# Patient Record
Sex: Female | Born: 1970
Health system: Southern US, Community
[De-identification: ages and names within clinical notes are randomized; demographics above are authoritative.]

## PROBLEM LIST (undated history)

## (undated) DIAGNOSIS — C801 Malignant (primary) neoplasm, unspecified: Secondary | ICD-10-CM

## (undated) HISTORY — PX: ABDOMINAL HYSTERECTOMY: SHX81

## (undated) HISTORY — PX: TUBAL LIGATION: SHX77

## (undated) HISTORY — PX: CHOLECYSTECTOMY: SHX55

## (undated) NOTE — *Deleted (*Deleted)
Patient Care Team: Leone Payor, MD as PCP - General (Internal Medicine) Pershing Proud, RN as Oncology Nurse Navigator Donnelly Angelica, RN as Oncology Nurse Navigator Emelia Loron, MD as Consulting Physician (General Surgery) Serena Croissant, MD as Consulting Physician (Hematology and Oncology)  DIAGNOSIS: No diagnosis found.  SUMMARY OF ONCOLOGIC HISTORY: Oncology History  Malignant neoplasm of upper-outer quadrant of right breast in female, estrogen receptor positive (HCC)  06/26/2018 Initial Diagnosis   [place]: Screening mammogram detected focal asymmetry in the right breast, ultrasound revealed 2 cm oval mass: Biopsy revealed grade 1-2 invasive ductal carcinoma, ER 3+, PR 3+, HER-2 3+ positive T1c M0 stage Ia clinical stage   07/23/2018 Cancer Staging   Staging form: Breast, AJCC 8th Edition - Clinical stage from 07/23/2018: Stage IA (cT1c, cN0, cM0, G2, ER+, PR+, HER2+)   07/2018 Genetic Testing   Genetic testing in [place], [place], negative   08/20/2018 Surgery   Right lumpectomy Dwain Sarna) [id number]): Grade 2 IDC with DCIS, 2.2 cm, margins negative, 1/1 lymph node positive with focal extranodal extension, ER 50%, PR 50%, HER-2 positive, 3+, Ki-67 10 to 15%, T2N1   09/03/2018 Cancer Staging   Staging form: Breast, AJCC 8th Edition - Pathologic: Stage IB (pT2, pN1a, cM0, G2, ER+, PR+, HER2+) - Signed by Loa Socks, NP on 09/03/2018   10/04/2018 - 05/22/2019 Chemotherapy   palonosetron (ALOXI) injection 0.25 mg, 0.25 mg, Intravenous,  Once, 6 of 6 cycles Administration: 0.25 mg (10/15/2018), 0.25 mg (11/05/2018), 0.25 mg (12/17/2018), 0.25 mg (01/07/2019), 0.25 mg (11/26/2018), 0.25 mg (09/24/2018)  pegfilgrastim-cbqv (UDENYCA) injection 6 mg, 6 mg, Subcutaneous, Once, 6 of 6 cycles. Administration: 6 mg (09/25/2018), 6 mg (10/17/2018), 6 mg (11/06/2018), 6 mg (12/19/2018), 6 mg (01/08/2019), 6 mg (11/28/2018)  trastuzumab (HERCEPTIN) 504 mg in sodium chloride  0.9 % 250 mL chemo infusion, 8 mg/kg = 504 mg, Intravenous,  Once, 9 of 14 cycles. Dose modification: 6 mg/kg (original dose 6 mg/kg, Cycle 7, Reason: Other (see comments), Comment: Insurance will only approve Herceptin at this time.), 6 mg/kg (original dose 6 mg/kg, Cycle 12, Reason: Other (see comments), Comment: pt did not tolerate Kanjinti (itching)). Administration: 378 mg (10/15/2018), 378 mg (11/05/2018), 504 mg (09/24/2018), 378 mg (01/28/2019), 378 mg (02/18/2019), 378 mg (03/11/2019), 378 mg (04/01/2019), 378 mg (05/22/2019)  CARBOplatin (PARAPLATIN) 700 mg in sodium chloride 0.9 % 250 mL chemo infusion, 700 mg (100 % of original dose 700 mg), Intravenous,  Once, 6 of 6 cycles. Dose modification: 700 mg (original dose 700 mg, Cycle 1). Administration: 700 mg (10/15/2018), 700 mg (11/05/2018), 700 mg (12/17/2018), 700 mg (01/07/2019), 700 mg (11/26/2018), 700 mg (09/24/2018)  DOCEtaxel (TAXOTERE) 130 mg in sodium chloride 0.9 % 250 mL chemo infusion, 75 mg/m2 = 130 mg, Intravenous,  Once, 6 of 6 cycles. Administration: 130 mg (10/15/2018), 130 mg (11/05/2018), 130 mg (12/17/2018), 130 mg (01/07/2019), 130 mg (11/26/2018), 130 mg (09/24/2018)  pertuzumab (PERJETA) 420 mg in sodium chloride 0.9 % 250 mL chemo infusion, 420 mg (50 % of original dose 840 mg), Intravenous, Once, 12 of 17 cycles. Dose modification: 420 mg (original dose 840 mg, Cycle 1, Reason: Provider Judgment). Administration: 420 mg (10/15/2018), 420 mg (11/05/2018), 420 mg (12/17/2018), 420 mg (01/07/2019), 420 mg (01/28/2019), 420 mg (03/11/2019), 420 mg (04/01/2019), 420 mg (11/26/2018), 420 mg (02/18/2019), 420 mg (04/24/2019), 420 mg (05/22/2019), 420 mg (09/24/2018)  fosaprepitant (EMEND) 150 mg   dexamethasone (DECADRON) 12 mg in sodium chloride 0.9 % 145 mL IVPB, ,  Intravenous,  Once, 6 of 6 cycles. Administration:  (10/15/2018),  (11/05/2018),  (12/17/2018),  (01/07/2019),  (11/26/2018),  (09/24/2018)  trastuzumab-dkst (OGIVRI) 378 mg in sodium chloride 0.9 %  250 mL chemo infusion, 6 mg/kg = 378 mg (100 % of original dose 6 mg/kg), Intravenous,  Once, 3 of 3 cycles. Dose modification: 6 mg/kg (original dose 6 mg/kg, Cycle 4, Reason: Other (see comments), Comment: Biosimilar Conversion). Administration: 378 mg (11/26/2018), 378 mg (12/17/2018), 378 mg (01/07/2019)     - 03/23/2019 Radiation Therapy   Completed at [facility]   03/2019 - 03/2024 Anti-estrogen oral therapy   Tamoxifen     CHIEF COMPLIANT: Follow-up of right breast canceron tamoxifen, neratinib  INTERVAL HISTORY: Megan Acevedo is a 72 y.o. with above-mentioned history of HER-2 positive right breast cancer who underwent alumpectomy,adjuvant chemotherapy,radiation,Herceptin Perjeta maintenance,andis currently onantiestrogen therapy with tamoxifenand neratinib.She presents to the clinic todayfora toxicity check.     ALLERGIES:  has No Known Allergies.  MEDICATIONS:  Current Outpatient Medications  Medication Sig Dispense Refill  . acetaminophen (TYLENOL) 325 MG tablet Take 650 mg by mouth every 6 (six) hours as needed.    . benzoyl peroxide-erythromycin (BENZAMYCIN) gel Apply topically 2 (two) times daily. 23.3 g 0  . chlorpheniramine (CHLOR-TRIMETON) 4 MG tablet Take 4 mg by mouth 2 (two) times daily as needed for allergies.    . diphenoxylate-atropine (LOMOTIL) 2.5-0.025 MG tablet Take 1 tablet by mouth 4 (four) times daily as needed for diarrhea or loose stools. 30 tablet 2  . LATISSE 0.03 % ophthalmic solution Place 1 application into both eyes at bedtime. Place one drop on applicator and apply evenly along the skin of the upper eyelid at base of eyelashes once daily at bedtime; repeat procedure for second eye (use a clean applicator). 3 mL 6  . loperamide (IMODIUM) 2 MG capsule Take 2 capsules (4 mg) by mouth three times daily on days 1-14, then 2 caps (4 mg) two times daily on days 15-56, then take as needed days 57-365 for diarrhea or loose stools 150 capsule 3  .  Multiple Vitamins-Minerals (CENTRUM SILVER 50+WOMEN) TABS Take 1 each by mouth every morning.    . Neratinib Maleate (NERLYNX) 40 MG tablet Take 2 tabs daily. Take with food. 180 tablet 1  . oxybutynin (DITROPAN) 5 MG tablet Take 5 mg by mouth 3 (three) times daily.    . potassium chloride SA (KLOR-CON) 20 MEQ tablet Take 1 tablet (20 mEq total) by mouth daily for 10 days. 10 tablet 0  . tamoxifen (NOLVADEX) 10 MG tablet Take 1 tablet (10 mg total) by mouth 2 (two) times daily. 60 tablet 3   No current facility-administered medications for this visit.    PHYSICAL EXAMINATION: ECOG PERFORMANCE STATUS: {CHL ONC ECOG PS:[phone]}  There were no vitals filed for this visit. There were no vitals filed for this visit.  LABORATORY DATA:  I have reviewed the data as listed CMP Latest Ref Rng & Units 12/23/2019 12/16/2019 12/02/2019  Glucose 70 - 99 mg/dL 77 89 76  BUN 6 - 20 mg/dL 13 12 13   Creatinine 0.44 - 1.00 mg/dL 4.09 8.11 9.14  Sodium 135 - 145 mmol/L 140 139 143  Potassium 3.5 - 5.1 mmol/L 4.1 3.3(L) 4.4  Chloride 98 - 111 mmol/L 107 103 109  CO2 22 - 32 mmol/L 26 29 26   Calcium 8.9 - 10.3 mg/dL 9.4 9.2 9.7  Total Protein 6.5 - 8.1 g/dL 7.1 7.1 7.3  Total Bilirubin 0.3 -  1.2 mg/dL 0.4 0.7 0.6  Alkaline Phos 38 - 126 U/L 73 73 73  AST 15 - 41 U/L 13(L) 21 15  ALT 0 - 44 U/L 22 43 16    Lab Results  Component Value Date   WBC 5.6 12/23/2019   HGB 13.1 12/23/2019   HCT 39.8 12/23/2019   MCV 95.7 12/23/2019   PLT 274 12/23/2019   NEUTROABS 3.2 12/23/2019    ASSESSMENT & PLAN:  No problem-specific Assessment & Plan notes found for this encounter.    No orders of the defined types were placed in this encounter.  The patient has a good understanding of the overall plan. she agrees with it. she will call with any problems that may develop before the next visit here.  Total time spent: *** mins including face to face time and time spent for planning, charting and coordination  of care  Serena Croissant, MD 01/31/2020  I, Kirt Boys Dorshimer, am acting as scribe for Dr. Serena Croissant.  {insert scribe attestation}

---

## 2018-04-09 DIAGNOSIS — C50919 Malignant neoplasm of unspecified site of unspecified female breast: Secondary | ICD-10-CM

## 2018-04-09 DIAGNOSIS — Z923 Personal history of irradiation: Secondary | ICD-10-CM

## 2018-04-09 DIAGNOSIS — Z9221 Personal history of antineoplastic chemotherapy: Secondary | ICD-10-CM

## 2018-04-09 HISTORY — DX: Personal history of antineoplastic chemotherapy: Z92.21

## 2018-04-09 HISTORY — DX: Malignant neoplasm of unspecified site of unspecified female breast: C50.919

## 2018-04-09 HISTORY — DX: Personal history of irradiation: Z92.3

## 2018-06-26 HISTORY — PX: BREAST BIOPSY: SHX20

## 2018-07-22 ENCOUNTER — Telehealth: Payer: Self-pay | Admitting: Hematology and Oncology

## 2018-07-22 NOTE — Telephone Encounter (Signed)
A new patient appt has been scheduled for Megan Acevedo to see Dr. Lindi Adie on 4/15 at 1:15pm. She's been made aware to arrive 30 minutes early.

## 2018-07-22 NOTE — Progress Notes (Signed)
[facility] CONSULT NOTE  No care team member to display  CHIEF COMPLAINTS/PURPOSE OF CONSULTATION: Newly diagnosed breast cancer  HISTORY OF PRESENTING ILLNESS:  Megan Acevedo 48 y.o. female is here because of recent diagnosis of invasive ductal carcinoma of the right breast. The cancer was detected on a screening mammogram on 06/09/18. A diagnostic mammogram and [place] on 06/18/18 showed a 2cm mass in the upper right breast at the 9 o'clock position. A biopsy on 06/26/18 showed the cancer to be grade 1-2 IDC, ER/PR positive, HER2 positive.   She presents to the clinic today to discuss treatment options.   I reviewed her records extensively and collaborated the history with the patient.  SUMMARY OF ONCOLOGIC HISTORY:   Malignant neoplasm of upper-outer quadrant of right breast in female, estrogen receptor positive ([place])   06/26/2018 Initial Diagnosis    [place]: Screening mammogram detected focal asymmetry in the right breast, ultrasound revealed 2 cm oval mass: Biopsy revealed grade 1-2 invasive ductal carcinoma, ER 3+, PR 3+, HER-2 3+ positive T1c M0 stage Ia clinical stage    07/23/2018 Cancer Staging    Staging form: Breast, AJCC 8th Edition - Clinical stage from 07/23/2018: Stage IA (cT1c, cN0, cM0, G2, ER+, PR+, HER2+) - Signed by Nicholas Lose, MD on 07/23/2018     MEDICAL HISTORY:  No chronic medical conditions SURGICAL HISTORY: Hysterectomy, cholecystectomy, tubal ligation SOCIAL HISTORY: 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  Her husband is a respiratory therapist at [employer].  She denies any tobacco alcohol or recreational drug use. FAMILY HISTORY: Mother had breast cancer in her 68s. Both father and mother are diabetic and hypertensive ALLERGIES:  has no allergies on file.  MEDICATIONS:  REVIEW OF SYSTEMS:   Constitutional: Denies fevers, chills or abnormal night sweats Eyes: Denies blurriness of vision, double vision or  watery eyes Ears, nose, mouth, throat, and face: Denies mucositis or sore throat Respiratory: Denies cough, dyspnea or wheezes Cardiovascular: Denies palpitation, chest discomfort or lower extremity swelling Gastrointestinal:  Denies nausea, heartburn or change in bowel habits Skin: Denies abnormal skin rashes Lymphatics: Denies new lymphadenopathy or easy bruising Neurological:Denies numbness, tingling or new weaknesses Behavioral/Psych: Mood is stable, no new changes  Breast: Denies any palpable lumps or discharge All other systems were reviewed with the patient and are negative.  PHYSICAL EXAMINATION: ECOG PERFORMANCE STATUS: 1 - Symptomatic but completely ambulatory  Vitals:   07/23/18 1326  BP: (!) 148/82  Pulse: 84  Resp: 18  Temp: 97.8 F (36.6 C)  SpO2: 100%   Filed Weights   07/23/18 1326  Weight: 138 lb (62.6 kg)    GENERAL:alert, no distress and comfortable SKIN: skin color, texture, turgor are normal, no rashes or significant lesions EYES: normal, conjunctiva are pink and non-injected, sclera clear OROPHARYNX:no exudate, no erythema and lips, buccal mucosa, and tongue normal  NECK: supple, thyroid normal size, non-tender, without nodularity LYMPH:  no palpable lymphadenopathy in the cervical, axillary or inguinal LUNGS: clear to auscultation and percussion with normal breathing effort HEART: regular rate & rhythm and no murmurs and no lower extremity edema ABDOMEN:abdomen soft, non-tender and normal bowel sounds Musculoskeletal:no cyanosis of digits and no clubbing  PSYCH: alert & oriented x 3 with fluent speech NEURO: no focal motor/sensory deficits  RADIOGRAPHIC STUDIES: I have personally reviewed the radiological reports and agreed with the findings in the report.  ASSESSMENT AND PLAN:  Malignant neoplasm of upper-outer quadrant of right breast in  female, estrogen receptor positive ([place]) 06/26/2018 [place]: Screening mammogram detected focal  asymmetry in the right breast, ultrasound revealed 2 cm oval mass: Biopsy revealed grade 1-2 invasive ductal carcinoma, ER 3+, PR 3+, HER-2 3+ positive T1c M0 stage Ia clinical stage  Pathology and radiology counseling: Discussed with the patient, the details of pathology including the type of breast cancer,the clinical staging, the significance of ER, PR and HER-2/neu receptors and the implications for treatment. After reviewing the pathology in detail, we proceeded to discuss the different treatment options between surgery, radiation, chemotherapy, antiestrogen therapies.  Recommendation: 1.  Genetic counseling and testing: Done at [facility] were apparently negative 2.  Breast conserving surgery with sentinel lymph node biopsy 3.  Adjuvant chemotherapy decision based upon tumor size and nodal characteristics 4.  Adjuvant radiation therapy 5.  Follow-up adjuvant antiestrogen therapy with tamoxifen x10 years  I briefly discussed different chemotherapy regimens depending on the tumor size and lymph node involvement.  We will finalize a treatment after reviewing the final pathology report. Patient requested a referral to see one of our surgeons.  I sent a message to Dr. Donne Hazel for consultation. I discussed with the patient the surgery was to be delayed then we can start her on tamoxifen.  All questions were answered. The patient knows to call the clinic with any problems, questions or concerns.   Rulon Eisenmenger, MD 07/23/2018   I, Molly Dorshimer, am acting as scribe for Nicholas Lose, MD.  I have reviewed the above documentation for accuracy and completeness, and I agree with the above.

## 2018-07-23 ENCOUNTER — Inpatient Hospital Stay: Payer: BLUE CROSS/BLUE SHIELD | Attending: Hematology and Oncology | Admitting: Hematology and Oncology

## 2018-07-23 ENCOUNTER — Other Ambulatory Visit: Payer: Self-pay

## 2018-07-23 DIAGNOSIS — Z803 Family history of malignant neoplasm of breast: Secondary | ICD-10-CM | POA: Diagnosis not present

## 2018-07-23 DIAGNOSIS — Z79899 Other long term (current) drug therapy: Secondary | ICD-10-CM | POA: Diagnosis not present

## 2018-07-23 DIAGNOSIS — Z170 Estrogen receptor positive status [ER+]: Secondary | ICD-10-CM | POA: Diagnosis not present

## 2018-07-23 DIAGNOSIS — C50411 Malignant neoplasm of upper-outer quadrant of right female breast: Secondary | ICD-10-CM | POA: Diagnosis not present

## 2018-07-23 NOTE — Assessment & Plan Note (Signed)
06/26/2018 [place]: Screening mammogram detected focal asymmetry in the right breast, ultrasound revealed 2 cm oval mass: Biopsy revealed grade 1-2 invasive ductal carcinoma, ER 3+, PR 3+, HER-2 3+ positive T1c M0 stage Ia clinical stage  Pathology and radiology counseling: Discussed with the patient, the details of pathology including the type of breast cancer,the clinical staging, the significance of ER, PR and HER-2/neu receptors and the implications for treatment. After reviewing the pathology in detail, we proceeded to discuss the different treatment options between surgery, radiation, chemotherapy, antiestrogen therapies.  Recommendation: 1.  Genetic counseling and testing 2.  Breast conserving surgery with sentinel lymph node biopsy 3.  Adjuvant chemotherapy decision based upon tumor size and nodal characteristics 4.  Adjuvant radiation therapy 5.  Follow-up adjuvant antiestrogen therapy  I briefly discussed different chemotherapy regimens depending on the tumor size and lymph node involvement.  We will finalize a treatment after reviewing the final pathology report.

## 2018-07-24 ENCOUNTER — Encounter: Payer: Self-pay | Admitting: *Deleted

## 2018-07-25 ENCOUNTER — Telehealth: Payer: Self-pay | Admitting: *Deleted

## 2018-07-25 NOTE — Telephone Encounter (Signed)
Left vm with contact information for questions or needs regarding tx care plan.

## 2018-07-28 ENCOUNTER — Inpatient Hospital Stay: Payer: BLUE CROSS/BLUE SHIELD

## 2018-07-28 ENCOUNTER — Other Ambulatory Visit (HOSPITAL_COMMUNITY): Payer: BLUE CROSS/BLUE SHIELD

## 2018-07-29 ENCOUNTER — Other Ambulatory Visit: Payer: Self-pay | Admitting: Hematology and Oncology

## 2018-07-30 ENCOUNTER — Telehealth: Payer: Self-pay | Admitting: *Deleted

## 2018-07-30 NOTE — Telephone Encounter (Signed)
Pt called regarding echo. Pt to have echo in [place]. Informed pt to have office fax report to our office. Received verbal understanding.

## 2018-08-04 ENCOUNTER — Encounter: Payer: Self-pay | Admitting: Hematology and Oncology

## 2018-08-04 ENCOUNTER — Encounter: Payer: Self-pay | Admitting: *Deleted

## 2018-08-04 NOTE — Progress Notes (Signed)
On 4/16 slides were requested from [facility], to be sent to Dr. Lyndon Code @ [facility],  Faxed request to [phone], confirmation received,

## 2018-08-05 ENCOUNTER — Other Ambulatory Visit: Payer: Self-pay | Admitting: General Surgery

## 2018-08-05 DIAGNOSIS — Z170 Estrogen receptor positive status [ER+]: Principal | ICD-10-CM

## 2018-08-05 DIAGNOSIS — C50411 Malignant neoplasm of upper-outer quadrant of right female breast: Secondary | ICD-10-CM

## 2018-08-06 ENCOUNTER — Other Ambulatory Visit: Payer: Self-pay | Admitting: General Surgery

## 2018-08-06 DIAGNOSIS — C50411 Malignant neoplasm of upper-outer quadrant of right female breast: Secondary | ICD-10-CM

## 2018-08-06 DIAGNOSIS — Z170 Estrogen receptor positive status [ER+]: Principal | ICD-10-CM

## 2018-08-07 ENCOUNTER — Encounter: Payer: Self-pay | Admitting: *Deleted

## 2018-08-14 ENCOUNTER — Other Ambulatory Visit: Payer: Self-pay | Admitting: General Surgery

## 2018-08-14 ENCOUNTER — Other Ambulatory Visit: Payer: Self-pay

## 2018-08-14 ENCOUNTER — Encounter (HOSPITAL_BASED_OUTPATIENT_CLINIC_OR_DEPARTMENT_OTHER): Payer: Self-pay | Admitting: *Deleted

## 2018-08-18 ENCOUNTER — Other Ambulatory Visit: Payer: Self-pay

## 2018-08-18 ENCOUNTER — Other Ambulatory Visit (HOSPITAL_COMMUNITY)
Admission: RE | Admit: 2018-08-18 | Discharge: 2018-08-18 | Disposition: A | Discharge status: Home / Self Care | Payer: BLUE CROSS/BLUE SHIELD | Source: Ambulatory Visit | Attending: General Surgery | Admitting: General Surgery

## 2018-08-18 ENCOUNTER — Ambulatory Visit
Admission: RE | Admit: 2018-08-18 | Discharge: 2018-08-18 | Disposition: A | Discharge status: Home / Self Care | Payer: BLUE CROSS/BLUE SHIELD | Source: Ambulatory Visit | Attending: General Surgery | Admitting: General Surgery

## 2018-08-18 ENCOUNTER — Other Ambulatory Visit: Payer: Self-pay | Admitting: General Surgery

## 2018-08-18 DIAGNOSIS — Z1159 Encounter for screening for other viral diseases: Secondary | ICD-10-CM | POA: Insufficient documentation

## 2018-08-18 DIAGNOSIS — Z170 Estrogen receptor positive status [ER+]: Secondary | ICD-10-CM

## 2018-08-18 DIAGNOSIS — C50411 Malignant neoplasm of upper-outer quadrant of right female breast: Secondary | ICD-10-CM

## 2018-08-18 NOTE — Progress Notes (Signed)
Ensure pre surgery drink given with instructions to complete by 0900 dos, surgical soap given with instructions, pt verbalized understanding.

## 2018-08-19 LAB — NOVEL CORONAVIRUS, NAA (HOSP ORDER, SEND-OUT TO REF LAB; TAT 18-24 HRS): SARS-CoV-2, NAA: NOT DETECTED

## 2018-08-20 ENCOUNTER — Encounter (HOSPITAL_BASED_OUTPATIENT_CLINIC_OR_DEPARTMENT_OTHER)
Admission: RE | Disposition: A | Discharge status: Home / Self Care | Payer: Self-pay | Source: Home / Self Care | Attending: General Surgery

## 2018-08-20 ENCOUNTER — Ambulatory Visit (HOSPITAL_COMMUNITY)
Admission: RE | Admit: 2018-08-20 | Discharge: 2018-08-20 | Disposition: A | Discharge status: Home / Self Care | Payer: BLUE CROSS/BLUE SHIELD | Source: Ambulatory Visit | Attending: General Surgery | Admitting: General Surgery

## 2018-08-20 ENCOUNTER — Ambulatory Visit
Admission: RE | Admit: 2018-08-20 | Discharge: 2018-08-20 | Disposition: A | Discharge status: Home / Self Care | Payer: BLUE CROSS/BLUE SHIELD | Source: Ambulatory Visit | Attending: General Surgery | Admitting: General Surgery

## 2018-08-20 ENCOUNTER — Encounter (HOSPITAL_BASED_OUTPATIENT_CLINIC_OR_DEPARTMENT_OTHER): Payer: Self-pay | Admitting: *Deleted

## 2018-08-20 ENCOUNTER — Encounter (HOSPITAL_COMMUNITY): Payer: BLUE CROSS/BLUE SHIELD

## 2018-08-20 ENCOUNTER — Ambulatory Visit (HOSPITAL_BASED_OUTPATIENT_CLINIC_OR_DEPARTMENT_OTHER): Payer: BLUE CROSS/BLUE SHIELD | Admitting: Anesthesiology

## 2018-08-20 ENCOUNTER — Ambulatory Visit (HOSPITAL_COMMUNITY): Payer: BLUE CROSS/BLUE SHIELD

## 2018-08-20 ENCOUNTER — Other Ambulatory Visit: Payer: Self-pay

## 2018-08-20 ENCOUNTER — Ambulatory Visit (HOSPITAL_BASED_OUTPATIENT_CLINIC_OR_DEPARTMENT_OTHER)
Admission: RE | Admit: 2018-08-20 | Discharge: 2018-08-20 | Disposition: A | Discharge status: Home / Self Care | Payer: BLUE CROSS/BLUE SHIELD | Attending: General Surgery | Admitting: General Surgery

## 2018-08-20 DIAGNOSIS — Z170 Estrogen receptor positive status [ER+]: Secondary | ICD-10-CM | POA: Diagnosis not present

## 2018-08-20 DIAGNOSIS — C50911 Malignant neoplasm of unspecified site of right female breast: Secondary | ICD-10-CM | POA: Insufficient documentation

## 2018-08-20 DIAGNOSIS — C773 Secondary and unspecified malignant neoplasm of axilla and upper limb lymph nodes: Secondary | ICD-10-CM | POA: Diagnosis not present

## 2018-08-20 DIAGNOSIS — Z79899 Other long term (current) drug therapy: Secondary | ICD-10-CM | POA: Insufficient documentation

## 2018-08-20 DIAGNOSIS — Z95828 Presence of other vascular implants and grafts: Secondary | ICD-10-CM

## 2018-08-20 DIAGNOSIS — C50411 Malignant neoplasm of upper-outer quadrant of right female breast: Secondary | ICD-10-CM

## 2018-08-20 DIAGNOSIS — Z803 Family history of malignant neoplasm of breast: Secondary | ICD-10-CM | POA: Diagnosis not present

## 2018-08-20 HISTORY — PX: PORTACATH PLACEMENT: SHX2246

## 2018-08-20 HISTORY — PX: BREAST LUMPECTOMY: SHX2

## 2018-08-20 HISTORY — PX: BREAST LUMPECTOMY WITH RADIOACTIVE SEED AND SENTINEL LYMPH NODE BIOPSY: SHX6550

## 2018-08-20 HISTORY — DX: Malignant (primary) neoplasm, unspecified: C80.1

## 2018-08-20 SURGERY — BREAST LUMPECTOMY WITH RADIOACTIVE SEED AND SENTINEL LYMPH NODE BIOPSY
Anesthesia: Regional | Site: Chest | Laterality: Right

## 2018-08-20 MED ORDER — FENTANYL CITRATE (PF) 100 MCG/2ML IJ SOLN: 50.0000 ug | INTRAMUSCULAR | Status: DC | PRN

## 2018-08-20 MED ORDER — PROMETHAZINE HCL 25 MG/ML IJ SOLN
6.2500 mg | INTRAMUSCULAR | Status: DC | PRN
  Administered 2018-08-20: 6.25 mg via INTRAVENOUS

## 2018-08-20 MED ORDER — CEFAZOLIN SODIUM-DEXTROSE 2-4 GM/100ML-% IV SOLN
INTRAVENOUS | Status: AC
  Filled 2018-08-20: qty 100

## 2018-08-20 MED ORDER — TECHNETIUM TC 99M SULFUR COLLOID FILTERED
1.0000 | Freq: Once | INTRAVENOUS | Status: AC | PRN
  Administered 2018-08-20: 11:00:00 1 via INTRADERMAL

## 2018-08-20 MED ORDER — ACETAMINOPHEN 500 MG PO TABS
ORAL_TABLET | ORAL | Status: AC
  Filled 2018-08-20: qty 1

## 2018-08-20 MED ORDER — FENTANYL CITRATE (PF) 100 MCG/2ML IJ SOLN
INTRAMUSCULAR | Status: DC | PRN
  Administered 2018-08-20 (×2): 50 ug via INTRAVENOUS

## 2018-08-20 MED ORDER — OXYCODONE HCL 5 MG/5ML PO SOLN: 5.0000 mg | Freq: Once | ORAL | Status: DC | PRN

## 2018-08-20 MED ORDER — PHENYLEPHRINE 40 MCG/ML (10ML) SYRINGE FOR IV PUSH (FOR BLOOD PRESSURE SUPPORT)
PREFILLED_SYRINGE | INTRAVENOUS | Status: DC | PRN
  Administered 2018-08-20: 40 ug via INTRAVENOUS
  Administered 2018-08-20 (×4): 80 ug via INTRAVENOUS
  Administered 2018-08-20: 40 ug via INTRAVENOUS
  Administered 2018-08-20 (×3): 80 ug via INTRAVENOUS

## 2018-08-20 MED ORDER — DEXAMETHASONE SODIUM PHOSPHATE 10 MG/ML IJ SOLN
INTRAMUSCULAR | Status: AC
  Filled 2018-08-20: qty 1

## 2018-08-20 MED ORDER — DEXAMETHASONE SODIUM PHOSPHATE 10 MG/ML IJ SOLN
INTRAMUSCULAR | Status: DC | PRN
  Administered 2018-08-20: 5 mg via INTRAVENOUS

## 2018-08-20 MED ORDER — KETOROLAC TROMETHAMINE 15 MG/ML IJ SOLN
INTRAMUSCULAR | Status: AC
  Filled 2018-08-20: qty 1

## 2018-08-20 MED ORDER — ONDANSETRON HCL 4 MG/2ML IJ SOLN
INTRAMUSCULAR | Status: DC | PRN
  Administered 2018-08-20: 4 mg via INTRAVENOUS

## 2018-08-20 MED ORDER — GABAPENTIN 100 MG PO CAPS
100.0000 mg | ORAL_CAPSULE | ORAL | Status: AC
  Administered 2018-08-20: 09:00:00 100 mg via ORAL

## 2018-08-20 MED ORDER — MIDAZOLAM HCL 2 MG/2ML IJ SOLN
INTRAMUSCULAR | Status: AC
  Filled 2018-08-20: qty 2

## 2018-08-20 MED ORDER — GABAPENTIN 100 MG PO CAPS
ORAL_CAPSULE | ORAL | Status: AC
  Filled 2018-08-20: qty 1

## 2018-08-20 MED ORDER — FENTANYL CITRATE (PF) 100 MCG/2ML IJ SOLN
INTRAMUSCULAR | Status: AC
  Filled 2018-08-20: qty 2

## 2018-08-20 MED ORDER — ONDANSETRON HCL 4 MG/2ML IJ SOLN
INTRAMUSCULAR | Status: AC
  Filled 2018-08-20: qty 2

## 2018-08-20 MED ORDER — LACTATED RINGERS IV SOLN
INTRAVENOUS | Status: DC
  Administered 2018-08-20 (×2): via INTRAVENOUS

## 2018-08-20 MED ORDER — KETOROLAC TROMETHAMINE 30 MG/ML IJ SOLN: 30.0000 mg | Freq: Once | INTRAMUSCULAR | Status: DC | PRN

## 2018-08-20 MED ORDER — ENSURE PRE-SURGERY PO LIQD: 296.0000 mL | Freq: Once | ORAL | Status: DC

## 2018-08-20 MED ORDER — OXYCODONE HCL 5 MG PO TABS: 5.0000 mg | ORAL_TABLET | Freq: Once | ORAL | Status: DC | PRN

## 2018-08-20 MED ORDER — ROPIVACAINE HCL 5 MG/ML IJ SOLN
INTRAMUSCULAR | Status: DC | PRN
  Administered 2018-08-20: 30 mL via PERINEURAL

## 2018-08-20 MED ORDER — HEPARIN SOD (PORK) LOCK FLUSH 100 UNIT/ML IV SOLN
INTRAVENOUS | Status: DC | PRN
  Administered 2018-08-20: 500 [IU] via INTRAVENOUS

## 2018-08-20 MED ORDER — ACETAMINOPHEN 500 MG PO TABS
1000.0000 mg | ORAL_TABLET | ORAL | Status: AC
  Administered 2018-08-20: 1000 mg via ORAL

## 2018-08-20 MED ORDER — SODIUM CHLORIDE (PF) 0.9 % IJ SOLN
INTRAMUSCULAR | Status: AC
  Filled 2018-08-20: qty 10

## 2018-08-20 MED ORDER — CHLORHEXIDINE GLUCONATE CLOTH 2 % EX PADS: 6.0000 | MEDICATED_PAD | Freq: Once | CUTANEOUS | Status: DC

## 2018-08-20 MED ORDER — HYDROMORPHONE HCL 1 MG/ML IJ SOLN: 0.2500 mg | INTRAMUSCULAR | Status: DC | PRN

## 2018-08-20 MED ORDER — PROMETHAZINE HCL 25 MG/ML IJ SOLN
INTRAMUSCULAR | Status: AC
  Filled 2018-08-20: qty 1

## 2018-08-20 MED ORDER — CEFAZOLIN SODIUM-DEXTROSE 2-4 GM/100ML-% IV SOLN
2.0000 g | INTRAVENOUS | Status: AC
  Administered 2018-08-20: 12:00:00 2 g via INTRAVENOUS

## 2018-08-20 MED ORDER — MIDAZOLAM HCL 2 MG/2ML IJ SOLN
1.0000 mg | INTRAMUSCULAR | Status: DC | PRN
  Administered 2018-08-20: 2 mg via INTRAVENOUS

## 2018-08-20 MED ORDER — HEPARIN (PORCINE) IN NACL 2-0.9 UNITS/ML
INTRAMUSCULAR | Status: AC | PRN
  Administered 2018-08-20: 1

## 2018-08-20 MED ORDER — ACETAMINOPHEN 500 MG PO TABS
ORAL_TABLET | ORAL | Status: AC
  Filled 2018-08-20: qty 2

## 2018-08-20 MED ORDER — SCOPOLAMINE 1 MG/3DAYS TD PT72: 1.0000 | MEDICATED_PATCH | Freq: Once | TRANSDERMAL | Status: DC | PRN

## 2018-08-20 MED ORDER — PROPOFOL 10 MG/ML IV BOLUS
INTRAVENOUS | Status: DC | PRN
  Administered 2018-08-20: 200 mg via INTRAVENOUS

## 2018-08-20 MED ORDER — LIDOCAINE 2% (20 MG/ML) 5 ML SYRINGE
INTRAMUSCULAR | Status: AC
  Filled 2018-08-20: qty 5

## 2018-08-20 MED ORDER — KETOROLAC TROMETHAMINE 15 MG/ML IJ SOLN
15.0000 mg | INTRAMUSCULAR | Status: DC
  Administered 2018-08-20: 15 mg via INTRAVENOUS

## 2018-08-20 MED ORDER — OXYCODONE HCL 5 MG PO TABS: 5.0000 mg | ORAL_TABLET | Freq: Four times a day (QID) | ORAL | 0 refills | Status: DC | PRN

## 2018-08-20 MED ORDER — LIDOCAINE 2% (20 MG/ML) 5 ML SYRINGE
INTRAMUSCULAR | Status: DC | PRN
  Administered 2018-08-20: 60 mg via INTRAVENOUS

## 2018-08-20 SURGICAL SUPPLY — 70 items
APPLIER CLIP 9.375 MED OPEN (MISCELLANEOUS) ×3
BAG DECANTER FOR FLEXI CONT (MISCELLANEOUS) ×3 IMPLANT
BENZOIN TINCTURE PRP APPL 2/3 (GAUZE/BANDAGES/DRESSINGS) ×3 IMPLANT
BINDER BREAST LRG (GAUZE/BANDAGES/DRESSINGS) IMPLANT
BINDER BREAST MEDIUM (GAUZE/BANDAGES/DRESSINGS) ×3 IMPLANT
BINDER BREAST XLRG (GAUZE/BANDAGES/DRESSINGS) IMPLANT
BINDER BREAST XXLRG (GAUZE/BANDAGES/DRESSINGS) IMPLANT
BLADE SURG 11 STRL SS (BLADE) ×3 IMPLANT
BLADE SURG 15 STRL LF DISP TIS (BLADE) ×2 IMPLANT
BLADE SURG 15 STRL SS (BLADE) ×1
CANISTER SUC SOCK COL 7IN (MISCELLANEOUS) IMPLANT
CANISTER SUCT 1200ML W/VALVE (MISCELLANEOUS) ×3 IMPLANT
CHLORAPREP W/TINT 26 (MISCELLANEOUS) ×6 IMPLANT
CLIP APPLIE 9.375 MED OPEN (MISCELLANEOUS) ×2 IMPLANT
CLIP VESOCCLUDE SM WIDE 6/CT (CLIP) ×3 IMPLANT
COVER BACK TABLE REUSABLE LG (DRAPES) ×3 IMPLANT
COVER MAYO STAND REUSABLE (DRAPES) ×3 IMPLANT
COVER PROBE 5X48 (MISCELLANEOUS) ×1
COVER PROBE W GEL 5X96 (DRAPES) ×6 IMPLANT
COVER WAND RF STERILE (DRAPES) IMPLANT
DECANTER SPIKE VIAL GLASS SM (MISCELLANEOUS) IMPLANT
DERMABOND ADVANCED (GAUZE/BANDAGES/DRESSINGS) ×1
DERMABOND ADVANCED .7 DNX12 (GAUZE/BANDAGES/DRESSINGS) ×2 IMPLANT
DRAPE C-ARM 42X72 X-RAY (DRAPES) ×3 IMPLANT
DRAPE LAPAROSCOPIC ABDOMINAL (DRAPES) ×6 IMPLANT
DRAPE UTILITY XL STRL (DRAPES) ×6 IMPLANT
DRSG TEGADERM 4X4.75 (GAUZE/BANDAGES/DRESSINGS) IMPLANT
ELECT COATED BLADE 2.86 ST (ELECTRODE) ×3 IMPLANT
ELECT REM PT RETURN 9FT ADLT (ELECTROSURGICAL) ×3
ELECTRODE REM PT RTRN 9FT ADLT (ELECTROSURGICAL) ×2 IMPLANT
GAUZE SPONGE 4X4 12PLY STRL LF (GAUZE/BANDAGES/DRESSINGS) ×3 IMPLANT
GLOVE BIO SURGEON STRL SZ7 (GLOVE) ×6 IMPLANT
GLOVE BIOGEL PI IND STRL 7.5 (GLOVE) ×2 IMPLANT
GLOVE BIOGEL PI INDICATOR 7.5 (GLOVE) ×1
GOWN STRL REUS W/ TWL LRG LVL3 (GOWN DISPOSABLE) ×4 IMPLANT
GOWN STRL REUS W/TWL LRG LVL3 (GOWN DISPOSABLE) ×2
HEMOSTAT ARISTA ABSORB 3G PWDR (HEMOSTASIS) ×3 IMPLANT
ILLUMINATOR WAVEGUIDE N/F (MISCELLANEOUS) ×3 IMPLANT
IV KIT MINILOC 20X1 SAFETY (NEEDLE) IMPLANT
KIT CVR 48X5XPRB PLUP LF (MISCELLANEOUS) ×2 IMPLANT
KIT MARKER MARGIN INK (KITS) ×3 IMPLANT
KIT PORT POWER 8FR ISP CVUE (Port) ×3 IMPLANT
LIGHT WAVEGUIDE WIDE FLAT (MISCELLANEOUS) IMPLANT
NDL SAFETY ECLIPSE 18X1.5 (NEEDLE) IMPLANT
NEEDLE HYPO 18GX1.5 SHARP (NEEDLE)
NEEDLE HYPO 25X1 1.5 SAFETY (NEEDLE) ×3 IMPLANT
NS IRRIG 1000ML POUR BTL (IV SOLUTION) ×3 IMPLANT
PACK BASIN DAY SURGERY FS (CUSTOM PROCEDURE TRAY) ×3 IMPLANT
PENCIL BUTTON HOLSTER BLD 10FT (ELECTRODE) ×3 IMPLANT
SLEEVE SCD COMPRESS KNEE MED (MISCELLANEOUS) ×3 IMPLANT
SPONGE LAP 4X18 RFD (DISPOSABLE) ×6 IMPLANT
STRIP CLOSURE SKIN 1/2X4 (GAUZE/BANDAGES/DRESSINGS) ×3 IMPLANT
SUT ETHILON 2 0 FS 18 (SUTURE) IMPLANT
SUT MNCRL AB 4-0 PS2 18 (SUTURE) ×9 IMPLANT
SUT MON AB 5-0 PS2 18 (SUTURE) ×3 IMPLANT
SUT PROLENE 2 0 SH DA (SUTURE) ×3 IMPLANT
SUT SILK 2 0 SH (SUTURE) IMPLANT
SUT SILK 2 0 TIES 17X18 (SUTURE)
SUT SILK 2-0 18XBRD TIE BLK (SUTURE) IMPLANT
SUT VIC AB 2-0 SH 27 (SUTURE) ×2
SUT VIC AB 2-0 SH 27XBRD (SUTURE) ×4 IMPLANT
SUT VIC AB 3-0 SH 27 (SUTURE) ×2
SUT VIC AB 3-0 SH 27X BRD (SUTURE) ×4 IMPLANT
SUT VIC AB 5-0 PS2 18 (SUTURE) IMPLANT
SYR 5ML LUER SLIP (SYRINGE) ×3 IMPLANT
SYR CONTROL 10ML LL (SYRINGE) ×3 IMPLANT
TOWEL GREEN STERILE FF (TOWEL DISPOSABLE) ×3 IMPLANT
TRAY FAXITRON CT DISP (TRAY / TRAY PROCEDURE) ×3 IMPLANT
TUBE CONNECTING 20X1/4 (TUBING) ×3 IMPLANT
YANKAUER SUCT BULB TIP NO VENT (SUCTIONS) ×3 IMPLANT

## 2018-08-20 NOTE — Anesthesia Procedure Notes (Signed)
Procedure Name: LMA Insertion Date/Time: 08/20/2018 11:43 AM Performed by: Gwyndolyn Saxon, CRNA Pre-anesthesia Checklist: Patient identified, Emergency Drugs available, Suction available and Patient being monitored Patient Re-evaluated:Patient Re-evaluated prior to induction Oxygen Delivery Method: Circle system utilized Preoxygenation: Pre-oxygenation with 100% oxygen Induction Type: IV induction Ventilation: Mask ventilation without difficulty LMA: LMA inserted LMA Size: 4.0 Number of attempts: 1 Placement Confirmation: positive ETCO2 and breath sounds checked- equal and bilateral Tube secured with: Tape Dental Injury: Teeth and Oropharynx as per pre-operative assessment

## 2018-08-20 NOTE — Anesthesia Postprocedure Evaluation (Signed)
Anesthesia Post Note  Patient: Megan Acevedo  Procedure(s) Performed: RIGHT BREAST LUMPECTOMY WITH RADIOACTIVE SEED AND RIGHT AXILLARY SENTINEL LYMPH NODE BIOPSY (Right Breast) INSERTION PORT-A-CATH WITH ULTRASOUND (Right Chest)     Patient location during evaluation: PACU Anesthesia Type: Regional and General Level of consciousness: awake and alert Pain management: pain level controlled Vital Signs Assessment: post-procedure vital signs reviewed and stable Respiratory status: spontaneous breathing, nonlabored ventilation, respiratory function stable and patient connected to nasal cannula oxygen Cardiovascular status: blood pressure returned to baseline and stable Postop Assessment: no apparent nausea or vomiting Anesthetic complications: no    Last Vitals:  Vitals:   08/20/18 1415 08/20/18 1436  BP: 135/79 (!) 148/84  Pulse: 79 88  Resp: 18 18  Temp:  (!) 36.3 C  SpO2: 100% 100%    Last Pain:  Vitals:   08/20/18 1436  TempSrc:   PainSc: 0-No pain                 Caelan Branden P [name]

## 2018-08-20 NOTE — Interval H&P Note (Signed)
History and Physical Interval Note:  08/20/2018 11:20 AM  Megan Acevedo  has presented today for surgery, with the diagnosis of RIGHT BREAST CANCER.  The various methods of treatment have been discussed with the patient and family. After consideration of risks, benefits and other options for treatment, the patient has consented to  Procedure(s): RIGHT BREAST LUMPECTOMY WITH RADIOACTIVE SEED AND RIGHT AXILLARY SENTINEL LYMPH NODE BIOPSY (Right) INSERTION PORT-A-CATH WITH ULTRASOUND (N/A) as a surgical intervention.  The patient's history has been reviewed, patient examined, no change in status, stable for surgery.  I have reviewed the patient's chart and labs.  Questions were answered to the patient's satisfaction.     [name]

## 2018-08-20 NOTE — Op Note (Signed)
Preoperative diagnosis: Clinical stage I HER-2 positive right breast cancer Postoperative diagnosis: Same as above Procedure: 1.  Right internal jugular port placement with ultrasound 2.  Right breast radioactive seed guided lumpectomy 3.  Right deep axillary sentinel lymph node biopsy Surgeon: Dr. Serita Grammes Anesthesia: General with pectoral block Estimated blood loss: 20 cc Specimens: 1.  Right breast tissue containing radioactive seed and clip 2.  Right deep axillary sentinel lymph nodes with highest count of 671 Complications: None Drains: None Sponge and needle count was correct at completion Disposition to recovery in stable condition  Indications: This a 48 year old female who has a newly diagnosed clinical stage I HER-2 positive right breast cancer.  We have discussed all of her options and she has been seen by medical oncology.  We elected to proceed with breast conservation therapy as well as a port placement for chemotherapy and anti-HER-2 therapy.  She had a radioactive seed placed in the tumor prior to beginning I had these mammograms.  Procedure: After informed consent was obtained the patient first underwent a pectoral block.  She was injected with technetium in the standard periareolar fashion.  She had SCDs on.  Antibiotics were given.  She was placed under general anesthesia without complication.  She was prepped and draped for the port in the standard sterile surgical fashion.  A surgical timeout was then performed.  I placed a port in the right internal jugular vein.  I used the ultrasound to identify the vein.  I accessed this on the first pass.  The wire was placed.  The wire was confirmed to be in good position both by fluoroscopy and by ultrasound.  I then created a pocket below the clavicle after making an incision.  I tunneled between the 2 sites and passed the line.  I then placed the dilator under fluoroscopic guidance and remove the wire assembly.  I then placed  the line through the sheath.  The sheath was then removed.  The line was pulled back to be in the vena cava.  I then attached this to the port.  This was sewn into place with 2-0 Prolene in 2 places.  This flushed easily and aspirated blood.  Heparin was packed in it.  I then closed this with 3-0 Vicryl, 4-0 Monocryl, and glue.  Final fluoroscopy showed the port and the line all be in good position without any kinks.  I then broke this set down and we reprepped and draped her for the cancer surgery.  Another timeout was performed.  The radioactive seed was in the 9 o'clock position of the right breast.  I filtrated Marcaine around the areola.  I then made a periareolar incision in order to hide the scar later.  I dissected down to the radioactive seed.  I then remove the seed and the surrounding tissue with an attempt to get a clear margin.  The tissue was very dense and I really could not tell anything clinically.  I then remove this and took a mammogram picture of it.  This confirmed removal of the seed and the clip.  The 3D images look like the cancer was right in the center of my specimen so I did not take additional margins.  The deep margin is the muscle.  I then placed clips in the cavity.  That closed the breast tissue completely down with 2-0 Vicryl suture.  Skin was closed with 3-0 Vicryl and 5-0 Monocryl.  Glue and Steri-Strips were applied.  I identified  the sentinel nodes in the axilla.  I made a curvilinear incision below the hairline after infiltrating with local anesthetic.  I took this through the axillary fascia.  I then removed what are likely 2 or 3 small lymph nodes that were radioactive.  There was really no more background radioactivity.  There was some bleeding from a small vein that I oversewed and placed a couple of clips on.  Hemostasis was then observed.  I then closed this with 2-0 Vicryl, 3-0 Vicryl, and 4-0 Monocryl.  Glue and Steri-Strips were applied.  She tolerated this well was  extubated and transferred to recovery stable.

## 2018-08-20 NOTE — Anesthesia Preprocedure Evaluation (Addendum)
Anesthesia Evaluation  Patient identified by MRN, date of birth, ID band Patient awake    Reviewed: Allergy & Precautions, NPO status , Patient's Chart, lab work & pertinent test results  Airway Mallampati: I  TM Distance: >3 FB Neck ROM: Full    Dental no notable dental hx.    Pulmonary neg pulmonary ROS,    Pulmonary exam normal breath sounds clear to auscultation       Cardiovascular negative cardio ROS Normal cardiovascular exam Rhythm:Regular Rate:Normal     Neuro/Psych negative neurological ROS  negative psych ROS   GI/Hepatic Neg liver ROS, GERD  Medicated and Controlled,  Endo/Other  negative endocrine ROS  Renal/GU negative Renal ROS     Musculoskeletal negative musculoskeletal ROS (+)   Abdominal   Peds  Hematology negative hematology ROS (+)   Anesthesia Other Findings RIGHT BREAST CANCER  Reproductive/Obstetrics S/p BTL                            Anesthesia Physical Anesthesia Plan  ASA: II  Anesthesia Plan: General and Regional   Post-op Pain Management: GA combined w/ Regional for post-op pain   Induction: Intravenous  PONV Risk Score and Plan: 3 and Midazolam, Dexamethasone, Ondansetron and Treatment may vary due to age or medical condition  Airway Management Planned: LMA  Additional Equipment:   Intra-op Plan:   Post-operative Plan: Extubation in OR  Informed Consent: I have reviewed the patients History and Physical, chart, labs and discussed the procedure including the risks, benefits and alternatives for the proposed anesthesia with the patient or authorized representative who has indicated his/her understanding and acceptance.     Dental advisory given  Plan Discussed with: CRNA  Anesthesia Plan Comments:        Anesthesia Quick Evaluation

## 2018-08-20 NOTE — Transfer of Care (Signed)
Immediate Anesthesia Transfer of Care Note  Patient: Megan Acevedo  Procedure(s) Performed: RIGHT BREAST LUMPECTOMY WITH RADIOACTIVE SEED AND RIGHT AXILLARY SENTINEL LYMPH NODE BIOPSY (Right Breast) INSERTION PORT-A-CATH WITH ULTRASOUND (Right Chest)  Patient Location: PACU  Anesthesia Type:General  Level of Consciousness: awake, alert  and oriented  Airway & Oxygen Therapy: Patient Spontanous Breathing and Patient connected to nasal cannula oxygen  Post-op Assessment: Report given to RN and Post -op Vital signs reviewed and stable  Post vital signs: Reviewed and stable  Last Vitals:  Vitals Value Taken Time  BP 149/97 08/20/2018  1:30 PM  Temp    Pulse 90 08/20/2018  1:42 PM  Resp 19 08/20/2018  1:42 PM  SpO2 100 % 08/20/2018  1:42 PM  Vitals shown include unvalidated device data.  Last Pain:  Vitals:   08/20/18 1327  TempSrc:   PainSc: 0-No pain      Patients Stated Pain Goal: 0 (57/32/20 2542)  Complications: No apparent anesthesia complications

## 2018-08-20 NOTE — Discharge Instructions (Signed)
[place] Office Phone Number [phone]  POST OP INSTRUCTIONS Take 400 mg of ibuprofen every 8 hours or 650 mg tylenol every 6 hours for next 72 hours then as needed. Use ice several times daily also. Always review your discharge instruction sheet given to you by the facility where your surgery was performed.  IF YOU HAVE DISABILITY OR FAMILY LEAVE FORMS, YOU MUST BRING THEM TO THE OFFICE FOR PROCESSING.  DO NOT GIVE THEM TO YOUR DOCTOR.  1. A prescription for pain medication may be given to you upon discharge.  Take your pain medication as prescribed, if needed.  If narcotic pain medicine is not needed, then you may take acetaminophen (Tylenol), naprosyn (Alleve) or ibuprofen (Advil) as needed. NO TYLENOL OR IBUPROFEN UNTIL 3:30pm if NEEDED 2. Take your usually prescribed medications unless otherwise directed 3. If you need a refill on your pain medication, please contact your pharmacy.  They will contact our office to request authorization.  Prescriptions will not be filled after 5pm or on week-ends. 4. You should eat very light the first 24 hours after surgery, such as soup, crackers, pudding, etc.  Resume your normal diet the day after surgery. 5. Most patients will experience some swelling and bruising in the breast.  Ice packs and a good support bra will help.  Wear the breast binder provided or a sports bra for 72 hours day and night.  After that wear a sports bra during the day until you return to the office. Swelling and bruising can take several days to resolve.  6. It is common to experience some constipation if taking pain medication after surgery.  Increasing fluid intake and taking a stool softener will usually help or prevent this problem from occurring.  A mild laxative (Milk of Magnesia or Miralax) should be taken according to package directions if there are no bowel movements after 48 hours. 7. Unless discharge instructions indicate otherwise, you may remove your  bandages 48 hours after surgery and you may shower at that time.  You may have steri-strips (small skin tapes) in place directly over the incision.  These strips should be left on the skin for 7-10 days and will come off on their own.  If your surgeon used skin glue on the incision, you may shower in 24 hours.  The glue will flake off over the next 2-3 weeks.  Any sutures or staples will be removed at the office during your follow-up visit. 8. ACTIVITIES:  You may resume regular daily activities (gradually increasing) beginning the next day.  Wearing a good support bra or sports bra minimizes pain and swelling.  You may have sexual intercourse when it is comfortable. a. You may drive when you no longer are taking prescription pain medication, you can comfortably wear a seatbelt, and you can safely maneuver your car and apply brakes. b. RETURN TO WORK:  ______________________________________________________________________________________ 9. You should see your doctor in the office for a follow-up appointment approximately two weeks after your surgery.  Your doctors nurse will typically make your follow-up appointment when she calls you with your pathology report.  Expect your pathology report 3-4 business days after your surgery.  You may call to check if you do not hear from [place] after three days. 10. OTHER INSTRUCTIONS: _______________________________________________________________________________________________ _____________________________________________________________________________________________________________________________________ _____________________________________________________________________________________________________________________________________ _____________________________________________________________________________________________________________________________________  WHEN TO CALL DR WAKEFIELD: 1. Fever over 101.0 2. Nausea and/or vomiting. 3. Extreme swelling  or bruising. 4. Continued bleeding from incision. 5. Increased pain, redness, or drainage from the  incision.  The clinic staff is available to answer your questions during regular business hours.  Please dont hesitate to call and ask to speak to one of the nurses for clinical concerns.  If you have a medical emergency, go to the nearest emergency room or call 911.  A surgeon from [facility] is always on call at the hospital.  For further questions, please visit [email or web address]   Post Anesthesia Home Care Instructions  Activity: Get plenty of rest for the remainder of the day. A responsible individual must stay with you for 24 hours following the procedure.  For the next 24 hours, DO NOT: -Drive a car -Paediatric nurse -Drink alcoholic beverages -Take any medication unless instructed by your physician -Make any legal decisions or sign important papers.  Meals: Start with liquid foods such as gelatin or soup. Progress to regular foods as tolerated. Avoid greasy, spicy, heavy foods. If nausea and/or vomiting occur, drink only clear liquids until the nausea and/or vomiting subsides. Call your physician if vomiting continues.  Special Instructions/Symptoms: Your throat may feel dry or sore from the anesthesia or the breathing tube placed in your throat during surgery. If this causes discomfort, gargle with warm salt water. The discomfort should disappear within 24 hours.  If you had a scopolamine patch placed behind your ear for the management of post- operative nausea and/or vomiting:  1. The medication in the patch is effective for 72 hours, after which it should be removed.  Wrap patch in a tissue and discard in the trash. Wash hands thoroughly with soap and water. 2. You may remove the patch earlier than 72 hours if you experience unpleasant side effects which may include dry mouth, dizziness or visual disturbances. 3. Avoid touching the patch. Wash your  hands with soap and water after contact with the patch.

## 2018-08-20 NOTE — Anesthesia Procedure Notes (Signed)
Anesthesia Regional Block: Pectoralis block   Pre-Anesthetic Checklist: ,, timeout performed, Correct Patient, Correct Site, Correct Laterality, Correct Procedure, Correct Position, site marked, Risks and benefits discussed,  Surgical consent,  Pre-op evaluation,  At surgeon's request and post-op pain management  Laterality: Right  Prep: chloraprep       Needles:  Injection technique: Single-shot  Needle Type: Echogenic Stimulator Needle     Needle Length: 9cm  Needle Gauge: 21     Additional Needles:   Procedures:,,,, ultrasound used (permanent image in chart),,,,  Narrative:  Start time: 08/20/2018 10:50 AM End time: 08/20/2018 11:00 AM Injection made incrementally with aspirations every 5 mL.  Performed by: Personally  Anesthesiologist: Murvin Natal, MD  Additional Notes: Functioning IV was confirmed and monitors were applied.  A timeout was performed. Sterile prep, hand hygiene and sterile gloves were used. A 82mm 21ga Arrow echogenic stimulator needle was used. Negative aspiration and negative test dose prior to incremental administration of local anesthetic. The patient tolerated the procedure well.  Ultrasound guidance: relevent anatomy identified, needle position confirmed, local anesthetic spread visualized around nerve(s), vascular puncture avoided.  Image printed for medical record.

## 2018-08-20 NOTE — H&P (Signed)
48 yof referred by Dr Megan Acevedo for new right breast cancer. her genetics by her report are negative in [name]. not sure which test. she has fh of breast cancer in her mom. she has no prior breast history. she had no mass or dc. she had screening mm with that showed a 2 cm ruoq breast mass. on mri this is 16 mm with no other masses, no abnormal lymph nodes. core biopsy is grade 1-2 IDC that is triple positive with ki of 10-15%. she is here to discuss options. she has already seen Dr Megan Acevedo.  Past Surgical History Megan Acevedo, [place]; 08/01/2018 10:35 AM) Breast Biopsy  Right. Gallbladder Surgery - Laparoscopic  Hysterectomy (not due to cancer) - Partial   Diagnostic Studies History Megan Acevedo, [place]; 08/01/2018 10:35 AM) Colonoscopy  never Mammogram  within last year Pap Smear  1-5 years ago  Allergies Megan Acevedo, RMA; 08/01/2018 10:36 AM) No Known Drug Allergies [08/01/2018]: Allergies Reconciled   Medication History Megan Acevedo, [place]; 08/01/2018 10:36 AM) Oxybutynin Chloride ER (10MG  Tablet ER 24HR, Oral) Active. Medications Reconciled  Social History Megan Acevedo, [place]; 08/01/2018 10:35 AM) Caffeine use  Coffee. No alcohol use  No drug use  Tobacco use  Never smoker.  Family History Megan Acevedo, [place]; 08/01/2018 10:35 AM) Alcohol Abuse  Sister. Arthritis  Mother. Breast Cancer  Mother. Depression  Sister. Diabetes Mellitus  Father, Mother. Heart Disease  Mother. Heart disease in female family member before age 41  Hypertension  Father, Mother. Thyroid problems  Mother.  Pregnancy / Birth History Megan Acevedo, [place]; 08/01/2018 10:35 AM) Age at menarche  74 years. Contraceptive History  Oral contraceptives. Gravida  2 Irregular periods  Length (months) of breastfeeding  3-6 Maternal age  65-20 Para  2  Other Problems Megan Acevedo, [place]; 08/01/2018 10:35 AM) Cholelithiasis  Gastroesophageal Reflux Disease    Review of  Systems Megan Acevedo RMA; 08/01/2018 10:35 AM) General Not Present- Appetite Loss, Chills, Fatigue, Fever, Night Sweats, Weight Gain and Weight Loss. Skin Not Present- Change in Wart/Mole, Dryness, Hives, Jaundice, New Lesions, Non-Healing Wounds, Rash and Ulcer. HEENT Present- Seasonal Allergies and Wears glasses/contact lenses. Not Present- Earache, Hearing Loss, Hoarseness, Nose Bleed, Oral Ulcers, Ringing in the Ears, Sinus Pain, Sore Throat, Visual Disturbances and Yellow Eyes. Respiratory Not Present- Bloody sputum, Chronic Cough, Difficulty Breathing, Snoring and Wheezing. Breast Present- Breast Mass. Not Present- Breast Pain, Nipple Discharge and Skin Changes. Cardiovascular Not Present- Chest Pain, Difficulty Breathing Lying Down, Leg Cramps, Palpitations, Rapid Heart Rate, Shortness of Breath and Swelling of Extremities. Gastrointestinal Not Present- Abdominal Pain, Bloating, Bloody Stool, Change in Bowel Habits, Chronic diarrhea, Constipation, Difficulty Swallowing, Excessive gas, Gets full quickly at meals, Hemorrhoids, Indigestion, Nausea, Rectal Pain and Vomiting. Female Genitourinary Present- Frequency and Nocturia. Not Present- Painful Urination, Pelvic Pain and Urgency. Musculoskeletal Not Present- Back Pain, Joint Pain, Joint Stiffness, Muscle Pain, Muscle Weakness and Swelling of Extremities. Neurological Not Present- Decreased Memory, Fainting, Headaches, Numbness, Seizures, Tingling, Tremor, Trouble walking and Weakness. Psychiatric Not Present- Anxiety, Bipolar, Change in Sleep Pattern, Depression, Fearful and Frequent crying. Endocrine Not Present- Cold Intolerance, Excessive Hunger, Hair Changes, Heat Intolerance, Hot flashes and New Diabetes. Hematology Not Present- Blood Thinners, Easy Bruising, Excessive bleeding, Gland problems, HIV and Persistent Infections.  Vitals Megan Acevedo RMA; 08/01/2018 10:36 AM) 08/01/2018 10:36 AM Weight: 136.4 lb Height: 65.5in Body  Surface Area: 1.69 m Body Mass Index: 22.35 kg/m  Temp.: 99.68F  Pulse: 96 (Regular)  BP: 138/78 (Sitting, Left Arm,  Standard) Physical Exam Megan Bookbinder MD; 08/01/2018 12:21 PM) General Mental Status-Alert. Head and Neck Trachea-midline. Thyroid Gland Characteristics - normal size and consistency. Eye Sclera/Conjunctiva - Bilateral-No scleral icterus. Breast Nipples-No Discharge. Breast Lump-No Palpable Breast Mass. Neurologic Neurologic evaluation reveals -alert and oriented x 3 with no impairment of recent or remote memory. Lymphatic General Lymphatics -Note: no [place] adenopathy. Head & Neck General Head & Neck Lymphatics: Bilateral - Description - Normal. Axillary General Axillary Region: Bilateral - Description - Normal. Note: no  adenopathy   Assessment & Plan Megan Bookbinder MD; 08/01/2018 12:23 PM) BREAST CANCER OF UPPER-OUTER QUADRANT OF RIGHT FEMALE BREAST (C50.411) Story: Right breast seed guided lumpectomy, right axillary sentinel node biopsy, port placement with [place] needs to proceed with surgery soon and not delay We discussed the staging and pathophysiology of breast cancer. We discussed all of the different options for treatment for breast cancer including surgery, chemotherapy, radiation therapy, Herceptin, and antiestrogen therapy. We discussed a sentinel lymph node biopsy as she does not appear to having lymph node involvement right now. We discussed the performance of that with injection of radioactive tracer. We discussed that there is a chance of having a positive node with a sentinel lymph node biopsy and we will await the permanent pathology to make any other first further decisions in terms of her treatment. We discussed up to a 5% risk lifetime of chronic shoulder pain as well as lymphedema associated with a sentinel lymph node biopsy. We discussed the options for treatment of the breast cancer which included lumpectomy versus a  mastectomy. We discussed the performance of the lumpectomy with radioactive seed placement. We discussed a 5% chance of a positive margin requiring reexcision in the operating room. We also discussed that she will need radiation therapy if she undergoes lumpectomy. We discussed mastectomy. Mastectomy can be followed by reconstruction. The decision for lumpectomy vs mastectomy has no impact on decision for chemotherapy. Most mastectomy patients will not need radiation therapy. We discussed that there is no difference in her survival whether she undergoes lumpectomy with radiation therapy or antiestrogen therapy versus a mastectomy. There is also no real difference between her recurrence in the breast. We discussed the risks of operation including bleeding, infection, possible reoperation. She understands her further therapy will be based on what her stages at the time of her operation.

## 2018-08-20 NOTE — Progress Notes (Signed)
Assisted Dr. Ellender with right, ultrasound guided, pectoralis block. Side rails up, monitors on throughout procedure. See vital signs in flow sheet. Tolerated Procedure well. °

## 2018-08-21 ENCOUNTER — Encounter (HOSPITAL_BASED_OUTPATIENT_CLINIC_OR_DEPARTMENT_OTHER): Payer: Self-pay | Admitting: General Surgery

## 2018-08-21 NOTE — Addendum Note (Signed)
Addendum  created 08/21/18 1300 by Tawni Millers, CRNA   Charge Capture section accepted

## 2018-08-25 NOTE — Assessment & Plan Note (Signed)
08/20/2018:Right lumpectomy: Grade 2 IDC with DCIS, 2.2 cm, margins negative, 1/1 lymph node positive with focal extranodal extension, ER 50%, PR 50%, HER-2 positive, 3+, Ki-67 10 to 15%, T2N1  Pathology counseling: I discussed the final pathology report of the patient provided  a copy of this report. I discussed the margins as well as lymph node surgeries. We also discussed the final staging along with previously performed ER/PR and HER-2/neu testing.  Recommendation: 1.  Axillary lymph node dissection 2.  Adjuvant chemotherapy with TCH Perjeta followed by Herceptin Perjeta maintenance 3.  Adjuvant radiation 4.  Of adjuvant antiestrogen therapy with tamoxifen x10 years  Chemotherapy Counseling: I discussed the risks and benefits of chemotherapy including the risks of nausea/ vomiting, risk of infection from low WBC count, fatigue due to chemo or anemia, bruising or bleeding due to low platelets, mouth sores, loss/ change in taste and decreased appetite. Liver and kidney function will be monitored through out chemotherapy as abnormalities in liver and kidney function may be a side effect of treatment. Cardiac dysfunction due to Herceptin and Perjeta were discussed in detail. Risk of permanent bone marrow dysfunction and leukemia due to chemo were also discussed.  Return to clinic to begin chemotherapy once she heals from the lymph node surgery.  

## 2018-08-27 NOTE — Progress Notes (Signed)
Patient Care Team: [name], MD as PCP - General (Internal Medicine) Mauro Kaufmann, RN as Oncology Nurse Navigator [name], RN as Oncology Nurse Navigator  DIAGNOSIS:    ICD-10-CM   1. Malignant neoplasm of upper-outer quadrant of right breast in female, estrogen receptor positive ([place]) [place]   Z17.0 CBC with Differential ([place] Only)    CMP ([place] only)    dexamethasone (DECADRON) 4 MG tablet    prochlorperazine (COMPAZINE) 10 MG tablet    LORazepam (ATIVAN) 0.5 MG tablet    ondansetron (ZOFRAN) 8 MG tablet    lidocaine-prilocaine (EMLA) cream    SUMMARY OF ONCOLOGIC HISTORY:   Malignant neoplasm of upper-outer quadrant of right breast in female, estrogen receptor positive ([place])   06/26/2018 Initial Diagnosis    [place]: Screening mammogram detected focal asymmetry in the right breast, ultrasound revealed 2 cm oval mass: Biopsy revealed grade 1-2 invasive ductal carcinoma, ER 3+, PR 3+, HER-2 3+ positive T1c M0 stage Ia clinical stage    07/23/2018 Cancer Staging    Staging form: Breast, AJCC 8th Edition - Clinical stage from 07/23/2018: Stage IA (cT1c, cN0, cM0, G2, ER+, PR+, HER2+) - Signed by Nicholas Lose, MD on 07/23/2018    08/20/2018 Surgery    Right lumpectomy: Grade 2 IDC with DCIS, 2.2 cm, margins negative, 1/1 lymph node positive with focal extranodal extension, ER 50%, PR 50%, HER-2 positive, 3+, Ki-67 10 to 15%, T2N1    09/19/2018 -  Chemotherapy    The patient had palonosetron (ALOXI) injection 0.25 mg, 0.25 mg, Intravenous,  Once, 0 of 6 cycles pegfilgrastim-cbqv (UDENYCA) injection 6 mg, 6 mg, Subcutaneous, Once, 0 of 6 cycles trastuzumab (HERCEPTIN) 504 mg in sodium chloride 0.9 % 250 mL chemo infusion, 8 mg/kg = 504 mg, Intravenous,  Once, 0 of 17 cycles CARBOplatin (PARAPLATIN) 700 mg in sodium chloride 0.9 % 250 mL chemo infusion, 700 mg (100 % of original dose 700 mg), Intravenous,   Once, 0 of 6 cycles Dose modification: 700 mg (original dose 700 mg, Cycle 1) DOCEtaxel (TAXOTERE) 130 mg in sodium chloride 0.9 % 250 mL chemo infusion, 75 mg/m2 = 130 mg, Intravenous,  Once, 0 of 6 cycles pertuzumab (PERJETA) 840 mg in sodium chloride 0.9 % 250 mL chemo infusion, 840 mg, Intravenous, Once, 0 of 17 cycles fosaprepitant (EMEND) 150 mg, dexamethasone (DECADRON) 12 mg in sodium chloride 0.9 % 145 mL IVPB, , Intravenous,  Once, 0 of 6 cycles  for chemotherapy treatment.      CHIEF COMPLIANT: Follow-up s/p lumpectomy to review pathology  INTERVAL HISTORY: Megan Acevedo is a 48 y.o. with above-mentioned history of right breast cancer who underwent a lumpectomy on 08/20/18 with Dr. Donne Hazel. Pathology confirmed invasive ductal carcinoma with DCIS, ER 50%, PR 50%, HER2 positive, Ki67 10-15%, with clear margins and one positive right axillary lymph node. She presents to the clinic today to review the pathology report and discuss further treatment.  She is healing and recovering very well from recent surgery.  REVIEW OF SYSTEMS:   Constitutional: Denies fevers, chills or abnormal weight loss Eyes: Denies blurriness of vision Ears, nose, mouth, throat, and face: Denies mucositis or sore throat Respiratory: Denies cough, dyspnea or wheezes Cardiovascular: Denies palpitation, chest discomfort Gastrointestinal: Denies nausea, heartburn or change in bowel habits Skin: Denies abnormal skin rashes Lymphatics: Denies new lymphadenopathy or easy bruising Neurological: Denies numbness, tingling or new weaknesses Behavioral/Psych: Mood is stable, no new changes  Extremities: No lower extremity edema Breast: Recent lumpectomy and sentinel lymph node biopsy. All other systems were reviewed with the patient and are negative.  I have reviewed the past medical history, past surgical history, social history and family history with the patient and they are unchanged from previous note.   ALLERGIES:  has No Known Allergies.  MEDICATIONS:  Current Outpatient Medications  Medication Sig Dispense Refill  . acetaminophen (TYLENOL) 325 MG tablet Take 650 mg by mouth every 6 (six) hours as needed.    Marland Kitchen dexamethasone (DECADRON) 4 MG tablet Take 1 tablet (4 mg total) by mouth daily. Take 1 tablet day before chemo and 1 tablet day after chemo with food 12 tablet 0  . lidocaine-prilocaine (EMLA) cream Apply to affected area once 30 g 3  . LORazepam (ATIVAN) 0.5 MG tablet Take 1 tablet (0.5 mg total) by mouth at bedtime as needed (Nausea or vomiting). 30 tablet 0  . Multiple Vitamins-Minerals (CENTRUM SILVER 50+WOMEN) TABS Take 1 each by mouth every morning.    Marland Kitchen omeprazole (PRILOSEC) 20 MG capsule Take 20 mg by mouth daily.    . ondansetron (ZOFRAN) 8 MG tablet Take 1 tablet (8 mg total) by mouth 2 (two) times daily as needed (Nausea or vomiting). Begin 4 days after chemotherapy. 30 tablet 1  . oxybutynin (DITROPAN) 5 MG tablet Take 5 mg by mouth 3 (three) times daily.    Marland Kitchen oxyCODONE (OXY IR/ROXICODONE) 5 MG immediate release tablet Take 1 tablet (5 mg total) by mouth every 6 (six) hours as needed for moderate pain, severe pain or breakthrough pain. 12 tablet 0  . prochlorperazine (COMPAZINE) 10 MG tablet Take 1 tablet (10 mg total) by mouth every 6 (six) hours as needed (Nausea or vomiting). 30 tablet 1   No current facility-administered medications for this visit.     PHYSICAL EXAMINATION: ECOG PERFORMANCE STATUS: 1 - Symptomatic but completely ambulatory  Vitals:   08/28/18 1525  BP: (!) 155/76  Pulse: 78  Resp: 18  Temp: 98.7 F (37.1 C)  SpO2: 100%   Filed Weights   08/28/18 1525  Weight: 141 lb 9.6 oz (64.2 kg)    GENERAL: alert, no distress and comfortable SKIN: skin color, texture, turgor are normal, no rashes or significant lesions EYES: normal, Conjunctiva are pink and non-injected, sclera clear OROPHARYNX: no exudate, no erythema and lips, buccal mucosa, and  tongue normal  NECK: supple, thyroid normal size, non-tender, without nodularity LYMPH: no palpable lymphadenopathy in the cervical, axillary or inguinal LUNGS: clear to auscultation and percussion with normal breathing effort HEART: regular rate & rhythm and no murmurs and no lower extremity edema ABDOMEN: abdomen soft, non-tender and normal bowel sounds MUSCULOSKELETAL: no cyanosis of digits and no clubbing  NEURO: alert & oriented x 3 with fluent speech, no focal motor/sensory deficits EXTREMITIES: No lower extremity edema  ASSESSMENT & PLAN:  Malignant neoplasm of upper-outer quadrant of right breast in female, estrogen receptor positive ([facility]) 08/20/2018:Right lumpectomy: Grade 2 IDC with DCIS, 2.2 cm, margins negative, 1/1 lymph node positive with focal extranodal extension, ER 50%, PR 50%, HER-2 positive, 3+, Ki-67 10 to 15%, T2N1  Pathology counseling: I discussed the final pathology report of the patient provided  a copy of this report. I discussed the margins as well as lymph node surgeries. We also discussed the final staging along with previously performed ER/PR and HER-2/neu testing.  Recommendation: 1.  Adjuvant chemotherapy with [place] followed by Herceptin Perjeta maintenance 2.  Adjuvant radiation  3.  Adjuvant antiestrogen therapy with tamoxifen x10 years 4.  Adjuvant neratinib  Chemotherapy Counseling: I discussed the risks and benefits of chemotherapy including the risks of nausea/ vomiting, risk of infection from low WBC count, fatigue due to chemo or anemia, bruising or bleeding due to low platelets, mouth sores, loss/ change in taste and decreased appetite. Liver and kidney function will be monitored through out chemotherapy as abnormalities in liver and kidney function may be a side effect of treatment. Cardiac dysfunction due to Herceptin and Perjeta were discussed in detail. Risk of permanent bone marrow dysfunction and leukemia due to chemo were also discussed.   Patient is planning to use Digna cap or cold cap for her chemo.  Return to clinic to begin chemotherapy on 09/19/2018   Orders Placed This Encounter  Procedures  . CBC with Differential (Cancer Center Only)    Standing Status:   Standing    Number of Occurrences:   20    Standing Expiration Date:   08/28/2019  . CMP ([facility] only)    Standing Status:   Standing    Number of Occurrences:   20    Standing Expiration Date:   08/28/2019  . PHYSICIAN COMMUNICATION ORDER    A baseline Echo/[place] should be obtained prior to initiation of Herceptin, at 3, 6, 9 months during Herceptin  Treatment.   The patient has a good understanding of the overall plan. she agrees with it. she will call with any problems that may develop before the next visit here.  Nicholas Lose, MD 08/28/2018  Julious Oka Dorshimer am acting as scribe for Dr. Nicholas Lose.  I have reviewed the above documentation for accuracy and completeness, and I agree with the above.

## 2018-08-28 ENCOUNTER — Other Ambulatory Visit: Payer: Self-pay

## 2018-08-28 ENCOUNTER — Inpatient Hospital Stay: Payer: BLUE CROSS/BLUE SHIELD | Attending: Hematology and Oncology | Admitting: Hematology and Oncology

## 2018-08-28 VITALS — BP 155/76 | HR 78 | Temp 98.7°F | Resp 18 | Ht 65.0 in | Wt 141.6 lb

## 2018-08-28 DIAGNOSIS — Z170 Estrogen receptor positive status [ER+]: Secondary | ICD-10-CM

## 2018-08-28 DIAGNOSIS — C50411 Malignant neoplasm of upper-outer quadrant of right female breast: Secondary | ICD-10-CM | POA: Insufficient documentation

## 2018-08-28 DIAGNOSIS — Z79899 Other long term (current) drug therapy: Secondary | ICD-10-CM | POA: Insufficient documentation

## 2018-08-28 MED ORDER — LIDOCAINE-PRILOCAINE 2.5-2.5 % EX CREA: TOPICAL_CREAM | CUTANEOUS | 3 refills | Status: DC

## 2018-08-28 MED ORDER — LORAZEPAM 0.5 MG PO TABS: 0.5000 mg | ORAL_TABLET | Freq: Every evening | ORAL | 0 refills | Status: DC | PRN

## 2018-08-28 MED ORDER — DEXAMETHASONE 4 MG PO TABS: 4.0000 mg | ORAL_TABLET | Freq: Every day | ORAL | 0 refills | Status: DC

## 2018-08-28 MED ORDER — PROCHLORPERAZINE MALEATE 10 MG PO TABS: 10.0000 mg | ORAL_TABLET | Freq: Four times a day (QID) | ORAL | 1 refills | Status: DC | PRN

## 2018-08-28 MED ORDER — ONDANSETRON HCL 8 MG PO TABS: 8.0000 mg | ORAL_TABLET | Freq: Two times a day (BID) | ORAL | 1 refills | Status: DC | PRN

## 2018-08-28 NOTE — Progress Notes (Signed)
START ON PATHWAY REGIMEN - Breast     A cycle is every 21 days:     Docetaxel      Carboplatin      Trastuzumab-xxxx      Trastuzumab-xxxx      Pertuzumab      Pertuzumab   **Always confirm dose/schedule in your pharmacy ordering system**  Patient Characteristics: Postoperative without Neoadjuvant Therapy (Pathologic Staging), Invasive Disease, Adjuvant Therapy, HER2 Positive, ER Positive, Node Positive, pT2, pN1a or Higher Therapeutic Status: Postoperative without Neoadjuvant Therapy (Pathologic Staging) AJCC Grade: G2 AJCC N Category: pN1a AJCC M Category: cM0 ER Status: Positive (+) AJCC 8 Stage Grouping: IB HER2 Status: Positive (+) Oncotype Dx Recurrence Score: Not Appropriate AJCC T Category: pT2 PR Status: Positive (+) Intent of Therapy: Curative Intent, Discussed with Patient

## 2018-08-29 ENCOUNTER — Telehealth: Payer: Self-pay | Admitting: *Deleted

## 2018-08-29 ENCOUNTER — Other Ambulatory Visit: Payer: Self-pay | Admitting: *Deleted

## 2018-08-29 ENCOUNTER — Telehealth: Payer: Self-pay | Admitting: Hematology and Oncology

## 2018-08-29 DIAGNOSIS — C50411 Malignant neoplasm of upper-outer quadrant of right female breast: Secondary | ICD-10-CM

## 2018-08-29 DIAGNOSIS — Z170 Estrogen receptor positive status [ER+]: Secondary | ICD-10-CM

## 2018-08-29 NOTE — Telephone Encounter (Signed)
Called regarding schedule patient is out of town I did not decouple because of that

## 2018-08-29 NOTE — Telephone Encounter (Signed)
Spoke with patient regarding appointments.  She states she had her echo done at [facility] in [place] 4/29.  I have requested a copy of that report. She is scheduled for patient education 6/3.

## 2018-08-29 NOTE — Telephone Encounter (Signed)
Left message for a return phone call to schedule her for echo and chemo education.

## 2018-09-09 ENCOUNTER — Telehealth: Payer: Self-pay | Admitting: Hematology and Oncology

## 2018-09-10 ENCOUNTER — Inpatient Hospital Stay: Payer: BLUE CROSS/BLUE SHIELD | Attending: Hematology and Oncology

## 2018-09-10 ENCOUNTER — Telehealth: Payer: Self-pay | Admitting: *Deleted

## 2018-09-10 DIAGNOSIS — Z79899 Other long term (current) drug therapy: Secondary | ICD-10-CM | POA: Insufficient documentation

## 2018-09-10 DIAGNOSIS — Z5112 Encounter for antineoplastic immunotherapy: Secondary | ICD-10-CM | POA: Insufficient documentation

## 2018-09-10 DIAGNOSIS — Z170 Estrogen receptor positive status [ER+]: Secondary | ICD-10-CM | POA: Insufficient documentation

## 2018-09-10 DIAGNOSIS — C50411 Malignant neoplasm of upper-outer quadrant of right female breast: Secondary | ICD-10-CM | POA: Insufficient documentation

## 2018-09-10 DIAGNOSIS — Z5111 Encounter for antineoplastic chemotherapy: Secondary | ICD-10-CM | POA: Insufficient documentation

## 2018-09-10 DIAGNOSIS — Z5189 Encounter for other specified aftercare: Secondary | ICD-10-CM | POA: Insufficient documentation

## 2018-09-10 DIAGNOSIS — Z79810 Long term (current) use of selective estrogen receptor modulators (SERMs): Secondary | ICD-10-CM | POA: Insufficient documentation

## 2018-09-10 NOTE — Assessment & Plan Note (Signed)
08/20/2018:Right lumpectomy: Grade 2 IDC with DCIS, 2.2 cm, margins negative, 1/1 lymph node positive with focal extranodal extension, ER 50%, PR 50%, HER-2 positive, 3+, Ki-67 10 to 15%, T2N1  Pathology counseling: I discussed the final pathology report of the patient provided  a copy of this report. I discussed the margins as well as lymph node surgeries. We also discussed the final staging along with previously performed ER/PR and HER-2/neu testing.  Recommendation: 1.  Adjuvant chemotherapy with TCH Perjeta followed by Herceptin Perjeta maintenance 2.  Adjuvant radiation 3.  Adjuvant antiestrogen therapy with tamoxifen x10 years 4.  Adjuvant neratinib ---------------------------------------------------------------------------------------------------------------------------------------- Current treatment: Cycle 1 day 1 TCH Perjeta Echocardiogram 07/17/2018: EF 60 to 65% Labs reviewed Consent obtained Chemo education completed Antiemetics reviewed  Return to clinic 1 week for toxicity check 

## 2018-09-11 ENCOUNTER — Ambulatory Visit: Payer: BLUE CROSS/BLUE SHIELD | Attending: General Surgery | Admitting: Physical Therapy

## 2018-09-11 ENCOUNTER — Other Ambulatory Visit: Payer: Self-pay

## 2018-09-11 ENCOUNTER — Encounter: Payer: Self-pay | Admitting: Physical Therapy

## 2018-09-11 DIAGNOSIS — R293 Abnormal posture: Secondary | ICD-10-CM | POA: Diagnosis present

## 2018-09-11 DIAGNOSIS — C50411 Malignant neoplasm of upper-outer quadrant of right female breast: Secondary | ICD-10-CM | POA: Insufficient documentation

## 2018-09-11 DIAGNOSIS — Z170 Estrogen receptor positive status [ER+]: Secondary | ICD-10-CM | POA: Diagnosis present

## 2018-09-11 NOTE — Patient Instructions (Signed)

## 2018-09-11 NOTE — Therapy (Signed)
[place], [place], [address] Phone: [phone]   Fax:  [phone]  Physical Therapy Evaluation  Patient Details  Name: Megan Acevedo MRN: [id number] Date of Birth: 09-13-1970 Referring Provider (PT): Donne Hazel   Encounter Date: 09/11/2018  PT End of Session - 09/11/18 1440    Visit Number  1    Number of Visits  1    PT Start Time  1406    Activity Tolerance  Patient tolerated treatment well    Behavior During Therapy  [facility] for tasks assessed/performed       Past Medical History:  Diagnosis Date  . Cancer [facility])     Past Surgical History:  Procedure Laterality Date  . ABDOMINAL HYSTERECTOMY    . BREAST LUMPECTOMY WITH RADIOACTIVE SEED AND SENTINEL LYMPH NODE BIOPSY Right 08/20/2018   Procedure: RIGHT BREAST LUMPECTOMY WITH RADIOACTIVE SEED AND RIGHT AXILLARY SENTINEL LYMPH NODE BIOPSY;  Surgeon: [name], MD;  Location: [facility];  Service: General;  Laterality: Right;  . CHOLECYSTECTOMY    . PORTACATH PLACEMENT Right 08/20/2018   Procedure: INSERTION PORT-A-CATH WITH ULTRASOUND;  Surgeon: [name], MD;  Location: [facility];  Service: General;  Laterality: Right;  . TUBAL LIGATION      There were no vitals filed for this visit.   Subjective Assessment - 09/11/18 1408    Subjective  I had a right lumpectomy and I am going to start chemotherapy soon. I will start radiation after chemo. I can move my arms just fine. I am not having any swelling.     Pertinent History  Right lumpectomy: Grade 2 IDC with DCIS, 2.2 cm, margins negative, 1/1 lymph node positive with focal extranodal extension, ER 50%, PR 50%, HER-2 positive, 3+, Ki-67 10 to 15%, T2N1, pt is going to start chemo and will begin radiation after completing that    Patient Stated Goals  continue to be able to move arms through full ROM    Currently in Pain?  No/denies         [facility]  PT Assessment - 09/11/18 0001      Assessment   Medical Diagnosis  right breast cancer    Referring Provider (PT)  Donne Hazel    Onset Date/Surgical Date  08/20/18    Hand Dominance  Right    Prior Therapy  none      Precautions   Precautions  Other (comment)    Precaution Comments  at risk for lymphedema      Restrictions   Weight Bearing Restrictions  No      Balance Screen   Has the patient fallen in the past 6 months  No    Has the patient had a decrease in activity level because of a fear of falling?   No    Is the patient reluctant to leave their home because of a fear of falling?   No      Home Film/video editor residence    Living Arrangements  Spouse/significant other    Available Help at Discharge  Family    Type of [place] to enter    Entrance Stairs-Number of Steps  2    [place]  Two level      Prior Function   Level of Independence  Independent    Vocation  Full time employment    Occupational psychologist  at [employer]  pt does not exercise       Cognition   Overall Cognitive Status  Within Functional Limits for tasks assessed      ROM / Strength   AROM / PROM / Strength  AROM      AROM   Overall AROM   Within functional limits for tasks performed    Overall AROM Comments  full ROM bilaterally, no tightness on R        LYMPHEDEMA/ONCOLOGY QUESTIONNAIRE - 09/11/18 1415      Type   Cancer Type  right breast cancer      Surgeries   Lumpectomy Date  08/20/18    Sentinel Lymph Node Biopsy Date  08/20/18    Number Lymph Nodes Removed  1   was positive     Treatment   Active Chemotherapy Treatment  No   about to begin   Past Chemotherapy Treatment  No    Active Radiation Treatment  No   will begin after chemo   Past Radiation Treatment  No    Current Hormone Treatment  No   will begin after radiation   Past Hormone Therapy  No      What other symptoms do you have    Are you Having Heaviness or Tightness  No    Are you having Pain  No    Are you having pitting edema  No    Is it Hard or Difficult finding clothes that fit  No    Do you have infections  No    Is there Decreased scar mobility  No      Lymphedema Assessments   Lymphedema Assessments  Upper extremities      Right Upper Extremity Lymphedema   15 cm Proximal to Olecranon Process  26.5 cm    Olecranon Process  23.5 cm    15 cm Proximal to Ulnar Styloid Process  22.5 cm    Just Proximal to Ulnar Styloid Process  15.2 cm    Across Hand at [employer]  18.5 cm    At [place] of 2nd Digit  5.9 cm      Left Upper Extremity Lymphedema   15 cm Proximal to Olecranon Process  26.7 cm    Olecranon Process  23.5 cm    15 cm Proximal to Ulnar Styloid Process  21.5 cm    Just Proximal to Ulnar Styloid Process  15 cm    Across Hand at [employer]  18.5 cm    At [place] of 2nd Digit  5.6 cm             Objective measurements completed on examination: See above findings.      [facility] Adult PT Treatment/Exercise - 09/11/18 0001      Posture/Postural Control   Posture/Postural Control  Postural limitations    Postural Limitations  Rounded Shoulders      Exercises   Exercises  Shoulder      Shoulder Exercises: Supine   Horizontal ABduction  Strengthening;Both;10 reps   pt returned therapist demo   Theraband Level (Shoulder Horizontal ABduction)  Level 2 (Red)    External Rotation  Strengthening;Both;10 reps   pt returned therapist demo   Theraband Level (Shoulder External Rotation)  Level 2 (Red)    Flexion  Strengthening;Both;10 reps   with 5 sec holds, pt returned therapist demo   Theraband Level (Shoulder Flexion)  Level 2 (Red)    Diagonals  Strengthening;Both;10 reps   pt returend therapist demo   Theraband Level (Shoulder Diagonals)  Level 2 (Red)             PT Education - 09/11/18 1439    Education Details  anatomy and physiology of lymphatic system, lymphedema  risk reduction practices, after breast surgery exercises, supine scap exercises    Person(s) Educated  Patient    Methods  Explanation;Demonstration;Handout    Comprehension  Verbalized understanding;Returned demonstration           Breast Clinic Goals - 09/11/18 1453      Patient will be able to verbalize understanding of pertinent lymphedema risk reduction practices relevant to her diagnosis specifically related to skin care.   Status  Achieved      Patient will be able to return demonstrate and/or verbalize understanding of the post-op home exercise program related to regaining shoulder range of motion.   Status  Achieved            Plan - 09/11/18 1440    Clinical Impression Statement  Pt presents to PT following a right lumpectomy and SLNB on 08/20/18. Currently pt has full range of motion and strength. She will begin chemotherapy this month and once she completes that she will have to complete radiation. Educated pt on strengthening and ROM exercises to do throughout radiation to keep from loosing ROM. Educated pt about lymphedema and risk reduction practices. Pt will be discharged from skilled PT services at this time because she does not have any other needs.     Stability/Clinical Decision Making  Stable/Uncomplicated    Clinical Decision Making  Low    Rehab Potential  Excellent    PT Frequency  One time visit    PT Treatment/Interventions  ADLs/Self Care Home Management;Patient/family education    PT Next Visit Plan  dc this vist    PT Home Exercise Plan  supine scap    Consulted and Agree with Plan of Care  Patient       Patient will benefit from skilled therapeutic intervention in order to improve the following deficits and impairments:  Decreased knowledge of precautions  Visit Diagnosis: Malignant neoplasm of upper-outer quadrant of right breast in female, estrogen receptor positive ([place])  Abnormal posture     Problem List Patient Active Problem List    Diagnosis Date Noted  . Malignant neoplasm of upper-outer quadrant of right breast in female, estrogen receptor positive ([place]) 07/23/2018    Allyson Sabal [facility] 09/11/2018, 2:54 PM  [place], [place], [address] Phone: [phone]   Fax:  [phone]  Name: Megan Acevedo MRN: [id number] Date of Birth: 1971/01/22  Manus Gunning, PT 09/11/18 2:54 PM

## 2018-09-18 NOTE — Progress Notes (Signed)
Patient Care Team: [name], MD as PCP - General (Internal Medicine) Mauro Kaufmann, RN as Oncology Nurse Navigator [name], RN as Oncology Nurse Navigator  DIAGNOSIS:    ICD-10-CM   1. Malignant neoplasm of upper-outer quadrant of right breast in female, estrogen receptor positive ([place])  C50.411    Z17.0     SUMMARY OF ONCOLOGIC HISTORY: Oncology History  Malignant neoplasm of upper-outer quadrant of right breast in female, estrogen receptor positive ([employer])  06/26/2018 Initial Diagnosis   [place]: Screening mammogram detected focal asymmetry in the right breast, ultrasound revealed 2 cm oval mass: Biopsy revealed grade 1-2 invasive ductal carcinoma, ER 3+, PR 3+, HER-2 3+ positive T1c M0 stage Ia clinical stage   07/23/2018 Cancer Staging   Staging form: Breast, AJCC 8th Edition - Clinical stage from 07/23/2018: Stage IA (cT1c, cN0, cM0, G2, ER+, PR+, HER2+) - Signed by Nicholas Lose, MD on 07/23/2018   08/20/2018 Surgery   Right lumpectomy: Grade 2 IDC with DCIS, 2.2 cm, margins negative, 1/1 lymph node positive with focal extranodal extension, ER 50%, PR 50%, HER-2 positive, 3+, Ki-67 10 to 15%, T2N1   09/03/2018 Cancer Staging   Staging form: Breast, AJCC 8th Edition - Pathologic: Stage IB (pT2, pN1a, cM0, G2, ER+, PR+, HER2+) - Signed by Gardenia Phlegm, NP on 09/03/2018   09/19/2018 -  Chemotherapy   The patient had palonosetron (ALOXI) injection 0.25 mg, 0.25 mg, Intravenous,  Once, 0 of 6 cycles pegfilgrastim-cbqv (UDENYCA) injection 6 mg, 6 mg, Subcutaneous, Once, 0 of 6 cycles trastuzumab (HERCEPTIN) 504 mg in sodium chloride 0.9 % 250 mL chemo infusion, 8 mg/kg = 504 mg, Intravenous,  Once, 0 of 17 cycles CARBOplatin (PARAPLATIN) 700 mg in sodium chloride 0.9 % 250 mL chemo infusion, 700 mg (100 % of original dose 700 mg), Intravenous,  Once, 0 of 6 cycles Dose modification: 700 mg (original dose 700 mg, Cycle 1) DOCEtaxel (TAXOTERE) 130  mg in sodium chloride 0.9 % 250 mL chemo infusion, 75 mg/m2 = 130 mg, Intravenous,  Once, 0 of 6 cycles pertuzumab (PERJETA) 840 mg in sodium chloride 0.9 % 250 mL chemo infusion, 840 mg, Intravenous, Once, 0 of 17 cycles fosaprepitant (EMEND) 150 mg, dexamethasone (DECADRON) 12 mg in sodium chloride 0.9 % 145 mL IVPB, , Intravenous,  Once, 0 of 6 cycles  for chemotherapy treatment.      CHIEF COMPLIANT: Cycle 1 TCHP  INTERVAL HISTORY: Megan Acevedo is a 48 y.o. with above-mentioned history of HER2 positive right breast cancer who underwent a lumpectomy and presents to the clinic today to begin adjuvant chemotherapy with TCHP.   REVIEW OF SYSTEMS:   Constitutional: Denies fevers, chills or abnormal weight loss Eyes: Denies blurriness of vision Ears, nose, mouth, throat, and face: Denies mucositis or sore throat Respiratory: Denies cough, dyspnea or wheezes Cardiovascular: Denies palpitation, chest discomfort Gastrointestinal: Denies nausea, heartburn or change in bowel habits Skin: Denies abnormal skin rashes Lymphatics: Denies new lymphadenopathy or easy bruising Neurological: Denies numbness, tingling or new weaknesses Behavioral/Psych: Mood is stable, no new changes  Extremities: No lower extremity edema Breast: denies any pain or lumps or nodules in either breasts All other systems were reviewed with the patient and are negative.  I have reviewed the past medical history, past surgical history, social history and family history with the patient and they are unchanged from previous note.  ALLERGIES:  has No Known Allergies.  MEDICATIONS:  Current Outpatient Medications  Medication  Sig Dispense Refill   acetaminophen (TYLENOL) 325 MG tablet Take 650 mg by mouth every 6 (six) hours as needed.     dexamethasone (DECADRON) 4 MG tablet Take 1 tablet (4 mg total) by mouth daily. Take 1 tablet day before chemo and 1 tablet day after chemo with food 12 tablet 0    lidocaine-prilocaine (EMLA) cream Apply to affected area once 30 g 3   LORazepam (ATIVAN) 0.5 MG tablet Take 1 tablet (0.5 mg total) by mouth at bedtime as needed (Nausea or vomiting). 30 tablet 0   Multiple Vitamins-Minerals (CENTRUM SILVER 50+WOMEN) TABS Take 1 each by mouth every morning.     omeprazole (PRILOSEC) 20 MG capsule Take 20 mg by mouth daily.     ondansetron (ZOFRAN) 8 MG tablet Take 1 tablet (8 mg total) by mouth 2 (two) times daily as needed (Nausea or vomiting). Begin 4 days after chemotherapy. 30 tablet 1   oxybutynin (DITROPAN) 5 MG tablet Take 5 mg by mouth 3 (three) times daily.     oxyCODONE (OXY IR/ROXICODONE) 5 MG immediate release tablet Take 1 tablet (5 mg total) by mouth every 6 (six) hours as needed for moderate pain, severe pain or breakthrough pain. 12 tablet 0   prochlorperazine (COMPAZINE) 10 MG tablet Take 1 tablet (10 mg total) by mouth every 6 (six) hours as needed (Nausea or vomiting). 30 tablet 1   No current facility-administered medications for this visit.     PHYSICAL EXAMINATION: ECOG PERFORMANCE STATUS: 1 - Symptomatic but completely ambulatory  There were no vitals filed for this visit. There were no vitals filed for this visit.  GENERAL: alert, no distress and comfortable SKIN: skin color, texture, turgor are normal, no rashes or significant lesions EYES: normal, Conjunctiva are pink and non-injected, sclera clear OROPHARYNX: no exudate, no erythema and lips, buccal mucosa, and tongue normal  NECK: supple, thyroid normal size, non-tender, without nodularity LYMPH: no palpable lymphadenopathy in the cervical, axillary or inguinal LUNGS: clear to auscultation and percussion with normal breathing effort HEART: regular rate & rhythm and no murmurs and no lower extremity edema ABDOMEN: abdomen soft, non-tender and normal bowel sounds MUSCULOSKELETAL: no cyanosis of digits and no clubbing  NEURO: alert & oriented x 3 with fluent speech, no  focal motor/sensory deficits EXTREMITIES: No lower extremity edema  LABORATORY DATA:  I have reviewed the data as listed No flowsheet data found.  No results found for: WBC, HGB, HCT, MCV, PLT, NEUTROABS  ASSESSMENT & PLAN:  Malignant neoplasm of upper-outer quadrant of right breast in female, estrogen receptor positive ([place]) 08/20/2018:Right lumpectomy: Grade 2 IDC with DCIS, 2.2 cm, margins negative, 1/1 lymph node positive with focal extranodal extension, ER 50%, PR 50%, HER-2 positive, 3+, Ki-67 10 to 15%, T2N1  Pathology counseling: I discussed the final pathology report of the patient provided  a copy of this report. I discussed the margins as well as lymph node surgeries. We also discussed the final staging along with previously performed ER/PR and HER-2/neu testing.  Recommendation: 1.  Adjuvant chemotherapy with [place] followed by Herceptin Perjeta maintenance 2.  Adjuvant radiation 3.  Adjuvant antiestrogen therapy with tamoxifen x10 years 4.  Adjuvant neratinib ---------------------------------------------------------------------------------------------------------------------------------------- Current treatment: Cycle 1 day 1 TCH Perjeta Echocardiogram 07/17/2018: EF 60 to 65% Labs reviewed Consent obtained Chemo education completed Antiemetics reviewed  Return to clinic 1 week for toxicity check    No orders of the defined types were placed in this encounter.  The patient has a  good understanding of the overall plan. she agrees with it. she will call with any problems that may develop before the next visit here.  Nicholas Lose, MD 09/19/2018  Julious Oka Dorshimer am acting as scribe for Dr. Nicholas Lose.  I have reviewed the above documentation for accuracy and completeness, and I agree with the above.

## 2018-09-19 ENCOUNTER — Inpatient Hospital Stay: Payer: BLUE CROSS/BLUE SHIELD

## 2018-09-19 ENCOUNTER — Inpatient Hospital Stay (HOSPITAL_BASED_OUTPATIENT_CLINIC_OR_DEPARTMENT_OTHER): Payer: BLUE CROSS/BLUE SHIELD | Admitting: Hematology and Oncology

## 2018-09-19 ENCOUNTER — Other Ambulatory Visit: Payer: Self-pay

## 2018-09-19 ENCOUNTER — Telehealth: Payer: Self-pay | Admitting: *Deleted

## 2018-09-19 DIAGNOSIS — Z170 Estrogen receptor positive status [ER+]: Secondary | ICD-10-CM

## 2018-09-19 DIAGNOSIS — C50411 Malignant neoplasm of upper-outer quadrant of right female breast: Secondary | ICD-10-CM

## 2018-09-19 DIAGNOSIS — Z79899 Other long term (current) drug therapy: Secondary | ICD-10-CM

## 2018-09-19 DIAGNOSIS — Z79810 Long term (current) use of selective estrogen receptor modulators (SERMs): Secondary | ICD-10-CM

## 2018-09-19 DIAGNOSIS — Z5112 Encounter for antineoplastic immunotherapy: Secondary | ICD-10-CM | POA: Diagnosis present

## 2018-09-19 DIAGNOSIS — Z5111 Encounter for antineoplastic chemotherapy: Secondary | ICD-10-CM | POA: Diagnosis present

## 2018-09-19 DIAGNOSIS — Z5189 Encounter for other specified aftercare: Secondary | ICD-10-CM | POA: Diagnosis not present

## 2018-09-19 LAB — CMP (CANCER CENTER ONLY)
ALT: 19 U/L (ref 0–44)
AST: 17 U/L (ref 15–41)
Albumin: 4.4 g/dL (ref 3.5–5.0)
Alkaline Phosphatase: 79 U/L (ref 38–126)
Anion gap: 10 (ref 5–15)
BUN: 11 mg/dL (ref 6–20)
CO2: 23 mmol/L (ref 22–32)
Calcium: 8.8 mg/dL — ABNORMAL LOW (ref 8.9–10.3)
Chloride: 106 mmol/L (ref 98–111)
Creatinine: 0.74 mg/dL (ref 0.44–1.00)
GFR, Est AFR Am: >60 mL/min (ref >60)
GFR, Estimated: >60 mL/min (ref >60)
Glucose, Bld: 110 mg/dL — ABNORMAL HIGH (ref 70–99)
Potassium: 3.6 mmol/L (ref 3.5–5.1)
Sodium: 139 mmol/L (ref 135–145)
Total Bilirubin: 0.7 mg/dL (ref 0.3–1.2)
Total Protein: 7.6 g/dL (ref 6.5–8.1)

## 2018-09-19 LAB — CBC WITH DIFFERENTIAL (CANCER CENTER ONLY)
Abs Immature Granulocytes: 0.06 10*3/uL (ref 0.00–0.07)
Basophils Absolute: 0.1 10*3/uL (ref 0.0–0.1)
Basophils Relative: 0 %
Eosinophils Absolute: 0.1 10*3/uL (ref 0.0–0.5)
Eosinophils Relative: 0 %
HCT: 41.2 % (ref 36.0–46.0)
Hemoglobin: 13.6 g/dL (ref 12.0–15.0)
Immature Granulocytes: 0 %
Lymphocytes Relative: 19 %
Lymphs Abs: 2.9 10*3/uL (ref 0.7–4.0)
MCH: 30.2 pg (ref 26.0–34.0)
MCHC: 33 g/dL (ref 30.0–36.0)
MCV: 91.6 fL (ref 80.0–100.0)
Monocytes Absolute: 0.9 10*3/uL (ref 0.1–1.0)
Monocytes Relative: 6 %
Neutro Abs: 11.1 10*3/uL — ABNORMAL HIGH (ref 1.7–7.7)
Neutrophils Relative %: 75 %
Platelet Count: 329 10*3/uL (ref 150–400)
RBC: 4.5 MIL/uL (ref 3.87–5.11)
RDW: 12.6 % (ref 11.5–15.5)
WBC Count: 15.1 10*3/uL — ABNORMAL HIGH (ref 4.0–10.5)
nRBC: 0 % (ref 0.0–0.2)

## 2018-09-19 MED ORDER — SODIUM CHLORIDE 0.9 % IV SOLN: Freq: Once | INTRAVENOUS | Status: DC

## 2018-09-19 MED ORDER — SODIUM CHLORIDE 0.9% FLUSH
10.0000 mL | INTRAVENOUS | Status: DC | PRN
  Administered 2018-09-19: 10 mL
  Filled 2018-09-19: qty 10

## 2018-09-19 MED ORDER — DIPHENHYDRAMINE HCL 25 MG PO CAPS: 50.0000 mg | ORAL_CAPSULE | Freq: Once | ORAL | Status: DC

## 2018-09-19 MED ORDER — SODIUM CHLORIDE 0.9 % IV SOLN: 420.0000 mg | Freq: Once | INTRAVENOUS | Status: DC

## 2018-09-19 MED ORDER — PALONOSETRON HCL INJECTION 0.25 MG/5ML
INTRAVENOUS | Status: AC
  Filled 2018-09-19: qty 5

## 2018-09-19 MED ORDER — TRASTUZUMAB CHEMO 150 MG IV SOLR: 8.0000 mg/kg | Freq: Once | INTRAVENOUS | Status: DC

## 2018-09-19 MED ORDER — PALONOSETRON HCL INJECTION 0.25 MG/5ML: 0.2500 mg | Freq: Once | INTRAVENOUS | Status: DC

## 2018-09-19 MED ORDER — DIPHENHYDRAMINE HCL 25 MG PO CAPS
ORAL_CAPSULE | ORAL | Status: AC
  Filled 2018-09-19: qty 2

## 2018-09-19 MED ORDER — SODIUM CHLORIDE 0.9 % IV SOLN: 700.0000 mg | Freq: Once | INTRAVENOUS | Status: DC

## 2018-09-19 MED ORDER — ACETAMINOPHEN 325 MG PO TABS: 650.0000 mg | ORAL_TABLET | Freq: Once | ORAL | Status: DC

## 2018-09-19 MED ORDER — SODIUM CHLORIDE 0.9 % IV SOLN: 75.0000 mg/m2 | Freq: Once | INTRAVENOUS | Status: DC

## 2018-09-19 MED ORDER — SODIUM CHLORIDE 0.9% FLUSH
10.0000 mL | Freq: Once | INTRAVENOUS | Status: AC
  Administered 2018-09-19: 10 mL via INTRAVENOUS
  Filled 2018-09-19: qty 10

## 2018-09-19 MED ORDER — ACETAMINOPHEN 325 MG PO TABS
ORAL_TABLET | ORAL | Status: AC
  Filled 2018-09-19: qty 2

## 2018-09-19 MED ORDER — HEPARIN SOD (PORK) LOCK FLUSH 100 UNIT/ML IV SOLN
500.0000 [IU] | Freq: Once | INTRAVENOUS | Status: AC | PRN
  Administered 2018-09-19: 500 [IU]
  Filled 2018-09-19: qty 5

## 2018-09-19 MED ORDER — SODIUM CHLORIDE 0.9 % IV SOLN
Freq: Once | INTRAVENOUS | Status: AC
  Administered 2018-09-19: 09:00:00 via INTRAVENOUS
  Filled 2018-09-19: qty 250

## 2018-09-19 NOTE — Telephone Encounter (Signed)
Received call from pt stating she wanted to take the weekend to decide if she would want to get established with a new oncologist in [place] (in network) or continue receiving care from Dr. Lindi Adie (out of network).  Pt states she has met her deductible for in network services but she really would like to continue to receive care under Dr. Lindi Adie and is unsure what to do.  Pt stated she will take the weekend to call insurance and talk with her husband to make a decision on Monday.  Expressed my sincere apologies and pt appreciative of the help.

## 2018-09-19 NOTE — Patient Instructions (Signed)

## 2018-09-19 NOTE — Progress Notes (Signed)
Stopped by to see pt in infusion room & give her Pt Ed materials.  She states no questions.  Informed to call us with any concerns/questions.

## 2018-09-19 NOTE — Progress Notes (Signed)
No treatment given today. Treatment rescheduled.

## 2018-09-20 ENCOUNTER — Inpatient Hospital Stay: Payer: BLUE CROSS/BLUE SHIELD

## 2018-09-22 ENCOUNTER — Telehealth: Payer: Self-pay | Admitting: *Deleted

## 2018-09-22 NOTE — Telephone Encounter (Signed)
Spoke with patient and she said she received approval for the chemo on Friday after she returned home.  She states instead of paying 80/20, it will be 70/30 and she is fine with that.  She will return on 6/17 for her 1st TCH/P.  Encouraged her to call with any needs or concerns.

## 2018-09-23 ENCOUNTER — Encounter: Payer: Self-pay | Admitting: Hematology and Oncology

## 2018-09-23 NOTE — Progress Notes (Signed)
Called patient to introduce myself as Arboriculturist and to offer available resources.  Discussed one-time $1000 Radio broadcast assistant to assist with personal expenses to assist with personal expenses while going through treatment. Based on income guidelines, patient states her household exceeds the limit. Advised her to contact me should anything change.  Discussed copay assistance for Herceptin, Perjeta, and Udenyca. Patient gave me permission to enroll in programs. Advised patient I would enroll online and provide her with a copy of the approvals on 09/24/18 as well as my card. She was very appreciative and verbalized understanding.  Enrolled patient in [employer] assistance for Herceptin and Perjeta. Patient approved for 68-month eligibility benefit up to $25,000 effective 09/23/18 for Herceptin with her responsibility as little as $5 co-pay per infusion after insurance pays.  Patient approved for 54-month eligibility benefit up to $25,000 effective 09/23/18 for Perjeta with her responsibility as little as $5 co-pay per infusion after insurance pays.  Enrolled patient in [employer] for copay assistance for Udenyca. Patient approved effective 09/23/18 for up to a maximum benefit of $15,000 over the next 12 months leaving her with a $0 copay after insurance pays.

## 2018-09-24 ENCOUNTER — Encounter: Payer: Self-pay | Admitting: *Deleted

## 2018-09-24 ENCOUNTER — Inpatient Hospital Stay: Payer: BLUE CROSS/BLUE SHIELD

## 2018-09-24 ENCOUNTER — Other Ambulatory Visit: Payer: Self-pay

## 2018-09-24 VITALS — BP 140/97 | HR 92 | Temp 98.9°F | Resp 18

## 2018-09-24 DIAGNOSIS — C50411 Malignant neoplasm of upper-outer quadrant of right female breast: Secondary | ICD-10-CM

## 2018-09-24 MED ORDER — SODIUM CHLORIDE 0.9 % IV SOLN
420.0000 mg | Freq: Once | INTRAVENOUS | Status: AC
  Administered 2018-09-24: 11:00:00 420 mg via INTRAVENOUS
  Filled 2018-09-24: qty 14

## 2018-09-24 MED ORDER — DIPHENHYDRAMINE HCL 25 MG PO CAPS
ORAL_CAPSULE | ORAL | Status: AC
  Filled 2018-09-24: qty 2

## 2018-09-24 MED ORDER — SODIUM CHLORIDE 0.9 % IV SOLN
Freq: Once | INTRAVENOUS | Status: AC
  Administered 2018-09-24: 13:00:00 via INTRAVENOUS
  Filled 2018-09-24: qty 5

## 2018-09-24 MED ORDER — ACETAMINOPHEN 325 MG PO TABS
650.0000 mg | ORAL_TABLET | Freq: Once | ORAL | Status: AC
  Administered 2018-09-24: 650 mg via ORAL

## 2018-09-24 MED ORDER — SODIUM CHLORIDE 0.9 % IV SOLN
700.0000 mg | Freq: Once | INTRAVENOUS | Status: AC
  Administered 2018-09-24: 700 mg via INTRAVENOUS
  Filled 2018-09-24: qty 70

## 2018-09-24 MED ORDER — PALONOSETRON HCL INJECTION 0.25 MG/5ML
INTRAVENOUS | Status: AC
  Filled 2018-09-24: qty 5

## 2018-09-24 MED ORDER — PALONOSETRON HCL INJECTION 0.25 MG/5ML
0.2500 mg | Freq: Once | INTRAVENOUS | Status: AC
  Administered 2018-09-24: 0.25 mg via INTRAVENOUS

## 2018-09-24 MED ORDER — TRASTUZUMAB CHEMO 150 MG IV SOLR
8.0000 mg/kg | Freq: Once | INTRAVENOUS | Status: AC
  Administered 2018-09-24: 504 mg via INTRAVENOUS
  Filled 2018-09-24: qty 24

## 2018-09-24 MED ORDER — HEPARIN SOD (PORK) LOCK FLUSH 100 UNIT/ML IV SOLN
500.0000 [IU] | Freq: Once | INTRAVENOUS | Status: AC | PRN
  Administered 2018-09-24: 500 [IU]
  Filled 2018-09-24: qty 5

## 2018-09-24 MED ORDER — SODIUM CHLORIDE 0.9 % IV SOLN
75.0000 mg/m2 | Freq: Once | INTRAVENOUS | Status: AC
  Administered 2018-09-24: 130 mg via INTRAVENOUS
  Filled 2018-09-24: qty 13

## 2018-09-24 MED ORDER — DIPHENHYDRAMINE HCL 25 MG PO CAPS
50.0000 mg | ORAL_CAPSULE | Freq: Once | ORAL | Status: AC
  Administered 2018-09-24: 50 mg via ORAL

## 2018-09-24 MED ORDER — SODIUM CHLORIDE 0.9 % IV SOLN
Freq: Once | INTRAVENOUS | Status: AC
  Administered 2018-09-24: 08:00:00 via INTRAVENOUS
  Filled 2018-09-24: qty 250

## 2018-09-24 MED ORDER — SODIUM CHLORIDE 0.9% FLUSH
10.0000 mL | INTRAVENOUS | Status: DC | PRN
  Administered 2018-09-24: 10 mL
  Filled 2018-09-24: qty 10

## 2018-09-24 MED ORDER — ACETAMINOPHEN 325 MG PO TABS
ORAL_TABLET | ORAL | Status: AC
  Filled 2018-09-24: qty 2

## 2018-09-24 NOTE — Assessment & Plan Note (Deleted)
08/20/2018:Right lumpectomy: Grade 2 IDC with DCIS, 2.2 cm, margins negative, 1/1 lymph node positive with focal extranodal extension, ER 50%, PR 50%, HER-2 positive, 3+, Ki-67 10 to 15%, T2N1  Pathology counseling: I discussed the final pathology report of the patient provided  a copy of this report. I discussed the margins as well as lymph node surgeries. We also discussed the final staging along with previously performed ER/PR and HER-2/neu testing.  Recommendation: 1.  Adjuvant chemotherapy with [place] followed by Herceptin Perjeta maintenance 2.  Adjuvant radiation 3.  Adjuvant antiestrogen therapy with tamoxifen x10 years 4.  Adjuvant neratinib ---------------------------------------------------------------------------------------------------------------------------------------- Current treatment: Cycle 1 day 1 TCH Perjeta Echocardiogram 07/17/2018: EF 60 to 65% Labs reviewed Consent obtained Chemo education completed Antiemetics reviewed  Return to clinic 1 week for toxicity check

## 2018-09-24 NOTE — Patient Instructions (Signed)
Trastuzumab injection for infusion What is this medicine? TRASTUZUMAB (tras TOO zoo mab) is a monoclonal antibody. It is used to treat breast cancer and stomach cancer. This medicine may be used for other purposes; ask your health care provider or pharmacist if you have questions. COMMON BRAND NAME(S): Herceptin, KANJINTI, OGIVRI What should I tell my health care provider before I take this medicine? They need to know if you have any of these conditions: -heart disease -heart failure -lung or breathing disease, like asthma -an unusual or allergic reaction to trastuzumab, benzyl alcohol, or other medications, foods, dyes, or preservatives -pregnant or trying to get pregnant -breast-feeding How should I use this medicine? This drug is given as an infusion into a vein. It is administered in a hospital or clinic by a specially trained health care professional. Talk to your pediatrician regarding the use of this medicine in children. This medicine is not approved for use in children. Overdosage: If you think you have taken too much of this medicine contact a poison control center or emergency room at once. NOTE: This medicine is only for you. Do not share this medicine with others. What if I miss a dose? It is important not to miss a dose. Call your doctor or health care professional if you are unable to keep an appointment. What may interact with this medicine? This medicine may interact with the following medications: -certain types of chemotherapy, such as daunorubicin, doxorubicin, epirubicin, and idarubicin This list may not describe all possible interactions. Give your health care provider a list of all the medicines, herbs, non-prescription drugs, or dietary supplements you use. Also tell them if you smoke, drink alcohol, or use illegal drugs. Some items may interact with your medicine. What should I watch for while using this medicine? Visit your doctor for checks on your progress. Report  any side effects. Continue your course of treatment even though you feel ill unless your doctor tells you to stop. Call your doctor or health care professional for advice if you get a fever, chills or sore throat, or other symptoms of a cold or flu. Do not treat yourself. Try to avoid being around people who are sick. You may experience fever, chills and shaking during your first infusion. These effects are usually mild and can be treated with other medicines. Report any side effects during the infusion to your health care professional. Fever and chills usually do not happen with later infusions. Do not become pregnant while taking this medicine or for 7 months after stopping it. Women should inform their doctor if they wish to become pregnant or think they might be pregnant. Women of child-bearing potential will need to have a negative pregnancy test before starting this medicine. There is a potential for serious side effects to an unborn child. Talk to your health care professional or pharmacist for more information. Do not breast-feed an infant while taking this medicine or for 7 months after stopping it. Women must use effective birth control with this medicine. What side effects may I notice from receiving this medicine? Side effects that you should report to your doctor or health care professional as soon as possible: -allergic reactions like skin rash, itching or hives, swelling of the face, lips, or tongue -chest pain or palpitations -cough -dizziness -feeling faint or lightheaded, falls -fever -general ill feeling or flu-like symptoms -signs of worsening heart failure like breathing problems; swelling in your legs and feet -unusually weak or tired Side effects that usually do not   require medical attention (report to your doctor or health care professional if they continue or are bothersome): -bone pain -changes in taste -diarrhea -joint pain -nausea/vomiting -weight loss This list may  not describe all possible side effects. Call your doctor for medical advice about side effects. You may report side effects to FDA at 1-800-FDA-1088. Where should I keep my medicine? This drug is given in a hospital or clinic and will not be stored at home. NOTE: This sheet is a summary. It may not cover all possible information. If you have questions about this medicine, talk to your doctor, pharmacist, or health care provider.  2019 [employer] Standard (2016-03-20 14:37:52)  Pertuzumab injection What is this medicine? PERTUZUMAB (per TOOZ ue mab) is a monoclonal antibody. It is used to treat breast cancer. This medicine may be used for other purposes; ask your health care provider or pharmacist if you have questions. COMMON BRAND NAME(S): PERJETA What should I tell my health care provider before I take this medicine? They need to know if you have any of these conditions: -heart disease -heart failure -high blood pressure -history of irregular heart beat -recent or ongoing radiation therapy -an unusual or allergic reaction to pertuzumab, other medicines, foods, dyes, or preservatives -pregnant or trying to get pregnant -breast-feeding How should I use this medicine? This medicine is for infusion into a vein. It is given by a health care professional in a hospital or clinic setting. Talk to your pediatrician regarding the use of this medicine in children. Special care may be needed. Overdosage: If you think you have taken too much of this medicine contact a poison control center or emergency room at once. NOTE: This medicine is only for you. Do not share this medicine with others. What if I miss a dose? It is important not to miss your dose. Call your doctor or health care professional if you are unable to keep an appointment. What may interact with this medicine? Interactions are not expected. Give your health care provider a list of all the medicines, herbs, non-prescription  drugs, or dietary supplements you use. Also tell them if you smoke, drink alcohol, or use illegal drugs. Some items may interact with your medicine. This list may not describe all possible interactions. Give your health care provider a list of all the medicines, herbs, non-prescription drugs, or dietary supplements you use. Also tell them if you smoke, drink alcohol, or use illegal drugs. Some items may interact with your medicine. What should I watch for while using this medicine? Your condition will be monitored carefully while you are receiving this medicine. Report any side effects. Continue your course of treatment even though you feel ill unless your doctor tells you to stop. Do not become pregnant while taking this medicine or for 7 months after stopping it. Women should inform their doctor if they wish to become pregnant or think they might be pregnant. Women of child-bearing potential will need to have a negative pregnancy test before starting this medicine. There is a potential for serious side effects to an unborn child. Talk to your health care professional or pharmacist for more information. Do not breast-feed an infant while taking this medicine or for 7 months after stopping it. Women must use effective birth control with this medicine. Call your doctor or health care professional for advice if you get a fever, chills or sore throat, or other symptoms of a cold or flu. Do not treat yourself. Try to avoid being around people who   are sick. You may experience fever, chills, and headache during the infusion. Report any side effects during the infusion to your health care professional. What side effects may I notice from receiving this medicine? Side effects that you should report to your doctor or health care professional as soon as possible: -breathing problems -chest pain or palpitations -dizziness -feeling faint or lightheaded -fever or chills -skin rash, itching or hives -sore  throat -swelling of the face, lips, or tongue -swelling of the legs or ankles -unusually weak or tired Side effects that usually do not require medical attention (report to your doctor or health care professional if they continue or are bothersome): -diarrhea -hair loss -nausea, vomiting -tiredness This list may not describe all possible side effects. Call your doctor for medical advice about side effects. You may report side effects to FDA at 1-800-FDA-1088. Where should I keep my medicine? This drug is given in a hospital or clinic and will not be stored at home. NOTE: This sheet is a summary. It may not cover all possible information. If you have questions about this medicine, talk to your doctor, pharmacist, or health care provider.  2019 Elsevier/Gold Standard (2015-04-28 12:08:50)    Docetaxel injection What is this medicine? DOCETAXEL (doe se TAX el) is a chemotherapy drug. It targets fast dividing cells, like cancer cells, and causes these cells to die. This medicine is used to treat many types of cancers like breast cancer, certain stomach cancers, head and neck cancer, lung cancer, and prostate cancer. This medicine may be used for other purposes; ask your health care provider or pharmacist if you have questions. COMMON BRAND NAME(S): Docefrez, Taxotere What should I tell my health care provider before I take this medicine? They need to know if you have any of these conditions: -infection (especially a virus infection such as chickenpox, cold sores, or herpes) -liver disease -low blood counts, like low white cell, platelet, or red cell counts -an unusual or allergic reaction to docetaxel, polysorbate 80, other chemotherapy agents, other medicines, foods, dyes, or preservatives -pregnant or trying to get pregnant -breast-feeding How should I use this medicine? This drug is given as an infusion into a vein. It is administered in a hospital or clinic by a specially trained  health care professional. Talk to your pediatrician regarding the use of this medicine in children. Special care may be needed. Overdosage: If you think you have taken too much of this medicine contact a poison control center or emergency room at once. NOTE: This medicine is only for you. Do not share this medicine with others. What if I miss a dose? It is important not to miss your dose. Call your doctor or health care professional if you are unable to keep an appointment. What may interact with this medicine? -cyclosporine -erythromycin -ketoconazole -medicines to increase blood counts like filgrastim, pegfilgrastim, sargramostim -vaccines Talk to your doctor or health care professional before taking any of these medicines: -acetaminophen -aspirin -ibuprofen -ketoprofen -naproxen This list may not describe all possible interactions. Give your health care provider a list of all the medicines, herbs, non-prescription drugs, or dietary supplements you use. Also tell them if you smoke, drink alcohol, or use illegal drugs. Some items may interact with your medicine. What should I watch for while using this medicine? Your condition will be monitored carefully while you are receiving this medicine. You will need important blood work done while you are taking this medicine. This drug may make you feel generally unwell. This   is not uncommon, as chemotherapy can affect healthy cells as well as cancer cells. Report any side effects. Continue your course of treatment even though you feel ill unless your doctor tells you to stop. In some cases, you may be given additional medicines to help with side effects. Follow all directions for their use. Call your doctor or health care professional for advice if you get a fever, chills or sore throat, or other symptoms of a cold or flu. Do not treat yourself. This drug decreases your body's ability to fight infections. Try to avoid being around people who are  sick. This medicine may increase your risk to bruise or bleed. Call your doctor or health care professional if you notice any unusual bleeding. This medicine may contain alcohol in the product. You may get drowsy or dizzy. Do not drive, use machinery, or do anything that needs mental alertness until you know how this medicine affects you. Do not stand or sit up quickly, especially if you are an older patient. This reduces the risk of dizzy or fainting spells. Avoid alcoholic drinks. Do not become pregnant while taking this medicine or for 6 months after stopping it. Women should inform their doctor if they wish to become pregnant or think they might be pregnant. Men should not father a child while taking this medicine and for 3 months after stopping it. There is a potential for serious side effects to an unborn child. Talk to your health care professional or pharmacist for more information. Do not breast-feed an infant while taking this medicine or for 2 weeks after stopping it. This may interfere with the ability to father a child. You should talk to your doctor or health care professional if you are concerned about your fertility. What side effects may I notice from receiving this medicine? Side effects that you should report to your doctor or health care professional as soon as possible: -allergic reactions like skin rash, itching or hives, swelling of the face, lips, or tongue -low blood counts - This drug may decrease the number of white blood cells, red blood cells and platelets. You may be at increased risk for infections and bleeding. -signs of infection - fever or chills, cough, sore throat, pain or difficulty passing urine -signs of decreased platelets or bleeding - bruising, pinpoint red spots on the skin, black, tarry stools, nosebleeds -signs of decreased red blood cells - unusually weak or tired, fainting spells, lightheadedness -breathing problems -fast or irregular heartbeat -low blood  pressure -mouth sores -nausea and vomiting -pain, swelling, redness or irritation at the injection site -pain, tingling, numbness in the hands or feet -swelling of the ankle, feet, hands -weight gain Side effects that usually do not require medical attention (report to your doctor or health care professional if they continue or are bothersome): -bone pain -complete hair loss including hair on your head, underarms, pubic hair, eyebrows, and eyelashes -diarrhea -excessive tearing -changes in the color of fingernails -loosening of the fingernails -nausea -muscle pain -red flush to skin -sweating -weak or tired This list may not describe all possible side effects. Call your doctor for medical advice about side effects. You may report side effects to FDA at 1-800-FDA-1088. Where should I keep my medicine? This drug is given in a hospital or clinic and will not be stored at home. NOTE: This sheet is a summary. It may not cover all possible information. If you have questions about this medicine, talk to your doctor, pharmacist, or health care   provider.  2019 Else[employer]/Gold Standard (2017-04-22 12:07:21)    Carboplatin injection What is this medicine? CARBOPLATIN (KAR boe pla tin) is a chemotherapy drug. It targets fast dividing cells, like cancer cells, and causes these cells to die. This medicine is used to treat ovarian cancer and many other cancers. This medicine may be used for other purposes; ask your health care provider or pharmacist if you have questions. COMMON BRAND NAME(S): Paraplatin What should I tell my health care provider before I take this medicine? They need to know if you have any of these conditions: -blood disorders -hearing problems -kidney disease -recent or ongoing radiation therapy -an unusual or allergic reaction to carboplatin, cisplatin, other chemotherapy, other medicines, foods, dyes, or preservatives -pregnant or trying to get  pregnant -breast-feeding How should I use this medicine? This drug is usually given as an infusion into a vein. It is administered in a hospital or clinic by a specially trained health care professional. Talk to your pediatrician regarding the use of this medicine in children. Special care may be needed. Overdosage: If you think you have taken too much of this medicine contact a poison control center or emergency room at once. NOTE: This medicine is only for you. Do not share this medicine with others. What if I miss a dose? It is important not to miss a dose. Call your doctor or health care professional if you are unable to keep an appointment. What may interact with this medicine? -medicines for seizures -medicines to increase blood counts like filgrastim, pegfilgrastim, sargramostim -some antibiotics like amikacin, gentamicin, neomycin, streptomycin, tobramycin -vaccines Talk to your doctor or health care professional before taking any of these medicines: -acetaminophen -aspirin -ibuprofen -ketoprofen -naproxen This list may not describe all possible interactions. Give your health care provider a list of all the medicines, herbs, non-prescription drugs, or dietary supplements you use. Also tell them if you smoke, drink alcohol, or use illegal drugs. Some items may interact with your medicine. What should I watch for while using this medicine? Your condition will be monitored carefully while you are receiving this medicine. You will need important blood work done while you are taking this medicine. This drug may make you feel generally unwell. This is not uncommon, as chemotherapy can affect healthy cells as well as cancer cells. Report any side effects. Continue your course of treatment even though you feel ill unless your doctor tells you to stop. In some cases, you may be given additional medicines to help with side effects. Follow all directions for their use. Call your doctor or  health care professional for advice if you get a fever, chills or sore throat, or other symptoms of a cold or flu. Do not treat yourself. This drug decreases your body's ability to fight infections. Try to avoid being around people who are sick. This medicine may increase your risk to bruise or bleed. Call your doctor or health care professional if you notice any unusual bleeding. Be careful brushing and flossing your teeth or using a toothpick because you may get an infection or bleed more easily. If you have any dental work done, tell your dentist you are receiving this medicine. Avoid taking products that contain aspirin, acetaminophen, ibuprofen, naproxen, or ketoprofen unless instructed by your doctor. These medicines may hide a fever. Do not become pregnant while taking this medicine. Women should inform their doctor if they wish to become pregnant or think they might be pregnant. There is a potential for serious side effects   to an unborn child. Talk to your health care professional or pharmacist for more information. Do not breast-feed an infant while taking this medicine. What side effects may I notice from receiving this medicine? Side effects that you should report to your doctor or health care professional as soon as possible: -allergic reactions like skin rash, itching or hives, swelling of the face, lips, or tongue -signs of infection - fever or chills, cough, sore throat, pain or difficulty passing urine -signs of decreased platelets or bleeding - bruising, pinpoint red spots on the skin, black, tarry stools, nosebleeds -signs of decreased red blood cells - unusually weak or tired, fainting spells, lightheadedness -breathing problems -changes in hearing -changes in vision -chest pain -high blood pressure -low blood counts - This drug may decrease the number of white blood cells, red blood cells and platelets. You may be at increased risk for infections and bleeding. -nausea and  vomiting -pain, swelling, redness or irritation at the injection site -pain, tingling, numbness in the hands or feet -problems with balance, talking, walking -trouble passing urine or change in the amount of urine Side effects that usually do not require medical attention (report to your doctor or health care professional if they continue or are bothersome): -hair loss -loss of appetite -metallic taste in the mouth or changes in taste This list may not describe all possible side effects. Call your doctor for medical advice about side effects. You may report side effects to FDA at 1-800-FDA-1088. Where should I keep my medicine? This drug is given in a hospital or clinic and will not be stored at home. NOTE: This sheet is a summary. It may not cover all possible information. If you have questions about this medicine, talk to your doctor, pharmacist, or health care provider.  2019 Elsevier/Gold Standard (2007-07-01 14:38:05)   

## 2018-09-25 ENCOUNTER — Other Ambulatory Visit: Payer: Self-pay

## 2018-09-25 ENCOUNTER — Telehealth: Payer: Self-pay | Admitting: Hematology and Oncology

## 2018-09-25 ENCOUNTER — Telehealth: Payer: Self-pay

## 2018-09-25 ENCOUNTER — Inpatient Hospital Stay: Payer: BLUE CROSS/BLUE SHIELD

## 2018-09-25 VITALS — BP 164/83 | HR 97 | Temp 98.5°F | Resp 18

## 2018-09-25 DIAGNOSIS — C50411 Malignant neoplasm of upper-outer quadrant of right female breast: Secondary | ICD-10-CM

## 2018-09-25 MED ORDER — PEGFILGRASTIM-CBQV 6 MG/0.6ML ~~LOC~~ SOSY
6.0000 mg | PREFILLED_SYRINGE | Freq: Once | SUBCUTANEOUS | Status: AC
  Administered 2018-09-25: 6 mg via SUBCUTANEOUS

## 2018-09-25 MED ORDER — PEGFILGRASTIM-CBQV 6 MG/0.6ML ~~LOC~~ SOSY
PREFILLED_SYRINGE | SUBCUTANEOUS | Status: AC
  Filled 2018-09-25: qty 0.6

## 2018-09-25 NOTE — Patient Instructions (Signed)
Pegfilgrastim injection  What is this medicine?  PEGFILGRASTIM (PEG fil gra stim) is a long-acting granulocyte colony-stimulating factor that stimulates the growth of neutrophils, a type of white blood cell important in the body's fight against infection. It is used to reduce the incidence of fever and infection in patients with certain types of cancer who are receiving chemotherapy that affects the bone marrow, and to increase survival after being exposed to high doses of radiation.  This medicine may be used for other purposes; ask your health care provider or pharmacist if you have questions.  COMMON BRAND NAME(S): Fulphila, Neulasta, UDENYCA  What should I tell my health care provider before I take this medicine?  They need to know if you have any of these conditions:  -kidney disease  -latex allergy  -ongoing radiation therapy  -sickle cell disease  -skin reactions to acrylic adhesives (On-Body Injector only)  -an unusual or allergic reaction to pegfilgrastim, filgrastim, other medicines, foods, dyes, or preservatives  -pregnant or trying to get pregnant  -breast-feeding  How should I use this medicine?  This medicine is for injection under the skin. If you get this medicine at home, you will be taught how to prepare and give the pre-filled syringe or how to use the On-body Injector. Refer to the patient Instructions for Use for detailed instructions. Use exactly as directed. Tell your healthcare provider immediately if you suspect that the On-body Injector may not have performed as intended or if you suspect the use of the On-body Injector resulted in a missed or partial dose.  It is important that you put your used needles and syringes in a special sharps container. Do not put them in a trash can. If you do not have a sharps container, call your pharmacist or healthcare provider to get one.  Talk to your pediatrician regarding the use of this medicine in children. While this drug may be prescribed for  selected conditions, precautions do apply.  Overdosage: If you think you have taken too much of this medicine contact a poison control center or emergency room at once.  NOTE: This medicine is only for you. Do not share this medicine with others.  What if I miss a dose?  It is important not to miss your dose. Call your doctor or health care professional if you miss your dose. If you miss a dose due to an On-body Injector failure or leakage, a new dose should be administered as soon as possible using a single prefilled syringe for manual use.  What may interact with this medicine?  Interactions have not been studied.  Give your health care provider a list of all the medicines, herbs, non-prescription drugs, or dietary supplements you use. Also tell them if you smoke, drink alcohol, or use illegal drugs. Some items may interact with your medicine.  This list may not describe all possible interactions. Give your health care provider a list of all the medicines, herbs, non-prescription drugs, or dietary supplements you use. Also tell them if you smoke, drink alcohol, or use illegal drugs. Some items may interact with your medicine.  What should I watch for while using this medicine?  You may need blood work done while you are taking this medicine.  If you are going to need a MRI, CT scan, or other procedure, tell your doctor that you are using this medicine (On-Body Injector only).  What side effects may I notice from receiving this medicine?  Side effects that you should report to   your doctor or health care professional as soon as possible:  -allergic reactions like skin rash, itching or hives, swelling of the face, lips, or tongue  -back pain  -dizziness  -fever  -pain, redness, or irritation at site where injected  -pinpoint red spots on the skin  -red or dark-brown urine  -shortness of breath or breathing problems  -stomach or side pain, or pain at the shoulder  -swelling  -tiredness  -trouble passing urine or  change in the amount of urine  Side effects that usually do not require medical attention (report to your doctor or health care professional if they continue or are bothersome):  -bone pain  -muscle pain  This list may not describe all possible side effects. Call your doctor for medical advice about side effects. You may report side effects to FDA at 1-800-FDA-1088.  Where should I keep my medicine?  Keep out of the reach of children.  If you are using this medicine at home, you will be instructed on how to store it. Throw away any unused medicine after the expiration date on the label.  NOTE: This sheet is a summary. It may not cover all possible information. If you have questions about this medicine, talk to your doctor, pharmacist, or health care provider.   2019 Elsevier/Gold Standard (2017-07-01 16:57:08)

## 2018-09-25 NOTE — Telephone Encounter (Signed)
Returned call re reschedule 6/24 lab/fu. Confirmed new appointment for 6/23 with patient.

## 2018-09-25 NOTE — Telephone Encounter (Signed)
RN left voicemail for return call to follow up after 1st treatment.

## 2018-09-26 ENCOUNTER — Ambulatory Visit: Payer: BLUE CROSS/BLUE SHIELD | Admitting: Hematology and Oncology

## 2018-09-26 ENCOUNTER — Other Ambulatory Visit: Payer: BLUE CROSS/BLUE SHIELD

## 2018-09-29 NOTE — Progress Notes (Signed)
Patient Care Team: [name], MD as PCP - General (Internal Medicine) Mauro Kaufmann, RN as Oncology Nurse Navigator [name], RN as Oncology Nurse Navigator  DIAGNOSIS:    ICD-10-CM   1. Malignant neoplasm of upper-outer quadrant of right breast in female, estrogen receptor positive ([place])  C50.411    Z17.0     SUMMARY OF ONCOLOGIC HISTORY: Oncology History  Malignant neoplasm of upper-outer quadrant of right breast in female, estrogen receptor positive ([place])  06/26/2018 Initial Diagnosis   [place]: Screening mammogram detected focal asymmetry in the right breast, ultrasound revealed 2 cm oval mass: Biopsy revealed grade 1-2 invasive ductal carcinoma, ER 3+, PR 3+, HER-2 3+ positive T1c M0 stage Ia clinical stage   07/23/2018 Cancer Staging   Staging form: Breast, AJCC 8th Edition - Clinical stage from 07/23/2018: Stage IA (cT1c, cN0, cM0, G2, ER+, PR+, HER2+) - Signed by Nicholas Lose, MD on 07/23/2018   08/20/2018 Surgery   Right lumpectomy: Grade 2 IDC with DCIS, 2.2 cm, margins negative, 1/1 lymph node positive with focal extranodal extension, ER 50%, PR 50%, HER-2 positive, 3+, Ki-67 10 to 15%, T2N1   09/03/2018 Cancer Staging   Staging form: Breast, AJCC 8th Edition - Pathologic: Stage IB (pT2, pN1a, cM0, G2, ER+, PR+, HER2+) - Signed by Gardenia Phlegm, NP on 09/03/2018   09/23/2018 -  Chemotherapy   Adjuvant chemo with [facility] Perjeta     CHIEF COMPLIANT: Cycle 1 day 8 TCHP  INTERVAL HISTORY: Megan Acevedo is a 48 y.o. with above-mentioned history of HER2 positive right breast cancer who underwent a lumpectomy and is currently on adjuvant chemotherapy with TCHP. She had some insurance issues getting coverage for treatment, but she received approval and decided to continue receiving care here, as opposed to in [place]. She presents to the clinic today for cycle 1 day 8. After chemotherapy on day 3 she started getting esophagitis.  She also  developed profound fatigue.  She had loose stools for couple of days for which she took Imodium.  She was working 12-hour shifts throughout the entire time.  She denies any nausea or vomiting.  Does not have any significant bone pain because she is taking Claritin.  She is working as an Corporate treasurer.  REVIEW OF SYSTEMS:   Constitutional: Denies fevers, chills or abnormal weight loss, complains of fatigue Eyes: Denies blurriness of vision Ears, nose, mouth, throat, and face: Denies mucositis or sore throat Respiratory: Denies cough, dyspnea or wheezes Cardiovascular: Denies palpitation, chest discomfort Gastrointestinal: Esophagitis after treatment Skin: Denies abnormal skin rashes Lymphatics: Denies new lymphadenopathy or easy bruising Neurological: Denies numbness, tingling or new weaknesses Behavioral/Psych: Mood is stable, no new changes  Extremities: No lower extremity edema Breast: denies any pain or lumps or nodules in either breasts All other systems were reviewed with the patient and are negative.  I have reviewed the past medical history, past surgical history, social history and family history with the patient and they are unchanged from previous note.  ALLERGIES:  has No Known Allergies.  MEDICATIONS:  Current Outpatient Medications  Medication Sig Dispense Refill  . acetaminophen (TYLENOL) 325 MG tablet Take 650 mg by mouth every 6 (six) hours as needed.    . benzoyl peroxide-erythromycin (BENZAMYCIN) gel Apply topically 2 (two) times daily. 23.3 g 0  . dexamethasone (DECADRON) 4 MG tablet Take 1 tablet (4 mg total) by mouth daily. Take 1 tablet day before chemo and 1 tablet day after chemo with  food 12 tablet 0  . lidocaine-prilocaine (EMLA) cream Apply to affected area once 30 g 3  . LORazepam (ATIVAN) 0.5 MG tablet Take 1 tablet (0.5 mg total) by mouth at bedtime as needed (Nausea or vomiting). 30 tablet 0  . magic mouthwash w/lidocaine SOLN Take 5 mLs by mouth 3 (three) times  daily as needed for mouth pain. 100 mL 1  . Multiple Vitamins-Minerals (CENTRUM SILVER 50+WOMEN) TABS Take 1 each by mouth every morning.    Marland Kitchen omeprazole (PRILOSEC) 20 MG capsule Take 20 mg by mouth daily.    . ondansetron (ZOFRAN) 8 MG tablet Take 1 tablet (8 mg total) by mouth 2 (two) times daily as needed (Nausea or vomiting). Begin 4 days after chemotherapy. 30 tablet 1  . oxybutynin (DITROPAN) 5 MG tablet Take 5 mg by mouth 3 (three) times daily.    Marland Kitchen oxyCODONE (OXY IR/ROXICODONE) 5 MG immediate release tablet Take 1 tablet (5 mg total) by mouth every 6 (six) hours as needed for moderate pain, severe pain or breakthrough pain. 12 tablet 0  . prochlorperazine (COMPAZINE) 10 MG tablet Take 1 tablet (10 mg total) by mouth every 6 (six) hours as needed (Nausea or vomiting). 30 tablet 1   No current facility-administered medications for this visit.     PHYSICAL EXAMINATION: ECOG PERFORMANCE STATUS: 1 - Symptomatic but completely ambulatory  Vitals:   09/30/18 0940  BP: 140/70  Pulse: 97  Resp: 18  Temp: 98.3 F (36.8 C)  SpO2: 100%   Filed Weights   09/30/18 0940  Weight: 132 lb 3.2 oz (60 kg)    GENERAL: alert, no distress and comfortable SKIN: skin color, texture, turgor are normal, no rashes or significant lesions EYES: normal, Conjunctiva are pink and non-injected, sclera clear OROPHARYNX: no exudate, no erythema and lips, buccal mucosa, and tongue normal  NECK: supple, thyroid normal size, non-tender, without nodularity LYMPH: no palpable lymphadenopathy in the cervical, axillary or inguinal LUNGS: clear to auscultation and percussion with normal breathing effort HEART: regular rate & rhythm and no murmurs and no lower extremity edema ABDOMEN: abdomen soft, non-tender and normal bowel sounds MUSCULOSKELETAL: no cyanosis of digits and no clubbing  NEURO: alert & oriented x 3 with fluent speech, no focal motor/sensory deficits EXTREMITIES: No lower extremity edema   LABORATORY DATA:  I have reviewed the data as listed CMP Latest Ref Rng & Units 09/30/2018 09/19/2018  Glucose 70 - 99 mg/dL 100(H) 110(H)  BUN 6 - 20 mg/dL 8 11  Creatinine 0.44 - 1.00 mg/dL 0.82 0.74  Sodium 135 - 145 mmol/L 137 139  Potassium 3.5 - 5.1 mmol/L 4.1 3.6  Chloride 98 - 111 mmol/L 104 106  CO2 22 - 32 mmol/L 25 23  Calcium 8.9 - 10.3 mg/dL 8.9 8.8(L)  Total Protein 6.5 - 8.1 g/dL 7.4 7.6  Total Bilirubin 0.3 - 1.2 mg/dL 0.5 0.7  Alkaline Phos 38 - 126 U/L 98 79  AST 15 - 41 U/L 24 17  ALT 0 - 44 U/L 33 19    Lab Results  Component Value Date   WBC 4.9 09/30/2018   HGB 14.2 09/30/2018   HCT 43.1 09/30/2018   MCV 91.5 09/30/2018   PLT 206 09/30/2018   NEUTROABS 2.1 09/30/2018    ASSESSMENT & PLAN:  Malignant neoplasm of upper-outer quadrant of right breast in female, estrogen receptor positive ([place]) 08/20/2018:Right lumpectomy: Grade 2 IDC with DCIS, 2.2 cm, margins negative, 1/1 lymph node positive with focal extranodal extension, ER  50%, PR 50%, HER-2 positive, 3+, Ki-67 10 to 15%, T2N1  Pathology counseling: I discussed the final pathology report of the patient provided  a copy of this report. I discussed the margins as well as lymph node surgeries. We also discussed the final staging along with previously performed ER/PR and HER-2/neu testing.  Recommendation: 1.  Adjuvant chemotherapy with [place] followed by Herceptin Perjeta maintenance 2.  Adjuvant radiation 3.  Adjuvant antiestrogen therapy with tamoxifen x10 years 4.  Adjuvant neratinib ---------------------------------------------------------------------------------------------------------------------------------------- Current treatment: Cycle 1 day 8 [facility] Perjeta Echocardiogram 07/17/2018: EF 60 to 65%  Toxicities: 1.  Fatigue 2.  Mild diarrhea 3.  Esophagitis: I sent a prescription for Magic mouthwash.   Return to clinic in 2 weeks for cycle 2     No orders of the defined types were  placed in this encounter.  The patient has a good understanding of the overall plan. she agrees with it. she will call with any problems that may develop before the next visit here.  Nicholas Lose, MD 09/30/2018  Julious Oka Dorshimer am acting as scribe for Dr. Nicholas Lose.  I have reviewed the above documentation for accuracy and completeness, and I agree with the above.

## 2018-09-30 ENCOUNTER — Inpatient Hospital Stay (HOSPITAL_BASED_OUTPATIENT_CLINIC_OR_DEPARTMENT_OTHER): Payer: BLUE CROSS/BLUE SHIELD | Admitting: Hematology and Oncology

## 2018-09-30 ENCOUNTER — Other Ambulatory Visit: Payer: Self-pay

## 2018-09-30 ENCOUNTER — Inpatient Hospital Stay: Payer: BLUE CROSS/BLUE SHIELD

## 2018-09-30 DIAGNOSIS — Z170 Estrogen receptor positive status [ER+]: Secondary | ICD-10-CM | POA: Diagnosis not present

## 2018-09-30 DIAGNOSIS — Z79899 Other long term (current) drug therapy: Secondary | ICD-10-CM

## 2018-09-30 DIAGNOSIS — C50411 Malignant neoplasm of upper-outer quadrant of right female breast: Secondary | ICD-10-CM

## 2018-09-30 DIAGNOSIS — Z79810 Long term (current) use of selective estrogen receptor modulators (SERMs): Secondary | ICD-10-CM | POA: Diagnosis not present

## 2018-09-30 LAB — CMP (CANCER CENTER ONLY)
ALT: 33 U/L (ref 0–44)
AST: 24 U/L (ref 15–41)
Albumin: 4.2 g/dL (ref 3.5–5.0)
Alkaline Phosphatase: 98 U/L (ref 38–126)
Anion gap: 8 (ref 5–15)
BUN: 8 mg/dL (ref 6–20)
CO2: 25 mmol/L (ref 22–32)
Calcium: 8.9 mg/dL (ref 8.9–10.3)
Chloride: 104 mmol/L (ref 98–111)
Creatinine: 0.82 mg/dL (ref 0.44–1.00)
GFR, Est AFR Am: >60 mL/min (ref >60)
GFR, Estimated: >60 mL/min (ref >60)
Glucose, Bld: 100 mg/dL — ABNORMAL HIGH (ref 70–99)
Potassium: 4.1 mmol/L (ref 3.5–5.1)
Sodium: 137 mmol/L (ref 135–145)
Total Bilirubin: 0.5 mg/dL (ref 0.3–1.2)
Total Protein: 7.4 g/dL (ref 6.5–8.1)

## 2018-09-30 LAB — CBC WITH DIFFERENTIAL (CANCER CENTER ONLY)
Abs Immature Granulocytes: 0.18 10*3/uL — ABNORMAL HIGH (ref 0.00–0.07)
Basophils Absolute: 0.1 10*3/uL (ref 0.0–0.1)
Basophils Relative: 2 %
Eosinophils Absolute: 0.1 10*3/uL (ref 0.0–0.5)
Eosinophils Relative: 1 %
HCT: 43.1 % (ref 36.0–46.0)
Hemoglobin: 14.2 g/dL (ref 12.0–15.0)
Immature Granulocytes: 4 %
Lymphocytes Relative: 35 %
Lymphs Abs: 1.8 10*3/uL (ref 0.7–4.0)
MCH: 30.1 pg (ref 26.0–34.0)
MCHC: 32.9 g/dL (ref 30.0–36.0)
MCV: 91.5 fL (ref 80.0–100.0)
Monocytes Absolute: 0.7 10*3/uL (ref 0.1–1.0)
Monocytes Relative: 15 %
Neutro Abs: 2.1 10*3/uL (ref 1.7–7.7)
Neutrophils Relative %: 43 %
Platelet Count: 206 10*3/uL (ref 150–400)
RBC: 4.71 MIL/uL (ref 3.87–5.11)
RDW: 12.1 % (ref 11.5–15.5)
WBC Count: 4.9 10*3/uL (ref 4.0–10.5)
nRBC: 0 % (ref 0.0–0.2)

## 2018-09-30 MED ORDER — MAGIC MOUTHWASH W/LIDOCAINE: 5.0000 mL | Freq: Three times a day (TID) | ORAL | 1 refills | Status: DC | PRN

## 2018-09-30 MED ORDER — BENZOYL PEROXIDE-ERYTHROMYCIN 5-3 % EX GEL: Freq: Two times a day (BID) | CUTANEOUS | 0 refills | Status: DC

## 2018-09-30 NOTE — Assessment & Plan Note (Signed)
08/20/2018:Right lumpectomy: Grade 2 IDC with DCIS, 2.2 cm, margins negative, 1/1 lymph node positive with focal extranodal extension, ER 50%, PR 50%, HER-2 positive, 3+, Ki-67 10 to 15%, T2N1  Pathology counseling: I discussed the final pathology report of the patient provided  a copy of this report. I discussed the margins as well as lymph node surgeries. We also discussed the final staging along with previously performed ER/PR and HER-2/neu testing.  Recommendation: 1.  Adjuvant chemotherapy with [place] followed by Herceptin Perjeta maintenance 2.  Adjuvant radiation 3.  Adjuvant antiestrogen therapy with tamoxifen x10 years 4.  Adjuvant neratinib ---------------------------------------------------------------------------------------------------------------------------------------- Current treatment: Cycle 1 day 8 T[facility] Perjeta Echocardiogram 07/17/2018: EF 60 to 65%  Toxicities: 1.  Fatigue 2.  Mild diarrhea 3.  Esophagitis: I sent a prescription for Magic mouthwash.   Return to clinic in 2 weeks for cycle 2

## 2018-10-01 ENCOUNTER — Inpatient Hospital Stay: Payer: BLUE CROSS/BLUE SHIELD | Admitting: Hematology and Oncology

## 2018-10-01 ENCOUNTER — Inpatient Hospital Stay: Payer: BLUE CROSS/BLUE SHIELD

## 2018-10-08 NOTE — Assessment & Plan Note (Signed)
08/20/2018:Right lumpectomy: Grade 2 IDC with DCIS, 2.2 cm, margins negative, 1/1 lymph node positive with focal extranodal extension, ER 50%, PR 50%, HER-2 positive, 3+, Ki-67 10 to 15%, T2N1  Pathology counseling: I discussed the final pathology report of the patient provided a copy of this report. I discussed the margins as well as lymph node surgeries. We also discussed the final staging along with previously performed ER/PR and HER-2/neu testing.  Recommendation: 1.Adjuvant chemotherapy with [place] followed by Herceptin Perjeta maintenance 2.Adjuvant radiation 3.Adjuvant antiestrogen therapy with tamoxifen x10 years 4.Adjuvant neratinib ---------------------------------------------------------------------------------------------------------------------------------------- Current treatment: Cycle 2 day 1 TCH Perjeta Echocardiogram 07/17/2018: EF 60 to 65%  Toxicities: 1.  Fatigue 2.  Mild diarrhea 3.  Esophagitis: I sent a prescription for Magic mouthwash.   Return to clinic in  3 weeks for cycle 3

## 2018-10-09 ENCOUNTER — Ambulatory Visit: Payer: BLUE CROSS/BLUE SHIELD | Admitting: Hematology and Oncology

## 2018-10-09 ENCOUNTER — Other Ambulatory Visit: Payer: BLUE CROSS/BLUE SHIELD

## 2018-10-09 ENCOUNTER — Ambulatory Visit: Payer: BLUE CROSS/BLUE SHIELD

## 2018-10-11 ENCOUNTER — Inpatient Hospital Stay: Payer: BLUE CROSS/BLUE SHIELD

## 2018-10-14 NOTE — Progress Notes (Signed)
Patient Care Team: [name], MD as PCP - General (Internal Medicine) Mauro Kaufmann, RN as Oncology Nurse Navigator [name], RN as Oncology Nurse Navigator  DIAGNOSIS:    ICD-10-CM   1. Malignant neoplasm of upper-outer quadrant of right breast in female, estrogen receptor positive ([place])  C50.411    Z17.0     SUMMARY OF ONCOLOGIC HISTORY: Oncology History  Malignant neoplasm of upper-outer quadrant of right breast in female, estrogen receptor positive ([place])  06/26/2018 Initial Diagnosis   [place]: Screening mammogram detected focal asymmetry in the right breast, ultrasound revealed 2 cm oval mass: Biopsy revealed grade 1-2 invasive ductal carcinoma, ER 3+, PR 3+, HER-2 3+ positive T1c M0 stage Ia clinical stage   07/23/2018 Cancer Staging   Staging form: Breast, AJCC 8th Edition - Clinical stage from 07/23/2018: Stage IA (cT1c, cN0, cM0, G2, ER+, PR+, HER2+) - Signed by Megan Lose, MD on 07/23/2018   08/20/2018 Surgery   Right lumpectomy: Grade 2 IDC with DCIS, 2.2 cm, margins negative, 1/1 lymph node positive with focal extranodal extension, ER 50%, PR 50%, HER-2 positive, 3+, Ki-67 10 to 15%, T2N1   09/03/2018 Cancer Staging   Staging form: Breast, AJCC 8th Edition - Pathologic: Stage IB (pT2, pN1a, cM0, G2, ER+, PR+, HER2+) - Signed by Gardenia Phlegm, NP on 09/03/2018   09/23/2018 -  Chemotherapy   Adjuvant chemo with [place] Perjeta     CHIEF COMPLIANT: Cycle 2 TCHP  INTERVAL HISTORY: Megan Acevedo is a 48 y.o. with above-mentioned history of HER2 positive right breast cancer who underwent a lumpectomy and is currently on adjuvant chemotherapy with TCHP. She presents to the clinic today for cycle 2.   REVIEW OF SYSTEMS:   Constitutional: Denies fevers, chills or abnormal weight loss Eyes: Denies blurriness of vision Ears, nose, mouth, throat, and face: Denies mucositis or sore throat Respiratory: Denies cough, dyspnea or wheezes  Cardiovascular: Denies palpitation, chest discomfort Gastrointestinal: Denies nausea, heartburn or change in bowel habits Skin: Denies abnormal skin rashes Lymphatics: Denies new lymphadenopathy or easy bruising Neurological: Denies numbness, tingling or new weaknesses Behavioral/Psych: Mood is stable, no new changes  Extremities: No lower extremity edema Breast: denies any pain or lumps or nodules in either breasts All other systems were reviewed with the patient and are negative.  I have reviewed the past medical history, past surgical history, social history and family history with the patient and they are unchanged from previous note.  ALLERGIES:  has No Known Allergies.  MEDICATIONS:  Current Outpatient Medications  Medication Sig Dispense Refill  . acetaminophen (TYLENOL) 325 MG tablet Take 650 mg by mouth every 6 (six) hours as needed.    . benzoyl peroxide-erythromycin (BENZAMYCIN) gel Apply topically 2 (two) times daily. 23.3 g 0  . dexamethasone (DECADRON) 4 MG tablet Take 1 tablet (4 mg total) by mouth daily. Take 1 tablet day before chemo and 1 tablet day after chemo with food 12 tablet 0  . lidocaine-prilocaine (EMLA) cream Apply to affected area once 30 g 3  . LORazepam (ATIVAN) 0.5 MG tablet Take 1 tablet (0.5 mg total) by mouth at bedtime as needed (Nausea or vomiting). 30 tablet 0  . magic mouthwash w/lidocaine SOLN Take 5 mLs by mouth 3 (three) times daily as needed for mouth pain. 100 mL 1  . Multiple Vitamins-Minerals (CENTRUM SILVER 50+WOMEN) TABS Take 1 each by mouth every morning.    Marland Kitchen omeprazole (PRILOSEC) 20 MG capsule Take 20 mg  by mouth daily.    . ondansetron (ZOFRAN) 8 MG tablet Take 1 tablet (8 mg total) by mouth 2 (two) times daily as needed (Nausea or vomiting). Begin 4 days after chemotherapy. 30 tablet 1  . oxybutynin (DITROPAN) 5 MG tablet Take 5 mg by mouth 3 (three) times daily.    Marland Kitchen oxyCODONE (OXY IR/ROXICODONE) 5 MG immediate release tablet Take 1  tablet (5 mg total) by mouth every 6 (six) hours as needed for moderate pain, severe pain or breakthrough pain. 12 tablet 0  . prochlorperazine (COMPAZINE) 10 MG tablet Take 1 tablet (10 mg total) by mouth every 6 (six) hours as needed (Nausea or vomiting). 30 tablet 1   No current facility-administered medications for this visit.    Facility-Administered Medications Ordered in Other Visits  Medication Dose Route Frequency Provider Last Rate Last Dose  . CARBOplatin (PARAPLATIN) 700 mg in sodium chloride 0.9 % 250 mL chemo infusion  700 mg Intravenous Once Megan Lose, MD      . DOCEtaxel (TAXOTERE) 130 mg in sodium chloride 0.9 % 250 mL chemo infusion  75 mg/m2 (Treatment Plan Recorded) Intravenous Once Megan Lose, MD      . heparin lock flush 100 unit/mL  500 Units Intracatheter Once PRN Megan Lose, MD      . pertuzumab (PERJETA) 420 mg in sodium chloride 0.9 % 250 mL chemo infusion  420 mg Intravenous Once Megan Lose, MD      . sodium chloride flush (NS) 0.9 % injection 10 mL  10 mL Intracatheter PRN Megan Lose, MD      . trastuzumab (HERCEPTIN) 378 mg in sodium chloride 0.9 % 250 mL chemo infusion  6 mg/kg (Treatment Plan Recorded) Intravenous Once Megan Lose, MD 536 mL/hr at 10/15/18 1101 378 mg at 10/15/18 1101    PHYSICAL EXAMINATION: ECOG PERFORMANCE STATUS: 1 - Symptomatic but completely ambulatory  Vitals:   10/15/18 0859  BP: 140/71  Pulse: 82  Resp: 18  Temp: 98.7 F (37.1 C)  SpO2: 100%   Filed Weights   10/15/18 0859  Weight: 136 lb 8 oz (61.9 kg)    Physical exam not done due to COVID-19 precautions  LABORATORY DATA:  I have reviewed the data as listed CMP Latest Ref Rng & Units 10/15/2018 09/30/2018 09/19/2018  Glucose 70 - 99 mg/dL 84 100(H) 110(H)  BUN 6 - 20 mg/dL '16 8 11  ' Creatinine 0.44 - 1.00 mg/dL 0.67 0.82 0.74  Sodium 135 - 145 mmol/L 139 137 139  Potassium 3.5 - 5.1 mmol/L 3.4(L) 4.1 3.6  Chloride 98 - 111 mmol/L 107 104 106  CO2 22 -  32 mmol/L '23 25 23  ' Calcium 8.9 - 10.3 mg/dL 8.7(L) 8.9 8.8(L)  Total Protein 6.5 - 8.1 g/dL 7.0 7.4 7.6  Total Bilirubin 0.3 - 1.2 mg/dL 0.6 0.5 0.7  Alkaline Phos 38 - 126 U/L 71 98 79  AST 15 - 41 U/L '17 24 17  ' ALT 0 - 44 U/L 27 33 19    Lab Results  Component Value Date   WBC 10.8 (H) 10/15/2018   HGB 12.2 10/15/2018   HCT 36.1 10/15/2018   MCV 90.7 10/15/2018   PLT 342 10/15/2018   NEUTROABS 7.4 10/15/2018    ASSESSMENT & PLAN:  Malignant neoplasm of upper-outer quadrant of right breast in female, estrogen receptor positive ([place]) 08/20/2018:Right lumpectomy: Grade 2 IDC with DCIS, 2.2 cm, margins negative, 1/1 lymph node positive with focal extranodal extension, ER 50%, PR 50%, HER-2  positive, 3+, Ki-67 10 to 15%, T2N1  Pathology counseling: I discussed the final pathology report of the patient provided a copy of this report. I discussed the margins as well as lymph node surgeries. We also discussed the final staging along with previously performed ER/PR and HER-2/neu testing.  Recommendation: 1.Adjuvant chemotherapy with [place] followed by Herceptin Perjeta maintenance 2.Adjuvant radiation 3.Adjuvant antiestrogen therapy with tamoxifen x10 years 4.Adjuvant neratinib ---------------------------------------------------------------------------------------------------------------------------------------- Current treatment: Cycle 2 day 1 TCH Perjeta Echocardiogram 07/17/2018: EF 60 to 65%  Toxicities: 1.  Fatigue 2.  Mild diarrhea: Well responding to Imodium. 3.  Esophagitis: I sent a prescription for Magic mouthwash.:  Much improved 4.  Eyelid fluttering: Probably due to mild hypokalemia.  Encouraged her to eat potassium containing foods.  Return to clinic in  3 weeks for cycle 3    No orders of the defined types were placed in this encounter.  The patient has a good understanding of the overall plan. she agrees with it. she will call with any problems  that may develop before the next visit here.  Megan Lose, MD 10/15/2018  Julious Oka Dorshimer am acting as scribe for Dr. Nicholas Acevedo.  I have reviewed the above documentation for accuracy and completeness, and I agree with the above.

## 2018-10-15 ENCOUNTER — Inpatient Hospital Stay: Payer: BLUE CROSS/BLUE SHIELD | Attending: Hematology and Oncology

## 2018-10-15 ENCOUNTER — Inpatient Hospital Stay: Payer: BLUE CROSS/BLUE SHIELD

## 2018-10-15 ENCOUNTER — Inpatient Hospital Stay (HOSPITAL_BASED_OUTPATIENT_CLINIC_OR_DEPARTMENT_OTHER): Payer: BLUE CROSS/BLUE SHIELD | Admitting: Hematology and Oncology

## 2018-10-15 ENCOUNTER — Encounter: Payer: Self-pay | Admitting: *Deleted

## 2018-10-15 ENCOUNTER — Other Ambulatory Visit: Payer: Self-pay

## 2018-10-15 VITALS — BP 126/76 | HR 79 | Temp 98.1°F | Resp 16

## 2018-10-15 DIAGNOSIS — C50411 Malignant neoplasm of upper-outer quadrant of right female breast: Secondary | ICD-10-CM

## 2018-10-15 DIAGNOSIS — Z79899 Other long term (current) drug therapy: Secondary | ICD-10-CM | POA: Insufficient documentation

## 2018-10-15 DIAGNOSIS — Z5112 Encounter for antineoplastic immunotherapy: Secondary | ICD-10-CM | POA: Insufficient documentation

## 2018-10-15 DIAGNOSIS — Z5111 Encounter for antineoplastic chemotherapy: Secondary | ICD-10-CM | POA: Diagnosis present

## 2018-10-15 DIAGNOSIS — Z170 Estrogen receptor positive status [ER+]: Secondary | ICD-10-CM

## 2018-10-15 DIAGNOSIS — Z5189 Encounter for other specified aftercare: Secondary | ICD-10-CM | POA: Diagnosis not present

## 2018-10-15 DIAGNOSIS — Z95828 Presence of other vascular implants and grafts: Secondary | ICD-10-CM

## 2018-10-15 LAB — CMP (CANCER CENTER ONLY)
ALT: 27 U/L (ref 0–44)
AST: 17 U/L (ref 15–41)
Albumin: 4.1 g/dL (ref 3.5–5.0)
Alkaline Phosphatase: 71 U/L (ref 38–126)
Anion gap: 9 (ref 5–15)
BUN: 16 mg/dL (ref 6–20)
CO2: 23 mmol/L (ref 22–32)
Calcium: 8.7 mg/dL — ABNORMAL LOW (ref 8.9–10.3)
Chloride: 107 mmol/L (ref 98–111)
Creatinine: 0.67 mg/dL (ref 0.44–1.00)
GFR, Est AFR Am: >60 mL/min (ref >60)
GFR, Estimated: >60 mL/min (ref >60)
Glucose, Bld: 84 mg/dL (ref 70–99)
Potassium: 3.4 mmol/L — ABNORMAL LOW (ref 3.5–5.1)
Sodium: 139 mmol/L (ref 135–145)
Total Bilirubin: 0.6 mg/dL (ref 0.3–1.2)
Total Protein: 7 g/dL (ref 6.5–8.1)

## 2018-10-15 LAB — CBC WITH DIFFERENTIAL (CANCER CENTER ONLY)
Abs Immature Granulocytes: 0.04 10*3/uL (ref 0.00–0.07)
Basophils Absolute: 0.1 10*3/uL (ref 0.0–0.1)
Basophils Relative: 1 %
Eosinophils Absolute: 0 10*3/uL (ref 0.0–0.5)
Eosinophils Relative: 0 %
HCT: 36.1 % (ref 36.0–46.0)
Hemoglobin: 12.2 g/dL (ref 12.0–15.0)
Immature Granulocytes: 0 %
Lymphocytes Relative: 23 %
Lymphs Abs: 2.5 10*3/uL (ref 0.7–4.0)
MCH: 30.7 pg (ref 26.0–34.0)
MCHC: 33.8 g/dL (ref 30.0–36.0)
MCV: 90.7 fL (ref 80.0–100.0)
Monocytes Absolute: 0.8 10*3/uL (ref 0.1–1.0)
Monocytes Relative: 7 %
Neutro Abs: 7.4 10*3/uL (ref 1.7–7.7)
Neutrophils Relative %: 69 %
Platelet Count: 342 10*3/uL (ref 150–400)
RBC: 3.98 MIL/uL (ref 3.87–5.11)
RDW: 12.9 % (ref 11.5–15.5)
WBC Count: 10.8 10*3/uL — ABNORMAL HIGH (ref 4.0–10.5)
nRBC: 0 % (ref 0.0–0.2)

## 2018-10-15 MED ORDER — TRASTUZUMAB CHEMO 150 MG IV SOLR
6.0000 mg/kg | Freq: Once | INTRAVENOUS | Status: AC
  Administered 2018-10-15: 378 mg via INTRAVENOUS
  Filled 2018-10-15: qty 18

## 2018-10-15 MED ORDER — SODIUM CHLORIDE 0.9 % IV SOLN
Freq: Once | INTRAVENOUS | Status: AC
  Administered 2018-10-15: 10:00:00 via INTRAVENOUS
  Filled 2018-10-15: qty 5

## 2018-10-15 MED ORDER — SODIUM CHLORIDE 0.9 % IV SOLN
Freq: Once | INTRAVENOUS | Status: AC
  Administered 2018-10-15: 10:00:00 via INTRAVENOUS
  Filled 2018-10-15: qty 250

## 2018-10-15 MED ORDER — DIPHENHYDRAMINE HCL 25 MG PO CAPS
50.0000 mg | ORAL_CAPSULE | Freq: Once | ORAL | Status: AC
  Administered 2018-10-15: 50 mg via ORAL

## 2018-10-15 MED ORDER — PALONOSETRON HCL INJECTION 0.25 MG/5ML
0.2500 mg | Freq: Once | INTRAVENOUS | Status: AC
  Administered 2018-10-15: 10:00:00 0.25 mg via INTRAVENOUS

## 2018-10-15 MED ORDER — PALONOSETRON HCL INJECTION 0.25 MG/5ML
INTRAVENOUS | Status: AC
  Filled 2018-10-15: qty 5

## 2018-10-15 MED ORDER — ACETAMINOPHEN 325 MG PO TABS
650.0000 mg | ORAL_TABLET | Freq: Once | ORAL | Status: AC
  Administered 2018-10-15: 650 mg via ORAL

## 2018-10-15 MED ORDER — ACETAMINOPHEN 325 MG PO TABS
ORAL_TABLET | ORAL | Status: AC
  Filled 2018-10-15: qty 2

## 2018-10-15 MED ORDER — DIPHENHYDRAMINE HCL 25 MG PO CAPS
ORAL_CAPSULE | ORAL | Status: AC
  Filled 2018-10-15: qty 2

## 2018-10-15 MED ORDER — HEPARIN SOD (PORK) LOCK FLUSH 100 UNIT/ML IV SOLN
500.0000 [IU] | Freq: Once | INTRAVENOUS | Status: AC | PRN
  Administered 2018-10-15: 500 [IU]
  Filled 2018-10-15: qty 5

## 2018-10-15 MED ORDER — SODIUM CHLORIDE 0.9% FLUSH
10.0000 mL | Freq: Once | INTRAVENOUS | Status: AC
  Administered 2018-10-15: 10 mL
  Filled 2018-10-15: qty 10

## 2018-10-15 MED ORDER — SODIUM CHLORIDE 0.9 % IV SOLN
75.0000 mg/m2 | Freq: Once | INTRAVENOUS | Status: AC
  Administered 2018-10-15: 130 mg via INTRAVENOUS
  Filled 2018-10-15: qty 13

## 2018-10-15 MED ORDER — SODIUM CHLORIDE 0.9% FLUSH
10.0000 mL | INTRAVENOUS | Status: DC | PRN
  Administered 2018-10-15: 10 mL
  Filled 2018-10-15: qty 10

## 2018-10-15 MED ORDER — SODIUM CHLORIDE 0.9 % IV SOLN
420.0000 mg | Freq: Once | INTRAVENOUS | Status: AC
  Administered 2018-10-15: 420 mg via INTRAVENOUS
  Filled 2018-10-15: qty 14

## 2018-10-15 MED ORDER — SODIUM CHLORIDE 0.9 % IV SOLN
700.0000 mg | Freq: Once | INTRAVENOUS | Status: AC
  Administered 2018-10-15: 700 mg via INTRAVENOUS
  Filled 2018-10-15: qty 70

## 2018-10-15 NOTE — Patient Instructions (Signed)
[place] Discharge Instructions for Patients Receiving Chemotherapy  Today you received the following chemotherapy agents: Herceptin, Perjeta, Taxotere, Carboplatin.  To help prevent nausea and vomiting after your treatment, we encourage you to take your nausea medication as prescribed. If you develop nausea and vomiting that is not controlled by your nausea medication, call the clinic.   BELOW ARE SYMPTOMS THAT SHOULD BE REPORTED IMMEDIATELY:  *FEVER GREATER THAN 100.5 F  *CHILLS WITH OR WITHOUT FEVER  NAUSEA AND VOMITING THAT IS NOT CONTROLLED WITH YOUR NAUSEA MEDICATION  *UNUSUAL SHORTNESS OF BREATH  *UNUSUAL BRUISING OR BLEEDING  TENDERNESS IN MOUTH AND THROAT WITH OR WITHOUT PRESENCE OF ULCERS  *URINARY PROBLEMS  *BOWEL PROBLEMS  UNUSUAL RASH Items with * indicate a potential emergency and should be followed up as soon as possible.  Feel free to call the clinic should you have any questions or concerns. The clinic phone number is [phone].  Please show the [place] at check-in to the Emergency Department and triage nurse.

## 2018-10-16 ENCOUNTER — Ambulatory Visit: Payer: BLUE CROSS/BLUE SHIELD

## 2018-10-17 ENCOUNTER — Other Ambulatory Visit: Payer: Self-pay

## 2018-10-17 ENCOUNTER — Inpatient Hospital Stay: Payer: BLUE CROSS/BLUE SHIELD

## 2018-10-17 VITALS — BP 140/86 | HR 79 | Temp 98.9°F | Resp 16

## 2018-10-17 DIAGNOSIS — C50411 Malignant neoplasm of upper-outer quadrant of right female breast: Secondary | ICD-10-CM

## 2018-10-17 DIAGNOSIS — Z170 Estrogen receptor positive status [ER+]: Secondary | ICD-10-CM

## 2018-10-17 MED ORDER — PEGFILGRASTIM-CBQV 6 MG/0.6ML ~~LOC~~ SOSY
6.0000 mg | PREFILLED_SYRINGE | Freq: Once | SUBCUTANEOUS | Status: AC
  Administered 2018-10-17: 6 mg via SUBCUTANEOUS

## 2018-10-17 MED ORDER — PEGFILGRASTIM-CBQV 6 MG/0.6ML ~~LOC~~ SOSY
PREFILLED_SYRINGE | SUBCUTANEOUS | Status: AC
  Filled 2018-10-17: qty 0.6

## 2018-10-17 NOTE — Patient Instructions (Signed)

## 2018-10-22 ENCOUNTER — Other Ambulatory Visit: Payer: Self-pay | Admitting: *Deleted

## 2018-10-22 ENCOUNTER — Telehealth: Payer: Self-pay | Admitting: *Deleted

## 2018-10-22 ENCOUNTER — Other Ambulatory Visit: Payer: BLUE CROSS/BLUE SHIELD

## 2018-10-22 ENCOUNTER — Other Ambulatory Visit: Payer: Self-pay

## 2018-10-22 DIAGNOSIS — C50411 Malignant neoplasm of upper-outer quadrant of right female breast: Secondary | ICD-10-CM

## 2018-10-22 DIAGNOSIS — Z20822 Contact with and (suspected) exposure to covid-19: Secondary | ICD-10-CM

## 2018-10-22 DIAGNOSIS — Z170 Estrogen receptor positive status [ER+]: Secondary | ICD-10-CM

## 2018-10-22 NOTE — Telephone Encounter (Signed)
Received call from patient stating she was exposed at her job to patients her were COVID positive. She denies any symptoms at this time.  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  She will come today to the [facility] to be tested.  She is scheduled for cycle 3 TCHP 7/29.

## 2018-10-22 NOTE — Progress Notes (Unsigned)
cov

## 2018-10-23 ENCOUNTER — Other Ambulatory Visit: Payer: BLUE CROSS/BLUE SHIELD

## 2018-10-26 LAB — NOVEL CORONAVIRUS, NAA: SARS-CoV-2, NAA: NOT DETECTED

## 2018-10-31 ENCOUNTER — Ambulatory Visit: Payer: BLUE CROSS/BLUE SHIELD | Admitting: Hematology and Oncology

## 2018-10-31 ENCOUNTER — Other Ambulatory Visit: Payer: BLUE CROSS/BLUE SHIELD

## 2018-10-31 ENCOUNTER — Ambulatory Visit: Payer: BLUE CROSS/BLUE SHIELD

## 2018-10-31 NOTE — Assessment & Plan Note (Addendum)
08/20/2018:Right lumpectomy: Grade 2 IDC with DCIS, 2.2 cm, margins negative, 1/1 lymph node positive with focal extranodal extension, ER 50%, PR 50%, HER-2 positive, 3+, Ki-67 10 to 15%, T2N1  Pathology counseling: I discussed the final pathology report of the patient provided a copy of this report. I discussed the margins as well as lymph node surgeries. We also discussed the final staging along with previously performed ER/PR and HER-2/neu testing.  Recommendation: 1.Adjuvant chemotherapy with [place] followed by Herceptin Perjeta maintenance 2.Adjuvant radiation 3.Adjuvant antiestrogen therapy with tamoxifen x10 years 4.Adjuvant neratinib ---------------------------------------------------------------------------------------------------------------------------------------- Current treatment: Cycle 3 day [place] Perjeta Echocardiogram 07/17/2018: EF 60 to 65%  Toxicities: 1.Fatigue 2.Mild diarrhea: Well responding to Imodium. 3.Esophagitis: I sent a prescription for Magic mouthwash.:  Much improved 4.  Eyelid fluttering: Probably due to mild hypokalemia.  Encouraged her to eat potassium containing foods.  10/22/2018: COVID negative Return to clinicin 3 weeks for cycle 4

## 2018-11-01 ENCOUNTER — Ambulatory Visit: Payer: BLUE CROSS/BLUE SHIELD

## 2018-11-04 NOTE — Progress Notes (Signed)
Patient Care Team: [name], MD as PCP - General (Internal Medicine) Mauro Kaufmann, RN as Oncology Nurse Navigator [name], RN as Oncology Nurse Navigator  DIAGNOSIS:    ICD-10-CM   1. Malignant neoplasm of upper-outer quadrant of right breast in female, estrogen receptor positive ([place])  C50.411    Z17.0     SUMMARY OF ONCOLOGIC HISTORY: Oncology History  Malignant neoplasm of upper-outer quadrant of right breast in female, estrogen receptor positive ([place])  06/26/2018 Initial Diagnosis   [place]: Screening mammogram detected focal asymmetry in the right breast, ultrasound revealed 2 cm oval mass: Biopsy revealed grade 1-2 invasive ductal carcinoma, ER 3+, PR 3+, HER-2 3+ positive T1c M0 stage Ia clinical stage   07/23/2018 Cancer Staging   Staging form: Breast, AJCC 8th Edition - Clinical stage from 07/23/2018: Stage IA (cT1c, cN0, cM0, G2, ER+, PR+, HER2+) - Signed by Nicholas Lose, MD on 07/23/2018   08/20/2018 Surgery   Right lumpectomy: Grade 2 IDC with DCIS, 2.2 cm, margins negative, 1/1 lymph node positive with focal extranodal extension, ER 50%, PR 50%, HER-2 positive, 3+, Ki-67 10 to 15%, T2N1   09/03/2018 Cancer Staging   Staging form: Breast, AJCC 8th Edition - Pathologic: Stage IB (pT2, pN1a, cM0, G2, ER+, PR+, HER2+) - Signed by Gardenia Phlegm, NP on 09/03/2018   09/23/2018 -  Chemotherapy   Adjuvant chemo with [place] Perjeta     CHIEF COMPLIANT: Cycle 3 TCHP  INTERVAL HISTORY: Megan Acevedo is a 49 y.o. with above-mentioned history of HER2 positive right breast cancer who underwent a lumpectomyand is currently on adjuvant chemotherapy withTCHP. COVID-19 testing after a workplace exposure on 10/22/2018 was negative. She presents to the clinic today for cycle 3.  Apart from mild mild diarrhea and lack of taste that last for 1 to 2 days she has done extremely well.  She has been working full-time.  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  We will need to reach out to them to discuss this issue.  I do not think it is safe for her to be working in contact with patients were suspected or could potentially have COVID positivity.  REVIEW OF SYSTEMS:   Constitutional: Denies fevers, chills or abnormal weight loss Eyes: Denies blurriness of vision Ears, nose, mouth, throat, and face: Denies mucositis or sore throat Respiratory: Denies cough, dyspnea or wheezes Cardiovascular: Denies palpitation, chest discomfort Gastrointestinal: Diarrhea that is short lasting and taste changes Skin: Denies abnormal skin rashes Lymphatics: Denies new lymphadenopathy or easy bruising Neurological: Denies numbness, tingling or new weaknesses Behavioral/Psych: Mood is stable, no new changes  Extremities: No lower extremity edema Breast: denies any pain or lumps or nodules in either breasts All other systems were reviewed with the patient and are negative.  I have reviewed the past medical history, past surgical history, social history and family history with the patient and they are unchanged from previous note.  ALLERGIES:  has No Known Allergies.  MEDICATIONS:  Current Outpatient Medications  Medication Sig Dispense Refill  . acetaminophen (TYLENOL) 325 MG tablet Take 650 mg by mouth every 6 (six) hours as needed.    . benzoyl peroxide-erythromycin (BENZAMYCIN) gel Apply topically 2 (two) times daily. 23.3 g 0  . dexamethasone (DECADRON) 4 MG tablet Take 1 tablet (4 mg total) by mouth daily. Take 1 tablet day before chemo and 1 tablet day after chemo with food 12 tablet 0  .  lidocaine-prilocaine (EMLA) cream Apply to affected area once 30 g 3  . LORazepam (ATIVAN) 0.5 MG tablet Take 1 tablet (0.5 mg total) by mouth at bedtime as needed (Nausea or vomiting). 30 tablet 0  . magic mouthwash w/lidocaine SOLN Take 5 mLs by mouth 3 (three) times daily as needed for mouth pain. 100 mL 1  . Multiple  Vitamins-Minerals (CENTRUM SILVER 50+WOMEN) TABS Take 1 each by mouth every morning.    Marland Kitchen omeprazole (PRILOSEC) 20 MG capsule Take 20 mg by mouth daily.    . ondansetron (ZOFRAN) 8 MG tablet Take 1 tablet (8 mg total) by mouth 2 (two) times daily as needed (Nausea or vomiting). Begin 4 days after chemotherapy. 30 tablet 1  . oxybutynin (DITROPAN) 5 MG tablet Take 5 mg by mouth 3 (three) times daily.    Marland Kitchen oxyCODONE (OXY IR/ROXICODONE) 5 MG immediate release tablet Take 1 tablet (5 mg total) by mouth every 6 (six) hours as needed for moderate pain, severe pain or breakthrough pain. 12 tablet 0  . prochlorperazine (COMPAZINE) 10 MG tablet Take 1 tablet (10 mg total) by mouth every 6 (six) hours as needed (Nausea or vomiting). 30 tablet 1   No current facility-administered medications for this visit.     PHYSICAL EXAMINATION: ECOG PERFORMANCE STATUS: 1 - Symptomatic but completely ambulatory  Vitals:   11/05/18 0817  BP: 131/74  Pulse: 80  Resp: 17  Temp: 97.7 F (36.5 C)  SpO2: 100%   Filed Weights   11/05/18 0817  Weight: 135 lb 12.8 oz (61.6 kg)    GENERAL: alert, no distress and comfortable SKIN: skin color, texture, turgor are normal, no rashes or significant lesions EYES: normal, Conjunctiva are pink and non-injected, sclera clear OROPHARYNX: no exudate, no erythema and lips, buccal mucosa, and tongue normal  NECK: supple, thyroid normal size, non-tender, without nodularity LYMPH: no palpable lymphadenopathy in the cervical, axillary or inguinal LUNGS: clear to auscultation and percussion with normal breathing effort HEART: regular rate & rhythm and no murmurs and no lower extremity edema ABDOMEN: abdomen soft, non-tender and normal bowel sounds MUSCULOSKELETAL: no cyanosis of digits and no clubbing  NEURO: alert & oriented x 3 with fluent speech, no focal motor/sensory deficits EXTREMITIES: No lower extremity edema  LABORATORY DATA:  I have reviewed the data as listed CMP  Latest Ref Rng & Units 10/15/2018 09/30/2018 09/19/2018  Glucose 70 - 99 mg/dL 84 100(H) 110(H)  BUN 6 - 20 mg/dL _0 Creatinine 0.44 - 1.00 mg/dL 0.67 0.82 0.74  Sodium 135 - 145 mmol/L 139 137 139  Potassium 3.5 - 5.1 mmol/L 3.4(L) 4.1 3.6  Chloride 98 - 111 mmol/L 107 104 106  CO2 22 - 32 mmol/L _1 Calcium 8.9 - 10.3 mg/dL 8.7(L) 8.9 8.8(L)  Total Protein 6.5 - 8.1 g/dL 7.0 7.4 7.6  Total Bilirubin 0.3 - 1.2 mg/dL 0.6 0.5 0.7  Alkaline Phos 38 - 126 U/L 71 98 79  AST 15 - 41 U/L _2 ALT 0 - 44 U/L 27 33 19    Lab Results  Component Value Date   WBC 11.1 (H) 11/05/2018   HGB 12.3 11/05/2018   HCT 37.0 11/05/2018   MCV 94.4 11/05/2018   PLT 268 11/05/2018   NEUTROABS 7.8 (H) 11/05/2018    ASSESSMENT & PLAN:  Malignant neoplasm of upper-outer quadrant of right breast in female, estrogen receptor positive ([facility]) 08/20/2018:Right lumpectomy: Grade 2 IDC with DCIS, 2.2 cm, margins  negative, 1/1 lymph node positive with focal extranodal extension, ER 50%, PR 50%, HER-2 positive, 3+, Ki-67 10 to 15%, T2N1  Pathology counseling: I discussed the final pathology report of the patient provided a copy of this report. I discussed the margins as well as lymph node surgeries. We also discussed the final staging along with previously performed ER/PR and HER-2/neu testing.  Recommendation: 1.Adjuvant chemotherapy with [place] followed by Herceptin Perjeta maintenance 2.Adjuvant radiation 3.Adjuvant antiestrogen therapy with tamoxifen x10 years 4.Adjuvant neratinib ---------------------------------------------------------------------------------------------------------------------------------------- Current treatment: Cycle 3 day [place] Perjeta Echocardiogram 07/17/2018: EF 60 to 65%  Toxicities: 1.Fatigue 2.Mild diarrhea: Well responding to Imodium. 3.Esophagitis: I sent a prescription for Magic mouthwash.:  Much improved 4.  Eyelid fluttering: Probably  due to mild hypokalemia.  Encouraged her to eat potassium containing foods.  10/22/2018: COVID negative Return to clinicin 3 weeks for cycle 4    No orders of the defined types were placed in this encounter.  The patient has a good understanding of the overall plan. she agrees with it. she will call with any problems that may develop before the next visit here.  Nicholas Lose, MD 11/05/2018  Julious Oka Dorshimer am acting as scribe for Dr. Nicholas Lose.  I have reviewed the above documentation for accuracy and completeness, and I agree with the above.

## 2018-11-05 ENCOUNTER — Other Ambulatory Visit: Payer: Self-pay

## 2018-11-05 ENCOUNTER — Inpatient Hospital Stay (HOSPITAL_BASED_OUTPATIENT_CLINIC_OR_DEPARTMENT_OTHER): Payer: BLUE CROSS/BLUE SHIELD | Admitting: Hematology and Oncology

## 2018-11-05 ENCOUNTER — Inpatient Hospital Stay: Payer: BLUE CROSS/BLUE SHIELD

## 2018-11-05 DIAGNOSIS — Z170 Estrogen receptor positive status [ER+]: Secondary | ICD-10-CM

## 2018-11-05 DIAGNOSIS — Z95828 Presence of other vascular implants and grafts: Secondary | ICD-10-CM

## 2018-11-05 DIAGNOSIS — C50411 Malignant neoplasm of upper-outer quadrant of right female breast: Secondary | ICD-10-CM

## 2018-11-05 DIAGNOSIS — Z79899 Other long term (current) drug therapy: Secondary | ICD-10-CM | POA: Diagnosis not present

## 2018-11-05 LAB — CBC WITH DIFFERENTIAL (CANCER CENTER ONLY)
Abs Immature Granulocytes: 0.04 10*3/uL (ref 0.00–0.07)
Basophils Absolute: 0 10*3/uL (ref 0.0–0.1)
Basophils Relative: 0 %
Eosinophils Absolute: 0 10*3/uL (ref 0.0–0.5)
Eosinophils Relative: 0 %
HCT: 37 % (ref 36.0–46.0)
Hemoglobin: 12.3 g/dL (ref 12.0–15.0)
Immature Granulocytes: 0 %
Lymphocytes Relative: 22 %
Lymphs Abs: 2.4 10*3/uL (ref 0.7–4.0)
MCH: 31.4 pg (ref 26.0–34.0)
MCHC: 33.2 g/dL (ref 30.0–36.0)
MCV: 94.4 fL (ref 80.0–100.0)
Monocytes Absolute: 0.8 10*3/uL (ref 0.1–1.0)
Monocytes Relative: 7 %
Neutro Abs: 7.8 10*3/uL — ABNORMAL HIGH (ref 1.7–7.7)
Neutrophils Relative %: 71 %
Platelet Count: 268 10*3/uL (ref 150–400)
RBC: 3.92 MIL/uL (ref 3.87–5.11)
RDW: 13.8 % (ref 11.5–15.5)
WBC Count: 11.1 10*3/uL — ABNORMAL HIGH (ref 4.0–10.5)
nRBC: 0 % (ref 0.0–0.2)

## 2018-11-05 LAB — CMP (CANCER CENTER ONLY)
ALT: 33 U/L (ref 0–44)
AST: 19 U/L (ref 15–41)
Albumin: 3.9 g/dL (ref 3.5–5.0)
Alkaline Phosphatase: 73 U/L (ref 38–126)
Anion gap: 10 (ref 5–15)
BUN: 14 mg/dL (ref 6–20)
CO2: 24 mmol/L (ref 22–32)
Calcium: 9.2 mg/dL (ref 8.9–10.3)
Chloride: 106 mmol/L (ref 98–111)
Creatinine: 0.68 mg/dL (ref 0.44–1.00)
GFR, Est AFR Am: >60 mL/min (ref >60)
GFR, Estimated: >60 mL/min (ref >60)
Glucose, Bld: 84 mg/dL (ref 70–99)
Potassium: 3.6 mmol/L (ref 3.5–5.1)
Sodium: 140 mmol/L (ref 135–145)
Total Bilirubin: 0.8 mg/dL (ref 0.3–1.2)
Total Protein: 7 g/dL (ref 6.5–8.1)

## 2018-11-05 MED ORDER — SODIUM CHLORIDE 0.9 % IV SOLN
Freq: Once | INTRAVENOUS | Status: AC
  Administered 2018-11-05: 09:00:00 via INTRAVENOUS
  Filled 2018-11-05: qty 250

## 2018-11-05 MED ORDER — HEPARIN SOD (PORK) LOCK FLUSH 100 UNIT/ML IV SOLN
500.0000 [IU] | Freq: Once | INTRAVENOUS | Status: AC | PRN
  Administered 2018-11-05: 14:00:00 500 [IU]
  Filled 2018-11-05: qty 5

## 2018-11-05 MED ORDER — DIPHENHYDRAMINE HCL 25 MG PO CAPS
50.0000 mg | ORAL_CAPSULE | Freq: Once | ORAL | Status: AC
  Administered 2018-11-05: 50 mg via ORAL

## 2018-11-05 MED ORDER — ACETAMINOPHEN 325 MG PO TABS
650.0000 mg | ORAL_TABLET | Freq: Once | ORAL | Status: AC
  Administered 2018-11-05: 650 mg via ORAL

## 2018-11-05 MED ORDER — SODIUM CHLORIDE 0.9% FLUSH
10.0000 mL | INTRAVENOUS | Status: DC | PRN
  Administered 2018-11-05: 10 mL
  Filled 2018-11-05: qty 10

## 2018-11-05 MED ORDER — PALONOSETRON HCL INJECTION 0.25 MG/5ML
INTRAVENOUS | Status: AC
  Filled 2018-11-05: qty 5

## 2018-11-05 MED ORDER — SODIUM CHLORIDE 0.9 % IV SOLN
420.0000 mg | Freq: Once | INTRAVENOUS | Status: AC
  Administered 2018-11-05: 420 mg via INTRAVENOUS
  Filled 2018-11-05: qty 14

## 2018-11-05 MED ORDER — SODIUM CHLORIDE 0.9 % IV SOLN
700.0000 mg | Freq: Once | INTRAVENOUS | Status: AC
  Administered 2018-11-05: 700 mg via INTRAVENOUS
  Filled 2018-11-05: qty 70

## 2018-11-05 MED ORDER — TRASTUZUMAB CHEMO 150 MG IV SOLR
6.0000 mg/kg | Freq: Once | INTRAVENOUS | Status: AC
  Administered 2018-11-05: 378 mg via INTRAVENOUS
  Filled 2018-11-05: qty 18

## 2018-11-05 MED ORDER — ACETAMINOPHEN 325 MG PO TABS
ORAL_TABLET | ORAL | Status: AC
  Filled 2018-11-05: qty 2

## 2018-11-05 MED ORDER — PALONOSETRON HCL INJECTION 0.25 MG/5ML
0.2500 mg | Freq: Once | INTRAVENOUS | Status: AC
  Administered 2018-11-05: 0.25 mg via INTRAVENOUS

## 2018-11-05 MED ORDER — SODIUM CHLORIDE 0.9 % IV SOLN
Freq: Once | INTRAVENOUS | Status: AC
  Administered 2018-11-05: 09:00:00 via INTRAVENOUS
  Filled 2018-11-05: qty 5

## 2018-11-05 MED ORDER — SODIUM CHLORIDE 0.9% FLUSH
10.0000 mL | Freq: Once | INTRAVENOUS | Status: AC
  Administered 2018-11-05: 10 mL
  Filled 2018-11-05: qty 10

## 2018-11-05 MED ORDER — DIPHENHYDRAMINE HCL 25 MG PO CAPS
ORAL_CAPSULE | ORAL | Status: AC
  Filled 2018-11-05: qty 2

## 2018-11-05 MED ORDER — SODIUM CHLORIDE 0.9 % IV SOLN
75.0000 mg/m2 | Freq: Once | INTRAVENOUS | Status: AC
  Administered 2018-11-05: 130 mg via INTRAVENOUS
  Filled 2018-11-05: qty 13

## 2018-11-05 NOTE — Patient Instructions (Signed)
[place] Discharge Instructions for Patients Receiving Chemotherapy  Today you received the following chemotherapy agents: Herceptin, Perjeta, Taxotere, Carboplatin.  To help prevent nausea and vomiting after your treatment, we encourage you to take your nausea medication as prescribed. If you develop nausea and vomiting that is not controlled by your nausea medication, call the clinic.   BELOW ARE SYMPTOMS THAT SHOULD BE REPORTED IMMEDIATELY:  *FEVER GREATER THAN 100.5 F  *CHILLS WITH OR WITHOUT FEVER  NAUSEA AND VOMITING THAT IS NOT CONTROLLED WITH YOUR NAUSEA MEDICATION  *UNUSUAL SHORTNESS OF BREATH  *UNUSUAL BRUISING OR BLEEDING  TENDERNESS IN MOUTH AND THROAT WITH OR WITHOUT PRESENCE OF ULCERS  *URINARY PROBLEMS  *BOWEL PROBLEMS  UNUSUAL RASH Items with * indicate a potential emergency and should be followed up as soon as possible.  Feel free to call the clinic should you have any questions or concerns. The clinic phone number is [phone].  Please show the [place] at check-in to the Emergency Department and triage nurse.

## 2018-11-06 ENCOUNTER — Other Ambulatory Visit: Payer: Self-pay

## 2018-11-06 ENCOUNTER — Inpatient Hospital Stay: Payer: BLUE CROSS/BLUE SHIELD

## 2018-11-06 VITALS — BP 135/79 | HR 88 | Temp 98.7°F | Resp 18

## 2018-11-06 DIAGNOSIS — C50411 Malignant neoplasm of upper-outer quadrant of right female breast: Secondary | ICD-10-CM

## 2018-11-06 DIAGNOSIS — Z170 Estrogen receptor positive status [ER+]: Secondary | ICD-10-CM

## 2018-11-06 MED ORDER — PEGFILGRASTIM-CBQV 6 MG/0.6ML ~~LOC~~ SOSY
6.0000 mg | PREFILLED_SYRINGE | Freq: Once | SUBCUTANEOUS | Status: AC
  Administered 2018-11-06: 15:00:00 6 mg via SUBCUTANEOUS

## 2018-11-06 MED ORDER — PEGFILGRASTIM-CBQV 6 MG/0.6ML ~~LOC~~ SOSY
PREFILLED_SYRINGE | SUBCUTANEOUS | Status: AC
  Filled 2018-11-06: qty 0.6

## 2018-11-09 NOTE — Progress Notes (Signed)
The following biosimilar [place] ([id number]) has been selected for use in this patient.  Kennith Center, Pharm.D., CPP 11/09/2018@10 :13 AM

## 2018-11-14 ENCOUNTER — Other Ambulatory Visit: Payer: Self-pay

## 2018-11-14 ENCOUNTER — Ambulatory Visit (HOSPITAL_COMMUNITY)
Admission: RE | Admit: 2018-11-14 | Discharge: 2018-11-14 | Disposition: A | Discharge status: Home / Self Care | Payer: BLUE CROSS/BLUE SHIELD | Source: Ambulatory Visit | Attending: Hematology and Oncology | Admitting: Hematology and Oncology

## 2018-11-14 DIAGNOSIS — Z170 Estrogen receptor positive status [ER+]: Secondary | ICD-10-CM | POA: Insufficient documentation

## 2018-11-14 DIAGNOSIS — C50411 Malignant neoplasm of upper-outer quadrant of right female breast: Secondary | ICD-10-CM

## 2018-11-14 NOTE — Progress Notes (Signed)
  Echocardiogram 2D Echocardiogram has been performed.  Megan Acevedo 11/14/2018, 11:33 AM

## 2018-11-19 NOTE — Assessment & Plan Note (Signed)
08/20/2018:Right lumpectomy: Grade 2 IDC with DCIS, 2.2 cm, margins negative, 1/1 lymph node positive with focal extranodal extension, ER 50%, PR 50%, HER-2 positive, 3+, Ki-67 10 to 15%, T2N1  Pathology counseling: I discussed the final pathology report of the patient provided a copy of this report. I discussed the margins as well as lymph node surgeries. We also discussed the final staging along with previously performed ER/PR and HER-2/neu testing.  Recommendation: 1.Adjuvant chemotherapy with [place] followed by Herceptin Perjeta maintenance 2.Adjuvant radiation 3.Adjuvant antiestrogen therapy with tamoxifen x10 years 4.Adjuvant neratinib ---------------------------------------------------------------------------------------------------------------------------------------- Current treatment: Cycle4day [place] Perjeta Echocardiogram 07/17/2018: EF 60 to 65%  Toxicities: 1.Fatigue 2.Mild diarrhea: Well responding to Imodium. 3.Esophagitis: I sent a prescription for Magic mouthwash.: Much improved 4.Eyelid fluttering: Probably due to mild hypokalemia. Encouraged her to eat potassium containing foods.  10/22/2018: COVID negative Return to clinicin3 weeks for cycle 5

## 2018-11-25 NOTE — Progress Notes (Signed)
Patient Care Team: [name], MD as PCP - General (Internal Medicine) Mauro Kaufmann, RN as Oncology Nurse Navigator [name], RN as Oncology Nurse Navigator  DIAGNOSIS:    ICD-10-CM   1. Malignant neoplasm of upper-outer quadrant of right breast in female, estrogen receptor positive ([place])  C50.411    Z17.0     SUMMARY OF ONCOLOGIC HISTORY: Oncology History  Malignant neoplasm of upper-outer quadrant of right breast in female, estrogen receptor positive ([place])  06/26/2018 Initial Diagnosis   [place]: Screening mammogram detected focal asymmetry in the right breast, ultrasound revealed 2 cm oval mass: Biopsy revealed grade 1-2 invasive ductal carcinoma, ER 3+, PR 3+, HER-2 3+ positive T1c M0 stage Ia clinical stage   07/23/2018 Cancer Staging   Staging form: Breast, AJCC 8th Edition - Clinical stage from 07/23/2018: Stage IA (cT1c, cN0, cM0, G2, ER+, PR+, HER2+) - Signed by Nicholas Lose, MD on 07/23/2018   08/20/2018 Surgery   Right lumpectomy: Grade 2 IDC with DCIS, 2.2 cm, margins negative, 1/1 lymph node positive with focal extranodal extension, ER 50%, PR 50%, HER-2 positive, 3+, Ki-67 10 to 15%, T2N1   09/03/2018 Cancer Staging   Staging form: Breast, AJCC 8th Edition - Pathologic: Stage IB (pT2, pN1a, cM0, G2, ER+, PR+, HER2+) - Signed by Gardenia Phlegm, NP on 09/03/2018   09/23/2018 -  Chemotherapy   Adjuvant chemo with [place] Perjeta     CHIEF COMPLIANT: Cycle 4 TCHP  INTERVAL HISTORY: Megan Acevedo is a 48 y.o. with above-mentioned history of HER2 positive right breast cancer who underwent a lumpectomyand is currently on adjuvant chemotherapy withTCHP. Echo on 11/14/18 showed an ejection fraction >65%. She presents to the clinic today for cycle 4.  She complains of profound fatigue that lasted a whole week.  She had constipation which required Dulcolax.  She had mild diarrhea afterwards.  Somewhat decreased taste and appetite.  Denies any  vomiting but has occasional nausea.  REVIEW OF SYSTEMS:   Constitutional: Denies fevers, chills or abnormal weight loss Eyes: Denies blurriness of vision Ears, nose, mouth, throat, and face: Denies mucositis or sore throat Respiratory: Denies cough, dyspnea or wheezes Cardiovascular: Denies palpitation, chest discomfort Gastrointestinal: Occasional nausea constipation alternating with diarrhea Skin: Denies abnormal skin rashes Lymphatics: Denies new lymphadenopathy or easy bruising Neurological: Denies numbness, tingling or new weaknesses Behavioral/Psych: Mood is stable, no new changes  Extremities: No lower extremity edema Breast: denies any pain or lumps or nodules in either breasts All other systems were reviewed with the patient and are negative.  I have reviewed the past medical history, past surgical history, social history and family history with the patient and they are unchanged from previous note.  ALLERGIES:  has No Known Allergies.  MEDICATIONS:  Current Outpatient Medications  Medication Sig Dispense Refill  . acetaminophen (TYLENOL) 325 MG tablet Take 650 mg by mouth every 6 (six) hours as needed.    . benzoyl peroxide-erythromycin (BENZAMYCIN) gel Apply topically 2 (two) times daily. 23.3 g 0  . dexamethasone (DECADRON) 4 MG tablet Take 1 tablet (4 mg total) by mouth daily. Take 1 tablet day before chemo and 1 tablet day after chemo with food 12 tablet 0  . lidocaine-prilocaine (EMLA) cream Apply to affected area once 30 g 3  . LORazepam (ATIVAN) 0.5 MG tablet Take 1 tablet (0.5 mg total) by mouth at bedtime as needed (Nausea or vomiting). 30 tablet 0  . magic mouthwash w/lidocaine SOLN Take 5 mLs  by mouth 3 (three) times daily as needed for mouth pain. 100 mL 1  . Multiple Vitamins-Minerals (CENTRUM SILVER 50+WOMEN) TABS Take 1 each by mouth every morning.    Marland Kitchen omeprazole (PRILOSEC) 20 MG capsule Take 20 mg by mouth daily.    . ondansetron (ZOFRAN) 8 MG tablet Take 1  tablet (8 mg total) by mouth 2 (two) times daily as needed (Nausea or vomiting). Begin 4 days after chemotherapy. 30 tablet 1  . oxybutynin (DITROPAN) 5 MG tablet Take 5 mg by mouth 3 (three) times daily.    Marland Kitchen oxyCODONE (OXY IR/ROXICODONE) 5 MG immediate release tablet Take 1 tablet (5 mg total) by mouth every 6 (six) hours as needed for moderate pain, severe pain or breakthrough pain. 12 tablet 0  . prochlorperazine (COMPAZINE) 10 MG tablet Take 1 tablet (10 mg total) by mouth every 6 (six) hours as needed (Nausea or vomiting). 30 tablet 1   No current facility-administered medications for this visit.     PHYSICAL EXAMINATION: ECOG PERFORMANCE STATUS: 1 - Symptomatic but completely ambulatory  Vitals:   11/26/18 0829  BP: (!) 145/73  Pulse: 82  Temp: 98.7 F (37.1 C)  SpO2: 100%   Filed Weights   11/26/18 0829  Weight: 133 lb 9.6 oz (60.6 kg)    GENERAL: alert, no distress and comfortable SKIN: skin color, texture, turgor are normal, no rashes or significant lesions EYES: normal, Conjunctiva are pink and non-injected, sclera clear OROPHARYNX: no exudate, no erythema and lips, buccal mucosa, and tongue normal  NECK: supple, thyroid normal size, non-tender, without nodularity LYMPH: no palpable lymphadenopathy in the cervical, axillary or inguinal LUNGS: clear to auscultation and percussion with normal breathing effort HEART: regular rate & rhythm and no murmurs and no lower extremity edema ABDOMEN: abdomen soft, non-tender and normal bowel sounds MUSCULOSKELETAL: no cyanosis of digits and no clubbing  NEURO: alert & oriented x 3 with fluent speech, no focal motor/sensory deficits EXTREMITIES: No lower extremity edema  LABORATORY DATA:  I have reviewed the data as listed CMP Latest Ref Rng & Units 11/05/2018 10/15/2018 09/30/2018  Glucose 70 - 99 mg/dL 84 84 100(H)  BUN 6 - 20 mg/dL '14 16 8  ' Creatinine 0.44 - 1.00 mg/dL 0.68 0.67 0.82  Sodium 135 - 145 mmol/L 140 139 137   Potassium 3.5 - 5.1 mmol/L 3.6 3.4(L) 4.1  Chloride 98 - 111 mmol/L 106 107 104  CO2 22 - 32 mmol/L '24 23 25  ' Calcium 8.9 - 10.3 mg/dL 9.2 8.7(L) 8.9  Total Protein 6.5 - 8.1 g/dL 7.0 7.0 7.4  Total Bilirubin 0.3 - 1.2 mg/dL 0.8 0.6 0.5  Alkaline Phos 38 - 126 U/L 73 71 98  AST 15 - 41 U/L '19 17 24  ' ALT 0 - 44 U/L 33 27 33    Lab Results  Component Value Date   WBC 11.8 (H) 11/26/2018   HGB 11.6 (L) 11/26/2018   HCT 35.8 (L) 11/26/2018   MCV 97.0 11/26/2018   PLT 248 11/26/2018   NEUTROABS 8.7 (H) 11/26/2018    ASSESSMENT & PLAN:  Malignant neoplasm of upper-outer quadrant of right breast in female, estrogen receptor positive (HCC) 08/20/2018:Right lumpectomy: Grade 2 IDC with DCIS, 2.2 cm, margins negative, 1/1 lymph node positive with focal extranodal extension, ER 50%, PR 50%, HER-2 positive, 3+, Ki-67 10 to 15%, T2N1  Pathology counseling: I discussed the final pathology report of the patient provided a copy of this report. I discussed the margins as well  as lymph node surgeries. We also discussed the final staging along with previously performed ER/PR and HER-2/neu testing.  Recommendation: 1.Adjuvant chemotherapy with [place] followed by Herceptin Perjeta maintenance 2.Adjuvant radiation 3.Adjuvant antiestrogen therapy with tamoxifen x10 years 4.Adjuvant neratinib ---------------------------------------------------------------------------------------------------------------------------------------- Current treatment: Cycle4day [place] Perjeta Echocardiogram 07/17/2018: EF 60 to 65%  Toxicities: 1.Fatigue: If her symptoms continue to persist we may consider reducing the dosage with cycle 5 of chemo. 2.Mild diarrhea: Well responding to Imodium. 3.Esophagitis: I sent a prescription for Magic mouthwash.: Much improved 4.Eyelid fluttering: Probably due to mild hypokalemia. Encouraged her to eat potassium containing foods.  10/22/2018: COVID negative  Return to clinicin3 weeks for cycle 5    No orders of the defined types were placed in this encounter.  The patient has a good understanding of the overall plan. she agrees with it. she will call with any problems that may develop before the next visit here.  Nicholas Lose, MD 11/26/2018  Julious Oka Dorshimer am acting as scribe for Dr. Nicholas Lose.  I have reviewed the above documentation for accuracy and completeness, and I agree with the above.

## 2018-11-26 ENCOUNTER — Inpatient Hospital Stay: Payer: BLUE CROSS/BLUE SHIELD

## 2018-11-26 ENCOUNTER — Inpatient Hospital Stay: Payer: BLUE CROSS/BLUE SHIELD | Attending: Hematology and Oncology

## 2018-11-26 ENCOUNTER — Other Ambulatory Visit: Payer: Self-pay

## 2018-11-26 ENCOUNTER — Inpatient Hospital Stay (HOSPITAL_BASED_OUTPATIENT_CLINIC_OR_DEPARTMENT_OTHER): Payer: BLUE CROSS/BLUE SHIELD | Admitting: Hematology and Oncology

## 2018-11-26 DIAGNOSIS — C50411 Malignant neoplasm of upper-outer quadrant of right female breast: Secondary | ICD-10-CM

## 2018-11-26 DIAGNOSIS — Z170 Estrogen receptor positive status [ER+]: Secondary | ICD-10-CM

## 2018-11-26 DIAGNOSIS — Z5189 Encounter for other specified aftercare: Secondary | ICD-10-CM | POA: Insufficient documentation

## 2018-11-26 DIAGNOSIS — Z5111 Encounter for antineoplastic chemotherapy: Secondary | ICD-10-CM | POA: Insufficient documentation

## 2018-11-26 DIAGNOSIS — Z5112 Encounter for antineoplastic immunotherapy: Secondary | ICD-10-CM | POA: Diagnosis present

## 2018-11-26 DIAGNOSIS — Z79899 Other long term (current) drug therapy: Secondary | ICD-10-CM | POA: Diagnosis not present

## 2018-11-26 DIAGNOSIS — Z95828 Presence of other vascular implants and grafts: Secondary | ICD-10-CM

## 2018-11-26 DIAGNOSIS — K209 Esophagitis, unspecified: Secondary | ICD-10-CM | POA: Insufficient documentation

## 2018-11-26 DIAGNOSIS — R197 Diarrhea, unspecified: Secondary | ICD-10-CM | POA: Insufficient documentation

## 2018-11-26 DIAGNOSIS — R5383 Other fatigue: Secondary | ICD-10-CM | POA: Insufficient documentation

## 2018-11-26 LAB — CBC WITH DIFFERENTIAL (CANCER CENTER ONLY)
Abs Immature Granulocytes: 0.03 10*3/uL (ref 0.00–0.07)
Basophils Absolute: 0 10*3/uL (ref 0.0–0.1)
Basophils Relative: 0 %
Eosinophils Absolute: 0 10*3/uL (ref 0.0–0.5)
Eosinophils Relative: 0 %
HCT: 35.8 % — ABNORMAL LOW (ref 36.0–46.0)
Hemoglobin: 11.6 g/dL — ABNORMAL LOW (ref 12.0–15.0)
Immature Granulocytes: 0 %
Lymphocytes Relative: 19 %
Lymphs Abs: 2.2 10*3/uL (ref 0.7–4.0)
MCH: 31.4 pg (ref 26.0–34.0)
MCHC: 32.4 g/dL (ref 30.0–36.0)
MCV: 97 fL (ref 80.0–100.0)
Monocytes Absolute: 0.9 10*3/uL (ref 0.1–1.0)
Monocytes Relative: 7 %
Neutro Abs: 8.7 10*3/uL — ABNORMAL HIGH (ref 1.7–7.7)
Neutrophils Relative %: 74 %
Platelet Count: 248 10*3/uL (ref 150–400)
RBC: 3.69 MIL/uL — ABNORMAL LOW (ref 3.87–5.11)
RDW: 14.6 % (ref 11.5–15.5)
WBC Count: 11.8 10*3/uL — ABNORMAL HIGH (ref 4.0–10.5)
nRBC: 0 % (ref 0.0–0.2)

## 2018-11-26 LAB — CMP (CANCER CENTER ONLY)
ALT: 27 U/L (ref 0–44)
AST: 23 U/L (ref 15–41)
Albumin: 4 g/dL (ref 3.5–5.0)
Alkaline Phosphatase: 72 U/L (ref 38–126)
Anion gap: 9 (ref 5–15)
BUN: 17 mg/dL (ref 6–20)
CO2: 24 mmol/L (ref 22–32)
Calcium: 8.9 mg/dL (ref 8.9–10.3)
Chloride: 107 mmol/L (ref 98–111)
Creatinine: 0.68 mg/dL (ref 0.44–1.00)
GFR, Est AFR Am: >60 mL/min (ref >60)
GFR, Estimated: >60 mL/min (ref >60)
Glucose, Bld: 81 mg/dL (ref 70–99)
Potassium: 3.6 mmol/L (ref 3.5–5.1)
Sodium: 140 mmol/L (ref 135–145)
Total Bilirubin: 0.5 mg/dL (ref 0.3–1.2)
Total Protein: 7 g/dL (ref 6.5–8.1)

## 2018-11-26 MED ORDER — SODIUM CHLORIDE 0.9 % IV SOLN
75.0000 mg/m2 | Freq: Once | INTRAVENOUS | Status: AC
  Administered 2018-11-26: 130 mg via INTRAVENOUS
  Filled 2018-11-26: qty 13

## 2018-11-26 MED ORDER — DIPHENHYDRAMINE HCL 25 MG PO CAPS
50.0000 mg | ORAL_CAPSULE | Freq: Once | ORAL | Status: AC
  Administered 2018-11-26: 50 mg via ORAL

## 2018-11-26 MED ORDER — SODIUM CHLORIDE 0.9 % IV SOLN
700.0000 mg | Freq: Once | INTRAVENOUS | Status: AC
  Administered 2018-11-26: 14:00:00 700 mg via INTRAVENOUS
  Filled 2018-11-26: qty 70

## 2018-11-26 MED ORDER — ACETAMINOPHEN 325 MG PO TABS
650.0000 mg | ORAL_TABLET | Freq: Once | ORAL | Status: AC
  Administered 2018-11-26: 650 mg via ORAL

## 2018-11-26 MED ORDER — SODIUM CHLORIDE 0.9% FLUSH
10.0000 mL | INTRAVENOUS | Status: DC | PRN
  Administered 2018-11-26: 10 mL
  Filled 2018-11-26: qty 10

## 2018-11-26 MED ORDER — SODIUM CHLORIDE 0.9 % IV SOLN
420.0000 mg | Freq: Once | INTRAVENOUS | Status: AC
  Administered 2018-11-26: 11:00:00 420 mg via INTRAVENOUS
  Filled 2018-11-26: qty 14

## 2018-11-26 MED ORDER — TRASTUZUMAB-DKST CHEMO 150 MG IV SOLR
6.0000 mg/kg | Freq: Once | INTRAVENOUS | Status: AC
  Administered 2018-11-26: 10:00:00 378 mg via INTRAVENOUS
  Filled 2018-11-26: qty 18

## 2018-11-26 MED ORDER — DIPHENHYDRAMINE HCL 25 MG PO CAPS
ORAL_CAPSULE | ORAL | Status: AC
  Filled 2018-11-26: qty 2

## 2018-11-26 MED ORDER — SODIUM CHLORIDE 0.9 % IV SOLN
Freq: Once | INTRAVENOUS | Status: AC
  Administered 2018-11-26: 09:00:00 via INTRAVENOUS
  Filled 2018-11-26: qty 250

## 2018-11-26 MED ORDER — SODIUM CHLORIDE 0.9% FLUSH
10.0000 mL | Freq: Once | INTRAVENOUS | Status: AC
  Administered 2018-11-26: 10 mL
  Filled 2018-11-26: qty 10

## 2018-11-26 MED ORDER — PALONOSETRON HCL INJECTION 0.25 MG/5ML
0.2500 mg | Freq: Once | INTRAVENOUS | Status: AC
  Administered 2018-11-26: 0.25 mg via INTRAVENOUS

## 2018-11-26 MED ORDER — HEPARIN SOD (PORK) LOCK FLUSH 100 UNIT/ML IV SOLN
500.0000 [IU] | Freq: Once | INTRAVENOUS | Status: AC | PRN
  Administered 2018-11-26: 500 [IU]
  Filled 2018-11-26: qty 5

## 2018-11-26 MED ORDER — PALONOSETRON HCL INJECTION 0.25 MG/5ML
INTRAVENOUS | Status: AC
  Filled 2018-11-26: qty 5

## 2018-11-26 MED ORDER — SODIUM CHLORIDE 0.9 % IV SOLN
Freq: Once | INTRAVENOUS | Status: AC
  Administered 2018-11-26: 10:00:00 via INTRAVENOUS
  Filled 2018-11-26: qty 5

## 2018-11-26 MED ORDER — ACETAMINOPHEN 325 MG PO TABS
ORAL_TABLET | ORAL | Status: AC
  Filled 2018-11-26: qty 2

## 2018-11-26 NOTE — Patient Instructions (Signed)
[place] Discharge Instructions for Patients Receiving Chemotherapy  Today you received the following chemotherapy agents Trastuzamab, perjeta, taxotere and carboplatin  To help prevent nausea and vomiting after your treatment, we encourage you to take your nausea medication as directed   If you develop nausea and vomiting that is not controlled by your nausea medication, call the clinic.   BELOW ARE SYMPTOMS THAT SHOULD BE REPORTED IMMEDIATELY:  *FEVER GREATER THAN 100.5 F  *CHILLS WITH OR WITHOUT FEVER  NAUSEA AND VOMITING THAT IS NOT CONTROLLED WITH YOUR NAUSEA MEDICATION  *UNUSUAL SHORTNESS OF BREATH  *UNUSUAL BRUISING OR BLEEDING  TENDERNESS IN MOUTH AND THROAT WITH OR WITHOUT PRESENCE OF ULCERS  *URINARY PROBLEMS  *BOWEL PROBLEMS  UNUSUAL RASH Items with * indicate a potential emergency and should be followed up as soon as possible.  Feel free to call the clinic should you have any questions or concerns. The clinic phone number is [phone].  Please show the [facility] at check-in to the Emergency Department and triage nurse.

## 2018-11-26 NOTE — Patient Instructions (Signed)

## 2018-11-28 ENCOUNTER — Other Ambulatory Visit: Payer: Self-pay

## 2018-11-28 ENCOUNTER — Inpatient Hospital Stay: Payer: BLUE CROSS/BLUE SHIELD

## 2018-11-28 VITALS — BP 150/86 | HR 90 | Temp 98.7°F | Resp 18

## 2018-11-28 DIAGNOSIS — Z170 Estrogen receptor positive status [ER+]: Secondary | ICD-10-CM

## 2018-11-28 DIAGNOSIS — C50411 Malignant neoplasm of upper-outer quadrant of right female breast: Secondary | ICD-10-CM

## 2018-11-28 MED ORDER — PEGFILGRASTIM-CBQV 6 MG/0.6ML ~~LOC~~ SOSY
6.0000 mg | PREFILLED_SYRINGE | Freq: Once | SUBCUTANEOUS | Status: AC
  Administered 2018-11-28: 6 mg via SUBCUTANEOUS

## 2018-11-28 MED ORDER — PEGFILGRASTIM-CBQV 6 MG/0.6ML ~~LOC~~ SOSY
PREFILLED_SYRINGE | SUBCUTANEOUS | Status: AC
  Filled 2018-11-28: qty 0.6

## 2018-11-28 NOTE — Patient Instructions (Signed)

## 2018-12-12 ENCOUNTER — Telehealth: Payer: Self-pay | Admitting: *Deleted

## 2018-12-12 NOTE — Telephone Encounter (Signed)
Received call from patient requesting to have her xrt in [place] as this is closer for her. Referral will be sent to [facility].

## 2018-12-16 ENCOUNTER — Telehealth: Payer: Self-pay | Admitting: *Deleted

## 2018-12-16 NOTE — Progress Notes (Signed)
Patient Care Team: [name], MD as PCP - General (Internal Medicine) Mauro Kaufmann, RN as Oncology Nurse Navigator [name], RN as Oncology Nurse Navigator  DIAGNOSIS:    ICD-10-CM   1. Malignant neoplasm of upper-outer quadrant of right breast in female, estrogen receptor positive ([place])  C50.411    Z17.0     SUMMARY OF ONCOLOGIC HISTORY: Oncology History  Malignant neoplasm of upper-outer quadrant of right breast in female, estrogen receptor positive ([place])  06/26/2018 Initial Diagnosis   [place]: Screening mammogram detected focal asymmetry in the right breast, ultrasound revealed 2 cm oval mass: Biopsy revealed grade 1-2 invasive ductal carcinoma, ER 3+, PR 3+, HER-2 3+ positive T1c M0 stage Ia clinical stage   07/23/2018 Cancer Staging   Staging form: Breast, AJCC 8th Edition - Clinical stage from 07/23/2018: Stage IA (cT1c, cN0, cM0, G2, ER+, PR+, HER2+) - Signed by Nicholas Lose, MD on 07/23/2018   08/20/2018 Surgery   Right lumpectomy: Grade 2 IDC with DCIS, 2.2 cm, margins negative, 1/1 lymph node positive with focal extranodal extension, ER 50%, PR 50%, HER-2 positive, 3+, Ki-67 10 to 15%, T2N1   09/03/2018 Cancer Staging   Staging form: Breast, AJCC 8th Edition - Pathologic: Stage IB (pT2, pN1a, cM0, G2, ER+, PR+, HER2+) - Signed by Gardenia Phlegm, NP on 09/03/2018   09/23/2018 -  Chemotherapy   Adjuvant chemo with [place] Perjeta     CHIEF COMPLIANT: Cycle 5 TCHP  INTERVAL HISTORY: Megan Acevedo is a 48 y.o. with above-mentioned history of HER2 positive right breast cancer who underwent a lumpectomyand is currently on adjuvant chemotherapy withTCHP. She presents to the clinic today for cycle 5.   REVIEW OF SYSTEMS:   Constitutional: Denies fevers, chills or abnormal weight loss Eyes: Denies blurriness of vision Ears, nose, mouth, throat, and face: Denies mucositis or sore throat Respiratory: Denies cough, dyspnea or wheezes  Cardiovascular: Denies palpitation, chest discomfort Gastrointestinal: Denies nausea, heartburn or change in bowel habits Skin: Denies abnormal skin rashes Lymphatics: Denies new lymphadenopathy or easy bruising Neurological: Denies numbness, tingling or new weaknesses Behavioral/Psych: Mood is stable, no new changes  Extremities: No lower extremity edema Breast: denies any pain or lumps or nodules in either breasts All other systems were reviewed with the patient and are negative.  I have reviewed the past medical history, past surgical history, social history and family history with the patient and they are unchanged from previous note.  ALLERGIES:  has No Known Allergies.  MEDICATIONS:  Current Outpatient Medications  Medication Sig Dispense Refill  . acetaminophen (TYLENOL) 325 MG tablet Take 650 mg by mouth every 6 (six) hours as needed.    . benzoyl peroxide-erythromycin (BENZAMYCIN) gel Apply topically 2 (two) times daily. 23.3 g 0  . dexamethasone (DECADRON) 4 MG tablet Take 1 tablet (4 mg total) by mouth daily. Take 1 tablet day before chemo and 1 tablet day after chemo with food 12 tablet 0  . lidocaine-prilocaine (EMLA) cream Apply to affected area once 30 g 3  . LORazepam (ATIVAN) 0.5 MG tablet Take 1 tablet (0.5 mg total) by mouth at bedtime as needed (Nausea or vomiting). 30 tablet 0  . magic mouthwash w/lidocaine SOLN Take 5 mLs by mouth 3 (three) times daily as needed for mouth pain. 100 mL 1  . Multiple Vitamins-Minerals (CENTRUM SILVER 50+WOMEN) TABS Take 1 each by mouth every morning.    Marland Kitchen omeprazole (PRILOSEC) 20 MG capsule Take 20 mg by mouth  daily.    . ondansetron (ZOFRAN) 8 MG tablet Take 1 tablet (8 mg total) by mouth 2 (two) times daily as needed (Nausea or vomiting). Begin 4 days after chemotherapy. 30 tablet 1  . oxybutynin (DITROPAN) 5 MG tablet Take 5 mg by mouth 3 (three) times daily.    Marland Kitchen oxyCODONE (OXY IR/ROXICODONE) 5 MG immediate release tablet Take 1  tablet (5 mg total) by mouth every 6 (six) hours as needed for moderate pain, severe pain or breakthrough pain. 12 tablet 0  . prochlorperazine (COMPAZINE) 10 MG tablet Take 1 tablet (10 mg total) by mouth every 6 (six) hours as needed (Nausea or vomiting). 30 tablet 1   No current facility-administered medications for this visit.     PHYSICAL EXAMINATION: ECOG PERFORMANCE STATUS: 1 - Symptomatic but completely ambulatory  Vitals:   12/17/18 0838  BP: 139/79  Pulse: 89  Resp: 17  Temp: 98.3 F (36.8 C)  SpO2: 100%   Filed Weights   12/17/18 0838  Weight: 139 lb 4.8 oz (63.2 kg)    GENERAL: alert, no distress and comfortable SKIN: skin color, texture, turgor are normal, no rashes or significant lesions EYES: normal, Conjunctiva are pink and non-injected, sclera clear OROPHARYNX: no exudate, no erythema and lips, buccal mucosa, and tongue normal  NECK: supple, thyroid normal size, non-tender, without nodularity LYMPH: no palpable lymphadenopathy in the cervical, axillary or inguinal LUNGS: clear to auscultation and percussion with normal breathing effort HEART: regular rate & rhythm and no murmurs and no lower extremity edema ABDOMEN: abdomen soft, non-tender and normal bowel sounds MUSCULOSKELETAL: no cyanosis of digits and no clubbing  NEURO: alert & oriented x 3 with fluent speech, no focal motor/sensory deficits EXTREMITIES: No lower extremity edema  LABORATORY DATA:  I have reviewed the data as listed CMP Latest Ref Rng & Units 11/26/2018 11/05/2018 10/15/2018  Glucose 70 - 99 mg/dL 81 84 84  BUN 6 - 20 mg/dL '17 14 16  ' Creatinine 0.44 - 1.00 mg/dL 0.68 0.68 0.67  Sodium 135 - 145 mmol/L 140 140 139  Potassium 3.5 - 5.1 mmol/L 3.6 3.6 3.4(L)  Chloride 98 - 111 mmol/L 107 106 107  CO2 22 - 32 mmol/L '24 24 23  ' Calcium 8.9 - 10.3 mg/dL 8.9 9.2 8.7(L)  Total Protein 6.5 - 8.1 g/dL 7.0 7.0 7.0  Total Bilirubin 0.3 - 1.2 mg/dL 0.5 0.8 0.6  Alkaline Phos 38 - 126 U/L 72 73 71   AST 15 - 41 U/L '23 19 17  ' ALT 0 - 44 U/L 27 33 27    Lab Results  Component Value Date   WBC 9.3 12/17/2018   HGB 11.8 (L) 12/17/2018   HCT 36.3 12/17/2018   MCV 100.0 12/17/2018   PLT 242 12/17/2018   NEUTROABS 5.8 12/17/2018    ASSESSMENT & PLAN:  Malignant neoplasm of upper-outer quadrant of right breast in female, estrogen receptor positive ([place]) 08/20/2018:Right lumpectomy: Grade 2 IDC with DCIS, 2.2 cm, margins negative, 1/1 lymph node positive with focal extranodal extension, ER 50%, PR 50%, HER-2 positive, 3+, Ki-67 10 to 15%, T2N1  Pathology counseling: I discussed the final pathology report of the patient provided a copy of this report. I discussed the margins as well as lymph node surgeries. We also discussed the final staging along with previously performed ER/PR and HER-2/neu testing.  Recommendation: 1.Adjuvant chemotherapy with [place] followed by Herceptin Perjeta maintenance 2.Adjuvant radiation 3.Adjuvant antiestrogen therapy with tamoxifen x10 years 4.Adjuvant neratinib ---------------------------------------------------------------------------------------------------------------------------------------- Current treatment:  Cycle5day [place] Echocardiogram 07/17/2018: EF 60 to 65%  Toxicities: 1.Fatigue: If her symptoms continue to persist we may consider reducing the dosage with cycle 5 of chemo. 2.Mild diarrhea: Well responding to Imodium.  Occasionally she gets constipation now. 3.Esophagitis: I sent a prescription for Magic mouthwash.: Much improved 4.Eyelid fluttering: Probably due to mild hypokalemia. Encouraged her to eat potassium containing foods.  10/22/2018: COVID negative Return to clinicin3 weeks for cycle6 We will plan for [facility] at [facility] after chemo is complete.  She will continue Herceptin Perjeta maintenance.    No orders of the defined types were placed in this encounter.  The patient has a  good understanding of the overall plan. she agrees with it. she will call with any problems that may develop before the next visit here.  Nicholas Lose, MD 12/17/2018  Julious Oka Dorshimer am acting as scribe for Dr. Nicholas Lose.  I have reviewed the above documentation for accuracy and completeness, and I agree with the above.

## 2018-12-16 NOTE — Telephone Encounter (Signed)
Referral faxed to [facility] - release [phone]

## 2018-12-17 ENCOUNTER — Inpatient Hospital Stay: Payer: BLUE CROSS/BLUE SHIELD

## 2018-12-17 ENCOUNTER — Inpatient Hospital Stay (HOSPITAL_BASED_OUTPATIENT_CLINIC_OR_DEPARTMENT_OTHER): Payer: BLUE CROSS/BLUE SHIELD | Admitting: Hematology and Oncology

## 2018-12-17 ENCOUNTER — Other Ambulatory Visit: Payer: Self-pay

## 2018-12-17 ENCOUNTER — Encounter: Payer: Self-pay | Admitting: *Deleted

## 2018-12-17 ENCOUNTER — Inpatient Hospital Stay: Payer: BLUE CROSS/BLUE SHIELD | Attending: Hematology and Oncology

## 2018-12-17 DIAGNOSIS — Z5189 Encounter for other specified aftercare: Secondary | ICD-10-CM | POA: Insufficient documentation

## 2018-12-17 DIAGNOSIS — Z5111 Encounter for antineoplastic chemotherapy: Secondary | ICD-10-CM | POA: Diagnosis present

## 2018-12-17 DIAGNOSIS — Z170 Estrogen receptor positive status [ER+]: Secondary | ICD-10-CM

## 2018-12-17 DIAGNOSIS — C50411 Malignant neoplasm of upper-outer quadrant of right female breast: Secondary | ICD-10-CM | POA: Insufficient documentation

## 2018-12-17 DIAGNOSIS — Z95828 Presence of other vascular implants and grafts: Secondary | ICD-10-CM

## 2018-12-17 DIAGNOSIS — Z79899 Other long term (current) drug therapy: Secondary | ICD-10-CM | POA: Diagnosis not present

## 2018-12-17 DIAGNOSIS — Z5112 Encounter for antineoplastic immunotherapy: Secondary | ICD-10-CM | POA: Insufficient documentation

## 2018-12-17 DIAGNOSIS — E876 Hypokalemia: Secondary | ICD-10-CM | POA: Diagnosis not present

## 2018-12-17 LAB — CMP (CANCER CENTER ONLY)
ALT: 29 U/L (ref 0–44)
AST: 20 U/L (ref 15–41)
Albumin: 4.1 g/dL (ref 3.5–5.0)
Alkaline Phosphatase: 66 U/L (ref 38–126)
Anion gap: 10 (ref 5–15)
BUN: 16 mg/dL (ref 6–20)
CO2: 24 mmol/L (ref 22–32)
Calcium: 8.9 mg/dL (ref 8.9–10.3)
Chloride: 106 mmol/L (ref 98–111)
Creatinine: 0.69 mg/dL (ref 0.44–1.00)
GFR, Est AFR Am: >60 mL/min (ref >60)
GFR, Estimated: >60 mL/min (ref >60)
Glucose, Bld: 80 mg/dL (ref 70–99)
Potassium: 3.8 mmol/L (ref 3.5–5.1)
Sodium: 140 mmol/L (ref 135–145)
Total Bilirubin: 0.4 mg/dL (ref 0.3–1.2)
Total Protein: 6.8 g/dL (ref 6.5–8.1)

## 2018-12-17 LAB — CBC WITH DIFFERENTIAL (CANCER CENTER ONLY)
Abs Immature Granulocytes: 0.02 10*3/uL (ref 0.00–0.07)
Basophils Absolute: 0 10*3/uL (ref 0.0–0.1)
Basophils Relative: 0 %
Eosinophils Absolute: 0 10*3/uL (ref 0.0–0.5)
Eosinophils Relative: 0 %
HCT: 36.3 % (ref 36.0–46.0)
Hemoglobin: 11.8 g/dL — ABNORMAL LOW (ref 12.0–15.0)
Immature Granulocytes: 0 %
Lymphocytes Relative: 28 %
Lymphs Abs: 2.6 10*3/uL (ref 0.7–4.0)
MCH: 32.5 pg (ref 26.0–34.0)
MCHC: 32.5 g/dL (ref 30.0–36.0)
MCV: 100 fL (ref 80.0–100.0)
Monocytes Absolute: 0.9 10*3/uL (ref 0.1–1.0)
Monocytes Relative: 10 %
Neutro Abs: 5.8 10*3/uL (ref 1.7–7.7)
Neutrophils Relative %: 62 %
Platelet Count: 242 10*3/uL (ref 150–400)
RBC: 3.63 MIL/uL — ABNORMAL LOW (ref 3.87–5.11)
RDW: 15.4 % (ref 11.5–15.5)
WBC Count: 9.3 10*3/uL (ref 4.0–10.5)
nRBC: 0 % (ref 0.0–0.2)

## 2018-12-17 MED ORDER — SODIUM CHLORIDE 0.9 % IV SOLN
Freq: Once | INTRAVENOUS | Status: AC
  Administered 2018-12-17: 10:00:00 via INTRAVENOUS
  Filled 2018-12-17: qty 5

## 2018-12-17 MED ORDER — SODIUM CHLORIDE 0.9 % IV SOLN
Freq: Once | INTRAVENOUS | Status: AC
  Administered 2018-12-17: 09:00:00 via INTRAVENOUS
  Filled 2018-12-17: qty 250

## 2018-12-17 MED ORDER — PALONOSETRON HCL INJECTION 0.25 MG/5ML
0.2500 mg | Freq: Once | INTRAVENOUS | Status: AC
  Administered 2018-12-17: 0.25 mg via INTRAVENOUS

## 2018-12-17 MED ORDER — HEPARIN SOD (PORK) LOCK FLUSH 100 UNIT/ML IV SOLN
500.0000 [IU] | Freq: Once | INTRAVENOUS | Status: AC | PRN
  Administered 2018-12-17: 500 [IU]
  Filled 2018-12-17: qty 5

## 2018-12-17 MED ORDER — PALONOSETRON HCL INJECTION 0.25 MG/5ML
INTRAVENOUS | Status: AC
  Filled 2018-12-17: qty 5

## 2018-12-17 MED ORDER — SODIUM CHLORIDE 0.9% FLUSH
10.0000 mL | Freq: Once | INTRAVENOUS | Status: AC
  Administered 2018-12-17: 10 mL
  Filled 2018-12-17: qty 10

## 2018-12-17 MED ORDER — SODIUM CHLORIDE 0.9 % IV SOLN
700.0000 mg | Freq: Once | INTRAVENOUS | Status: AC
  Administered 2018-12-17: 700 mg via INTRAVENOUS
  Filled 2018-12-17: qty 70

## 2018-12-17 MED ORDER — DIPHENHYDRAMINE HCL 25 MG PO CAPS
ORAL_CAPSULE | ORAL | Status: AC
  Filled 2018-12-17: qty 2

## 2018-12-17 MED ORDER — ACETAMINOPHEN 325 MG PO TABS
ORAL_TABLET | ORAL | Status: AC
  Filled 2018-12-17: qty 2

## 2018-12-17 MED ORDER — DIPHENHYDRAMINE HCL 25 MG PO CAPS
50.0000 mg | ORAL_CAPSULE | Freq: Once | ORAL | Status: AC
  Administered 2018-12-17: 50 mg via ORAL

## 2018-12-17 MED ORDER — ACETAMINOPHEN 325 MG PO TABS
650.0000 mg | ORAL_TABLET | Freq: Once | ORAL | Status: AC
  Administered 2018-12-17: 650 mg via ORAL

## 2018-12-17 MED ORDER — TRASTUZUMAB-DKST CHEMO 150 MG IV SOLR
6.0000 mg/kg | Freq: Once | INTRAVENOUS | Status: AC
  Administered 2018-12-17: 378 mg via INTRAVENOUS
  Filled 2018-12-17: qty 18

## 2018-12-17 MED ORDER — SODIUM CHLORIDE 0.9% FLUSH
10.0000 mL | INTRAVENOUS | Status: DC | PRN
  Administered 2018-12-17: 10 mL
  Filled 2018-12-17: qty 10

## 2018-12-17 MED ORDER — SODIUM CHLORIDE 0.9 % IV SOLN
420.0000 mg | Freq: Once | INTRAVENOUS | Status: AC
  Administered 2018-12-17: 420 mg via INTRAVENOUS
  Filled 2018-12-17: qty 14

## 2018-12-17 MED ORDER — SODIUM CHLORIDE 0.9 % IV SOLN
75.0000 mg/m2 | Freq: Once | INTRAVENOUS | Status: AC
  Administered 2018-12-17: 130 mg via INTRAVENOUS
  Filled 2018-12-17: qty 13

## 2018-12-17 NOTE — Patient Instructions (Signed)
[place] Discharge Instructions for Patients Receiving Chemotherapy  Today you received the following chemotherapy agents:  Trastuzumab, pertuzumab, docetaxel, carboplatin  To help prevent nausea and vomiting after your treatment, we encourage you to take your nausea medication as prescribed.   If you develop nausea and vomiting that is not controlled by your nausea medication, call the clinic.   BELOW ARE SYMPTOMS THAT SHOULD BE REPORTED IMMEDIATELY:  *FEVER GREATER THAN 100.5 F  *CHILLS WITH OR WITHOUT FEVER  NAUSEA AND VOMITING THAT IS NOT CONTROLLED WITH YOUR NAUSEA MEDICATION  *UNUSUAL SHORTNESS OF BREATH  *UNUSUAL BRUISING OR BLEEDING  TENDERNESS IN MOUTH AND THROAT WITH OR WITHOUT PRESENCE OF ULCERS  *URINARY PROBLEMS  *BOWEL PROBLEMS  UNUSUAL RASH Items with * indicate a potential emergency and should be followed up as soon as possible.  Feel free to call the clinic should you have any questions or concerns. The clinic phone number is [phone].  Please show the [place] at check-in to the Emergency Department and triage nurse.

## 2018-12-17 NOTE — Patient Instructions (Signed)

## 2018-12-17 NOTE — Assessment & Plan Note (Signed)
08/20/2018:Right lumpectomy: Grade 2 IDC with DCIS, 2.2 cm, margins negative, 1/1 lymph node positive with focal extranodal extension, ER 50%, PR 50%, HER-2 positive, 3+, Ki-67 10 to 15%, T2N1  Pathology counseling: I discussed the final pathology report of the patient provided a copy of this report. I discussed the margins as well as lymph node surgeries. We also discussed the final staging along with previously performed ER/PR and HER-2/neu testing.  Recommendation: 1.Adjuvant chemotherapy with [place] followed by Herceptin Perjeta maintenance 2.Adjuvant radiation 3.Adjuvant antiestrogen therapy with tamoxifen x10 years 4.Adjuvant neratinib ---------------------------------------------------------------------------------------------------------------------------------------- Current treatment: Cycle5day [place] Perjeta Echocardiogram 07/17/2018: EF 60 to 65%  Toxicities: 1.Fatigue: If her symptoms continue to persist we may consider reducing the dosage with cycle 5 of chemo. 2.Mild diarrhea: Well responding to Imodium. 3.Esophagitis: I sent a prescription for Magic mouthwash.: Much improved 4.Eyelid fluttering: Probably due to mild hypokalemia. Encouraged her to eat potassium containing foods.  10/22/2018: COVID negative Return to clinicin3 weeks for cycle6 We will plan for radiation after chemo is complete.  She will continue Herceptin Perjeta maintenance.

## 2018-12-17 NOTE — Progress Notes (Signed)
Continue full dose chemo per MD.  Hardie Pulley, PharmD, BCPS, BCOP

## 2018-12-18 ENCOUNTER — Telehealth: Payer: Self-pay | Admitting: Hematology and Oncology

## 2018-12-18 NOTE — Telephone Encounter (Signed)
I talk with patient regarding schedule  

## 2018-12-19 ENCOUNTER — Inpatient Hospital Stay: Payer: BLUE CROSS/BLUE SHIELD

## 2018-12-19 ENCOUNTER — Other Ambulatory Visit: Payer: Self-pay

## 2018-12-19 VITALS — BP 125/75 | HR 83 | Temp 98.5°F | Resp 18

## 2018-12-19 DIAGNOSIS — C50411 Malignant neoplasm of upper-outer quadrant of right female breast: Secondary | ICD-10-CM | POA: Diagnosis not present

## 2018-12-19 DIAGNOSIS — Z170 Estrogen receptor positive status [ER+]: Secondary | ICD-10-CM

## 2018-12-19 MED ORDER — PEGFILGRASTIM-CBQV 6 MG/0.6ML ~~LOC~~ SOSY
6.0000 mg | PREFILLED_SYRINGE | Freq: Once | SUBCUTANEOUS | Status: AC
  Administered 2018-12-19: 6 mg via SUBCUTANEOUS

## 2018-12-19 MED ORDER — PEGFILGRASTIM-CBQV 6 MG/0.6ML ~~LOC~~ SOSY
PREFILLED_SYRINGE | SUBCUTANEOUS | Status: AC
  Filled 2018-12-19: qty 0.6

## 2019-01-06 NOTE — Progress Notes (Signed)
Patient Care Team: [name], MD as PCP - General (Internal Medicine) Mauro Kaufmann, RN as Oncology Nurse Navigator [name], RN as Oncology Nurse Navigator  DIAGNOSIS:    ICD-10-CM   1. Malignant neoplasm of upper-outer quadrant of right breast in female, estrogen receptor positive ([place])  C50.411    Z17.0     SUMMARY OF ONCOLOGIC HISTORY: Oncology History  Malignant neoplasm of upper-outer quadrant of right breast in female, estrogen receptor positive ([place])  06/26/2018 Initial Diagnosis   [place]: Screening mammogram detected focal asymmetry in the right breast, ultrasound revealed 2 cm oval mass: Biopsy revealed grade 1-2 invasive ductal carcinoma, ER 3+, PR 3+, HER-2 3+ positive T1c M0 stage Ia clinical stage   07/23/2018 Cancer Staging   Staging form: Breast, AJCC 8th Edition - Clinical stage from 07/23/2018: Stage IA (cT1c, cN0, cM0, G2, ER+, PR+, HER2+) - Signed by Nicholas Lose, MD on 07/23/2018   08/20/2018 Surgery   Right lumpectomy: Grade 2 IDC with DCIS, 2.2 cm, margins negative, 1/1 lymph node positive with focal extranodal extension, ER 50%, PR 50%, HER-2 positive, 3+, Ki-67 10 to 15%, T2N1   09/03/2018 Cancer Staging   Staging form: Breast, AJCC 8th Edition - Pathologic: Stage IB (pT2, pN1a, cM0, G2, ER+, PR+, HER2+) - Signed by Gardenia Phlegm, NP on 09/03/2018   09/23/2018 -  Chemotherapy   Adjuvant chemo with [place] Perjeta     CHIEF COMPLIANT: Cycle 6 TCHP  INTERVAL HISTORY: Megan Acevedo is a 48 y.o. with above-mentioned history of HER2 positive right breast cancer who underwent a lumpectomyand is currently on adjuvant chemotherapy withTCHP. She presents to the clinic todayfor cycle 6.   REVIEW OF SYSTEMS:   Constitutional: Denies fevers, chills or abnormal weight loss Eyes: Denies blurriness of vision Ears, nose, mouth, throat, and face: Denies mucositis or sore throat Respiratory: Denies cough, dyspnea or  wheezes Cardiovascular: Denies palpitation, chest discomfort Gastrointestinal: Denies nausea, heartburn or change in bowel habits Skin: Denies abnormal skin rashes Lymphatics: Denies new lymphadenopathy or easy bruising Neurological: Denies numbness, tingling or new weaknesses Behavioral/Psych: Mood is stable, no new changes  Extremities: No lower extremity edema Breast: denies any pain or lumps or nodules in either breasts All other systems were reviewed with the patient and are negative.  I have reviewed the past medical history, past surgical history, social history and family history with the patient and they are unchanged from previous note.  ALLERGIES:  has No Known Allergies.  MEDICATIONS:  Current Outpatient Medications  Medication Sig Dispense Refill   acetaminophen (TYLENOL) 325 MG tablet Take 650 mg by mouth every 6 (six) hours as needed.     benzoyl peroxide-erythromycin (BENZAMYCIN) gel Apply topically 2 (two) times daily. 23.3 g 0   lidocaine-prilocaine (EMLA) cream Apply to affected area once 30 g 3   LORazepam (ATIVAN) 0.5 MG tablet Take 1 tablet (0.5 mg total) by mouth at bedtime as needed (Nausea or vomiting). 30 tablet 0   Multiple Vitamins-Minerals (CENTRUM SILVER 50+WOMEN) TABS Take 1 each by mouth every morning.     omeprazole (PRILOSEC) 20 MG capsule Take 20 mg by mouth daily.     ondansetron (ZOFRAN) 8 MG tablet Take 1 tablet (8 mg total) by mouth 2 (two) times daily as needed (Nausea or vomiting). Begin 4 days after chemotherapy. 30 tablet 1   oxybutynin (DITROPAN) 5 MG tablet Take 5 mg by mouth 3 (three) times daily.     prochlorperazine (COMPAZINE)  10 MG tablet Take 1 tablet (10 mg total) by mouth every 6 (six) hours as needed (Nausea or vomiting). 30 tablet 1   No current facility-administered medications for this visit.     PHYSICAL EXAMINATION: ECOG PERFORMANCE STATUS: 1 - Symptomatic but completely ambulatory  Vitals:   01/07/19 0830  BP:  (!) 144/79  Pulse: 98  Resp: 18  Temp: 98.7 F (37.1 C)  SpO2: 100%   Filed Weights   01/07/19 0830  Weight: 143 lb 1.6 oz (64.9 kg)    GENERAL: alert, no distress and comfortable SKIN: skin color, texture, turgor are normal, no rashes or significant lesions EYES: normal, Conjunctiva are pink and non-injected, sclera clear OROPHARYNX: no exudate, no erythema and lips, buccal mucosa, and tongue normal  NECK: supple, thyroid normal size, non-tender, without nodularity LYMPH: no palpable lymphadenopathy in the cervical, axillary or inguinal LUNGS: clear to auscultation and percussion with normal breathing effort HEART: regular rate & rhythm and no murmurs and no lower extremity edema ABDOMEN: abdomen soft, non-tender and normal bowel sounds MUSCULOSKELETAL: no cyanosis of digits and no clubbing  NEURO: alert & oriented x 3 with fluent speech, no focal motor/sensory deficits EXTREMITIES: No lower extremity edema  LABORATORY DATA:  I have reviewed the data as listed CMP Latest Ref Rng & Units 12/17/2018 11/26/2018 11/05/2018  Glucose 70 - 99 mg/dL 80 81 84  BUN 6 - 20 mg/dL '16 17 14  ' Creatinine 0.44 - 1.00 mg/dL 0.69 0.68 0.68  Sodium 135 - 145 mmol/L 140 140 140  Potassium 3.5 - 5.1 mmol/L 3.8 3.6 3.6  Chloride 98 - 111 mmol/L 106 107 106  CO2 22 - 32 mmol/L '24 24 24  ' Calcium 8.9 - 10.3 mg/dL 8.9 8.9 9.2  Total Protein 6.5 - 8.1 g/dL 6.8 7.0 7.0  Total Bilirubin 0.3 - 1.2 mg/dL 0.4 0.5 0.8  Alkaline Phos 38 - 126 U/L 66 72 73  AST 15 - 41 U/L '20 23 19  ' ALT 0 - 44 U/L 29 27 33    Lab Results  Component Value Date   WBC 7.1 01/07/2019   HGB 10.6 (L) 01/07/2019   HCT 33.2 (L) 01/07/2019   MCV 102.8 (H) 01/07/2019   PLT 201 01/07/2019   NEUTROABS 4.1 01/07/2019    ASSESSMENT & PLAN:  Malignant neoplasm of upper-outer quadrant of right breast in female, estrogen receptor positive ([place]) 08/20/2018:Right lumpectomy: Grade 2 IDC with DCIS, 2.2 cm, margins negative, 1/1 lymph  node positive with focal extranodal extension, ER 50%, PR 50%, HER-2 positive, 3+, Ki-67 10 to 15%, T2N1  Pathology counseling: I discussed the final pathology report of the patient provided a copy of this report. I discussed the margins as well as lymph node surgeries. We also discussed the final staging along with previously performed ER/PR and HER-2/neu testing.  Recommendation: 1.Adjuvant chemotherapy with [place] followed by Herceptin Perjeta maintenance 2.Adjuvant radiation 3.Adjuvant antiestrogen therapy with tamoxifen x10 years 4.Adjuvant neratinib ---------------------------------------------------------------------------------------------------------------------------------------- Current treatment: Cycle6day [place] Perjeta Echocardiogram 07/17/2018: EF 60 to 65%  Toxicities: 1.Fatigue 2.Mild diarrhea: Well responding to Imodium.  Occasionally she gets constipation now. 3.Esophagitis: I sent a prescription for Magic mouthwash.: Much improved 4.Eyelid fluttering: Probably due to mild hypokalemia. Encouraged her to eat potassium containing foods. 5.  Deconditioning and chemotherapy-induced anemia: Being monitored  10/22/2018: COVID negative  We will plan for radiation at [facility] after chemo is complete.  She will continue Herceptin Perjeta maintenance every 3 weeks. Labs will be done every other treatment.  Once radiation is complete then we will start antiestrogen therapy   No orders of the defined types were placed in this encounter.  The patient has a good understanding of the overall plan. she agrees with it. she will call with any problems that may develop before the next visit here.  Nicholas Lose, MD 01/07/2019  Julious Oka Dorshimer am acting as scribe for Dr. Nicholas Lose.  I have reviewed the above documentation for accuracy and completeness, and I agree with the above.

## 2019-01-07 ENCOUNTER — Inpatient Hospital Stay (HOSPITAL_BASED_OUTPATIENT_CLINIC_OR_DEPARTMENT_OTHER): Payer: BLUE CROSS/BLUE SHIELD | Admitting: Hematology and Oncology

## 2019-01-07 ENCOUNTER — Inpatient Hospital Stay: Payer: BLUE CROSS/BLUE SHIELD

## 2019-01-07 ENCOUNTER — Other Ambulatory Visit: Payer: Self-pay

## 2019-01-07 ENCOUNTER — Encounter: Payer: Self-pay | Admitting: *Deleted

## 2019-01-07 DIAGNOSIS — Z170 Estrogen receptor positive status [ER+]: Secondary | ICD-10-CM | POA: Diagnosis not present

## 2019-01-07 DIAGNOSIS — C50411 Malignant neoplasm of upper-outer quadrant of right female breast: Secondary | ICD-10-CM

## 2019-01-07 DIAGNOSIS — Z95828 Presence of other vascular implants and grafts: Secondary | ICD-10-CM

## 2019-01-07 LAB — CMP (CANCER CENTER ONLY)
ALT: 17 U/L (ref 0–44)
AST: 16 U/L (ref 15–41)
Albumin: 3.8 g/dL (ref 3.5–5.0)
Alkaline Phosphatase: 65 U/L (ref 38–126)
Anion gap: 8 (ref 5–15)
BUN: 11 mg/dL (ref 6–20)
CO2: 25 mmol/L (ref 22–32)
Calcium: 8.7 mg/dL — ABNORMAL LOW (ref 8.9–10.3)
Chloride: 108 mmol/L (ref 98–111)
Creatinine: 0.63 mg/dL (ref 0.44–1.00)
GFR, Est AFR Am: >60 mL/min (ref >60)
GFR, Estimated: >60 mL/min (ref >60)
Glucose, Bld: 96 mg/dL (ref 70–99)
Potassium: 3.9 mmol/L (ref 3.5–5.1)
Sodium: 141 mmol/L (ref 135–145)
Total Bilirubin: 0.4 mg/dL (ref 0.3–1.2)
Total Protein: 6.2 g/dL — ABNORMAL LOW (ref 6.5–8.1)

## 2019-01-07 LAB — CBC WITH DIFFERENTIAL (CANCER CENTER ONLY)
Abs Immature Granulocytes: 0.02 10*3/uL (ref 0.00–0.07)
Basophils Absolute: 0 10*3/uL (ref 0.0–0.1)
Basophils Relative: 0 %
Eosinophils Absolute: 0 10*3/uL (ref 0.0–0.5)
Eosinophils Relative: 0 %
HCT: 33.2 % — ABNORMAL LOW (ref 36.0–46.0)
Hemoglobin: 10.6 g/dL — ABNORMAL LOW (ref 12.0–15.0)
Immature Granulocytes: 0 %
Lymphocytes Relative: 31 %
Lymphs Abs: 2.2 10*3/uL (ref 0.7–4.0)
MCH: 32.8 pg (ref 26.0–34.0)
MCHC: 31.9 g/dL (ref 30.0–36.0)
MCV: 102.8 fL — ABNORMAL HIGH (ref 80.0–100.0)
Monocytes Absolute: 0.8 10*3/uL (ref 0.1–1.0)
Monocytes Relative: 11 %
Neutro Abs: 4.1 10*3/uL (ref 1.7–7.7)
Neutrophils Relative %: 58 %
Platelet Count: 201 10*3/uL (ref 150–400)
RBC: 3.23 MIL/uL — ABNORMAL LOW (ref 3.87–5.11)
RDW: 15 % (ref 11.5–15.5)
WBC Count: 7.1 10*3/uL (ref 4.0–10.5)
nRBC: 0 % (ref 0.0–0.2)

## 2019-01-07 MED ORDER — PALONOSETRON HCL INJECTION 0.25 MG/5ML
0.2500 mg | Freq: Once | INTRAVENOUS | Status: AC
  Administered 2019-01-07: 0.25 mg via INTRAVENOUS

## 2019-01-07 MED ORDER — SODIUM CHLORIDE 0.9 % IV SOLN
700.0000 mg | Freq: Once | INTRAVENOUS | Status: AC
  Administered 2019-01-07: 700 mg via INTRAVENOUS
  Filled 2019-01-07: qty 70

## 2019-01-07 MED ORDER — DIPHENHYDRAMINE HCL 25 MG PO CAPS
ORAL_CAPSULE | ORAL | Status: AC
  Filled 2019-01-07: qty 2

## 2019-01-07 MED ORDER — SODIUM CHLORIDE 0.9 % IV SOLN
75.0000 mg/m2 | Freq: Once | INTRAVENOUS | Status: AC
  Administered 2019-01-07: 130 mg via INTRAVENOUS
  Filled 2019-01-07: qty 13

## 2019-01-07 MED ORDER — DIPHENHYDRAMINE HCL 25 MG PO CAPS
50.0000 mg | ORAL_CAPSULE | Freq: Once | ORAL | Status: AC
  Administered 2019-01-07: 50 mg via ORAL

## 2019-01-07 MED ORDER — TRASTUZUMAB-DKST CHEMO 150 MG IV SOLR
6.0000 mg/kg | Freq: Once | INTRAVENOUS | Status: AC
  Administered 2019-01-07: 378 mg via INTRAVENOUS
  Filled 2019-01-07: qty 18

## 2019-01-07 MED ORDER — ACETAMINOPHEN 325 MG PO TABS
ORAL_TABLET | ORAL | Status: AC
  Filled 2019-01-07: qty 2

## 2019-01-07 MED ORDER — SODIUM CHLORIDE 0.9% FLUSH
10.0000 mL | Freq: Once | INTRAVENOUS | Status: AC
  Administered 2019-01-07: 10 mL
  Filled 2019-01-07: qty 10

## 2019-01-07 MED ORDER — SODIUM CHLORIDE 0.9 % IV SOLN
420.0000 mg | Freq: Once | INTRAVENOUS | Status: AC
  Administered 2019-01-07: 11:00:00 420 mg via INTRAVENOUS
  Filled 2019-01-07: qty 14

## 2019-01-07 MED ORDER — SODIUM CHLORIDE 0.9 % IV SOLN
Freq: Once | INTRAVENOUS | Status: AC
  Administered 2019-01-07: 10:00:00 via INTRAVENOUS
  Filled 2019-01-07: qty 5

## 2019-01-07 MED ORDER — SODIUM CHLORIDE 0.9 % IV SOLN
Freq: Once | INTRAVENOUS | Status: AC
  Administered 2019-01-07: 09:00:00 via INTRAVENOUS
  Filled 2019-01-07: qty 250

## 2019-01-07 MED ORDER — ACETAMINOPHEN 325 MG PO TABS
650.0000 mg | ORAL_TABLET | Freq: Once | ORAL | Status: AC
  Administered 2019-01-07: 650 mg via ORAL

## 2019-01-07 MED ORDER — PALONOSETRON HCL INJECTION 0.25 MG/5ML
INTRAVENOUS | Status: AC
  Filled 2019-01-07: qty 5

## 2019-01-07 MED ORDER — HEPARIN SOD (PORK) LOCK FLUSH 100 UNIT/ML IV SOLN
500.0000 [IU] | Freq: Once | INTRAVENOUS | Status: AC | PRN
  Administered 2019-01-07: 500 [IU]
  Filled 2019-01-07: qty 5

## 2019-01-07 MED ORDER — SODIUM CHLORIDE 0.9% FLUSH
10.0000 mL | INTRAVENOUS | Status: DC | PRN
  Administered 2019-01-07: 10 mL
  Filled 2019-01-07: qty 10

## 2019-01-07 NOTE — Assessment & Plan Note (Signed)
08/20/2018:Right lumpectomy: Grade 2 IDC with DCIS, 2.2 cm, margins negative, 1/1 lymph node positive with focal extranodal extension, ER 50%, PR 50%, HER-2 positive, 3+, Ki-67 10 to 15%, T2N1  Pathology counseling: I discussed the final pathology report of the patient provided a copy of this report. I discussed the margins as well as lymph node surgeries. We also discussed the final staging along with previously performed ER/PR and HER-2/neu testing.  Recommendation: 1.Adjuvant chemotherapy with [place] followed by Herceptin Perjeta maintenance 2.Adjuvant radiation 3.Adjuvant antiestrogen therapy with tamoxifen x10 years 4.Adjuvant neratinib ---------------------------------------------------------------------------------------------------------------------------------------- Current treatment: Cycle6day [place] Perjeta Echocardiogram 07/17/2018: EF 60 to 65%  Toxicities: 1.Fatigue 2.Mild diarrhea: Well responding to Imodium.  Occasionally she gets constipation now. 3.Esophagitis: I sent a prescription for Magic mouthwash.: Much improved 4.Eyelid fluttering: Probably due to mild hypokalemia. Encouraged her to eat potassium containing foods.  10/22/2018: COVID negative  We will plan for radiation [facility] after chemo is complete.  She will continue Herceptin Perjeta maintenance every 3 weeks.

## 2019-01-07 NOTE — Patient Instructions (Signed)
[facility] Discharge Instructions for Patients Receiving Chemotherapy  Today you received the following chemotherapy agents: Trastuzumab, Perjeta, Taxotere, Carboplatin  To help prevent nausea and vomiting after your treatment, we encourage you to take your nausea medication as directed.   If you develop nausea and vomiting that is not controlled by your nausea medication, call the clinic.   BELOW ARE SYMPTOMS THAT SHOULD BE REPORTED IMMEDIATELY:  *FEVER GREATER THAN 100.5 F  *CHILLS WITH OR WITHOUT FEVER  NAUSEA AND VOMITING THAT IS NOT CONTROLLED WITH YOUR NAUSEA MEDICATION  *UNUSUAL SHORTNESS OF BREATH  *UNUSUAL BRUISING OR BLEEDING  TENDERNESS IN MOUTH AND THROAT WITH OR WITHOUT PRESENCE OF ULCERS  *URINARY PROBLEMS  *BOWEL PROBLEMS  UNUSUAL RASH Items with * indicate a potential emergency and should be followed up as soon as possible.  Feel free to call the clinic should you have any questions or concerns. The clinic phone number is [phone].  Please show the CHEMO ALERT CARD at check-in to the Emergency Department and triage nurse.   

## 2019-01-08 ENCOUNTER — Other Ambulatory Visit: Payer: Self-pay

## 2019-01-08 ENCOUNTER — Inpatient Hospital Stay: Payer: BLUE CROSS/BLUE SHIELD | Attending: Hematology and Oncology

## 2019-01-08 VITALS — BP 128/72 | HR 78 | Temp 98.2°F | Resp 18

## 2019-01-08 DIAGNOSIS — C50411 Malignant neoplasm of upper-outer quadrant of right female breast: Secondary | ICD-10-CM | POA: Diagnosis present

## 2019-01-08 DIAGNOSIS — Z5189 Encounter for other specified aftercare: Secondary | ICD-10-CM | POA: Insufficient documentation

## 2019-01-08 DIAGNOSIS — Z5112 Encounter for antineoplastic immunotherapy: Secondary | ICD-10-CM | POA: Insufficient documentation

## 2019-01-08 DIAGNOSIS — Z5111 Encounter for antineoplastic chemotherapy: Secondary | ICD-10-CM | POA: Diagnosis present

## 2019-01-08 MED ORDER — PEGFILGRASTIM-CBQV 6 MG/0.6ML ~~LOC~~ SOSY
6.0000 mg | PREFILLED_SYRINGE | Freq: Once | SUBCUTANEOUS | Status: AC
  Administered 2019-01-08: 6 mg via SUBCUTANEOUS

## 2019-01-08 MED ORDER — PEGFILGRASTIM-CBQV 6 MG/0.6ML ~~LOC~~ SOSY
PREFILLED_SYRINGE | SUBCUTANEOUS | Status: AC
  Filled 2019-01-08: qty 0.6

## 2019-01-08 NOTE — Patient Instructions (Signed)

## 2019-01-09 ENCOUNTER — Ambulatory Visit: Payer: BLUE CROSS/BLUE SHIELD

## 2019-01-19 ENCOUNTER — Telehealth: Payer: Self-pay | Admitting: Hematology and Oncology

## 2019-01-19 NOTE — Telephone Encounter (Signed)
Returned patient's phone call regarding rescheduling an appointment, per patient's request 10/21 appointment time has been changed.

## 2019-01-28 ENCOUNTER — Other Ambulatory Visit: Payer: Self-pay

## 2019-01-28 ENCOUNTER — Inpatient Hospital Stay: Payer: BLUE CROSS/BLUE SHIELD

## 2019-01-28 ENCOUNTER — Ambulatory Visit: Payer: BLUE CROSS/BLUE SHIELD

## 2019-01-28 VITALS — BP 128/69 | HR 90 | Temp 98.7°F | Resp 17

## 2019-01-28 DIAGNOSIS — C50411 Malignant neoplasm of upper-outer quadrant of right female breast: Secondary | ICD-10-CM

## 2019-01-28 DIAGNOSIS — Z170 Estrogen receptor positive status [ER+]: Secondary | ICD-10-CM

## 2019-01-28 MED ORDER — ACETAMINOPHEN 325 MG PO TABS
ORAL_TABLET | ORAL | Status: AC
  Filled 2019-01-28: qty 2

## 2019-01-28 MED ORDER — DIPHENHYDRAMINE HCL 25 MG PO CAPS
50.0000 mg | ORAL_CAPSULE | Freq: Once | ORAL | Status: AC
  Administered 2019-01-28: 14:00:00 50 mg via ORAL

## 2019-01-28 MED ORDER — TRASTUZUMAB CHEMO 150 MG IV SOLR
6.0000 mg/kg | Freq: Once | INTRAVENOUS | Status: AC
  Administered 2019-01-28: 15:00:00 378 mg via INTRAVENOUS
  Filled 2019-01-28: qty 18

## 2019-01-28 MED ORDER — SODIUM CHLORIDE 0.9 % IV SOLN
Freq: Once | INTRAVENOUS | Status: AC
  Administered 2019-01-28: 14:00:00 via INTRAVENOUS
  Filled 2019-01-28: qty 250

## 2019-01-28 MED ORDER — SODIUM CHLORIDE 0.9% FLUSH
10.0000 mL | INTRAVENOUS | Status: DC | PRN
  Administered 2019-01-28: 10 mL
  Filled 2019-01-28: qty 10

## 2019-01-28 MED ORDER — HEPARIN SOD (PORK) LOCK FLUSH 100 UNIT/ML IV SOLN
500.0000 [IU] | Freq: Once | INTRAVENOUS | Status: AC | PRN
  Administered 2019-01-28: 17:00:00 500 [IU]
  Filled 2019-01-28: qty 5

## 2019-01-28 MED ORDER — DIPHENHYDRAMINE HCL 25 MG PO CAPS
ORAL_CAPSULE | ORAL | Status: AC
  Filled 2019-01-28: qty 2

## 2019-01-28 MED ORDER — SODIUM CHLORIDE 0.9 % IV SOLN
420.0000 mg | Freq: Once | INTRAVENOUS | Status: AC
  Administered 2019-01-28: 16:00:00 420 mg via INTRAVENOUS
  Filled 2019-01-28: qty 14

## 2019-01-28 MED ORDER — ACETAMINOPHEN 325 MG PO TABS
650.0000 mg | ORAL_TABLET | Freq: Once | ORAL | Status: AC
  Administered 2019-01-28: 650 mg via ORAL

## 2019-01-28 NOTE — Patient Instructions (Addendum)
[facility] Discharge Instructions for Patients Receiving Chemotherapy  Today you received the following chemotherapy agents: Trastuzumab, Pertuzumab  To help prevent nausea and vomiting after your treatment, we encourage you to take your nausea medication as directed.   If you develop nausea and vomiting that is not controlled by your nausea medication, call the clinic.   BELOW ARE SYMPTOMS THAT SHOULD BE REPORTED IMMEDIATELY:  *FEVER GREATER THAN 100.5 F  *CHILLS WITH OR WITHOUT FEVER  NAUSEA AND VOMITING THAT IS NOT CONTROLLED WITH YOUR NAUSEA MEDICATION  *UNUSUAL SHORTNESS OF BREATH  *UNUSUAL BRUISING OR BLEEDING  TENDERNESS IN MOUTH AND THROAT WITH OR WITHOUT PRESENCE OF ULCERS  *URINARY PROBLEMS  *BOWEL PROBLEMS  UNUSUAL RASH Items with * indicate a potential emergency and should be followed up as soon as possible.  Feel free to call the clinic should you have any questions or concerns. The clinic phone number is [phone].  Please show the CHEMO ALERT CARD at check-in to the Emergency Department and triage nurse.   

## 2019-02-17 NOTE — Progress Notes (Signed)
Patient Care Team: [name], MD as PCP - General (Internal Medicine) Mauro Kaufmann, RN as Oncology Nurse Navigator [name], RN as Oncology Nurse Navigator  DIAGNOSIS:    ICD-10-CM   1. Malignant neoplasm of upper-outer quadrant of right breast in female, estrogen receptor positive ([place])  C50.411 ECHOCARDIOGRAM COMPLETE   Z17.0     SUMMARY OF ONCOLOGIC HISTORY: Oncology History  Malignant neoplasm of upper-outer quadrant of right breast in female, estrogen receptor positive ([place])  06/26/2018 Initial Diagnosis   [place]: Screening mammogram detected focal asymmetry in the right breast, ultrasound revealed 2 cm oval mass: Biopsy revealed grade 1-2 invasive ductal carcinoma, ER 3+, PR 3+, HER-2 3+ positive T1c M0 stage Ia clinical stage   07/23/2018 Cancer Staging   Staging form: Breast, AJCC 8th Edition - Clinical stage from 07/23/2018: Stage IA (cT1c, cN0, cM0, G2, ER+, PR+, HER2+) - Signed by Nicholas Lose, MD on 07/23/2018   08/20/2018 Surgery   Right lumpectomy: Grade 2 IDC with DCIS, 2.2 cm, margins negative, 1/1 lymph node positive with focal extranodal extension, ER 50%, PR 50%, HER-2 positive, 3+, Ki-67 10 to 15%, T2N1   09/03/2018 Cancer Staging   Staging form: Breast, AJCC 8th Edition - Pathologic: Stage IB (pT2, pN1a, cM0, G2, ER+, PR+, HER2+) - Signed by Gardenia Phlegm, NP on 09/03/2018   09/23/2018 -  Chemotherapy   Adjuvant chemo with [place] Perjeta     CHIEF COMPLIANT: Follow-up of right breast cancer on Herceptin Perjeta miantenance  INTERVAL HISTORY: Megan Acevedo is a 48 y.o. with above-mentioned history of HER2 positive right breast cancer who underwent a lumpectomy, completed adjuvant chemotherapy withTCHP, and is currently on Herceptin Perjeta miantenance. She presents to the clinic today for treatment.  She is undergoing adjuvant radiation at [facility]. She has had leg cramps which are improving.  Denies any diarrhea.   Radiation started a week ago.  Tolerating it well.  REVIEW OF SYSTEMS:   Constitutional: Denies fevers, chills or abnormal weight loss Eyes: Denies blurriness of vision Ears, nose, mouth, throat, and face: Denies mucositis or sore throat Respiratory: Denies cough, dyspnea or wheezes Cardiovascular: Denies palpitation, chest discomfort Gastrointestinal: Denies nausea, heartburn or change in bowel habits Skin: Denies abnormal skin rashes Lymphatics: Denies new lymphadenopathy or easy bruising Neurological: Denies numbness, tingling or new weaknesses Behavioral/Psych: Mood is stable, no new changes  Extremities: No lower extremity edema Breast: denies any pain or lumps or nodules in either breasts All other systems were reviewed with the patient and are negative.  I have reviewed the past medical history, past surgical history, social history and family history with the patient and they are unchanged from previous note.  ALLERGIES:  has No Known Allergies.  MEDICATIONS:  Current Outpatient Medications  Medication Sig Dispense Refill  . acetaminophen (TYLENOL) 325 MG tablet Take 650 mg by mouth every 6 (six) hours as needed.    . benzoyl peroxide-erythromycin (BENZAMYCIN) gel Apply topically 2 (two) times daily. 23.3 g 0  . lidocaine-prilocaine (EMLA) cream Apply to affected area once 30 g 3  . LORazepam (ATIVAN) 0.5 MG tablet Take 1 tablet (0.5 mg total) by mouth at bedtime as needed (Nausea or vomiting). 30 tablet 0  . Multiple Vitamins-Minerals (CENTRUM SILVER 50+WOMEN) TABS Take 1 each by mouth every morning.    Marland Kitchen omeprazole (PRILOSEC) 20 MG capsule Take 20 mg by mouth daily.    . ondansetron (ZOFRAN) 8 MG tablet Take 1 tablet (8 mg total)  by mouth 2 (two) times daily as needed (Nausea or vomiting). Begin 4 days after chemotherapy. 30 tablet 1  . oxybutynin (DITROPAN) 5 MG tablet Take 5 mg by mouth 3 (three) times daily.    . prochlorperazine (COMPAZINE) 10 MG tablet Take 1 tablet (10  mg total) by mouth every 6 (six) hours as needed (Nausea or vomiting). 30 tablet 1   No current facility-administered medications for this visit.     PHYSICAL EXAMINATION: ECOG PERFORMANCE STATUS: 1 - Symptomatic but completely ambulatory  Vitals:   02/18/19 0821  BP: (!) 141/86  Pulse: 89  Resp: 17  Temp: 98.3 F (36.8 C)  SpO2: 100%   Filed Weights   02/18/19 0821  Weight: 141 lb 1.6 oz (64 kg)    GENERAL: alert, no distress and comfortable SKIN: skin color, texture, turgor are normal, no rashes or significant lesions EYES: normal, Conjunctiva are pink and non-injected, sclera clear OROPHARYNX: no exudate, no erythema and lips, buccal mucosa, and tongue normal  NECK: supple, thyroid normal size, non-tender, without nodularity LYMPH: no palpable lymphadenopathy in the cervical, axillary or inguinal LUNGS: clear to auscultation and percussion with normal breathing effort HEART: regular rate & rhythm and no murmurs and no lower extremity edema ABDOMEN: abdomen soft, non-tender and normal bowel sounds MUSCULOSKELETAL: no cyanosis of digits and no clubbing  NEURO: alert & oriented x 3 with fluent speech, no focal motor/sensory deficits EXTREMITIES: No lower extremity edema  LABORATORY DATA:  I have reviewed the data as listed CMP Latest Ref Rng & Units 01/07/2019 12/17/2018 11/26/2018  Glucose 70 - 99 mg/dL 96 80 81  BUN 6 - 20 mg/dL '11 16 17  ' Creatinine 0.44 - 1.00 mg/dL 0.63 0.69 0.68  Sodium 135 - 145 mmol/L 141 140 140  Potassium 3.5 - 5.1 mmol/L 3.9 3.8 3.6  Chloride 98 - 111 mmol/L 108 106 107  CO2 22 - 32 mmol/L '25 24 24  ' Calcium 8.9 - 10.3 mg/dL 8.7(L) 8.9 8.9  Total Protein 6.5 - 8.1 g/dL 6.2(L) 6.8 7.0  Total Bilirubin 0.3 - 1.2 mg/dL 0.4 0.4 0.5  Alkaline Phos 38 - 126 U/L 65 66 72  AST 15 - 41 U/L '16 20 23  ' ALT 0 - 44 U/L '17 29 27    ' Lab Results  Component Value Date   WBC 3.3 (L) 02/18/2019   HGB 12.1 02/18/2019   HCT 37.8 02/18/2019   MCV 102.7 (H)  02/18/2019   PLT 191 02/18/2019   NEUTROABS 2.0 02/18/2019    ASSESSMENT & PLAN:  Malignant neoplasm of upper-outer quadrant of right breast in female, estrogen receptor positive ([place]) 08/20/2018:Right lumpectomy: Grade 2 IDC with DCIS, 2.2 cm, margins negative, 1/1 lymph node positive with focal extranodal extension, ER 50%, PR 50%, HER-2 positive, 3+, Ki-67 10 to 15%, T2N1  Recommendation: 1.Adjuvant chemotherapy with TCH Perjeta completed 01/07/2019 followed by Herceptin Perjeta maintenance 2.Adjuvant radiation at [facility] 3.Adjuvant antiestrogen therapy with tamoxifen x10 years 4.Adjuvant neratinib ---------------------------------------------------------------------------------------------------------------------------------------- Current treatment: Herceptin and Perjeta maintenance Echocardiogram 07/17/2018: EF 60 to 65%  Toxicities: 1.Fatigue 2.Mild diarrhea: Well responding to Imodium.Occasionally she gets constipation now.  ECHO will be ordered  10/22/2018: COVID negative Once radiation is complete then we will start antiestrogen therapy  Return to clinic every 3 weeks for Herceptin Perjeta and every 6 weeks for follow-up with me.    Orders Placed This Encounter  Procedures  . ECHOCARDIOGRAM COMPLETE    Standing Status:   Future    Standing Expiration Date:  05/20/2020    Order Specific Question:   Where should this test be performed    Answer:   [place]    Order Specific Question:   Perflutren DEFINITY (image enhancing agent) should be administered unless hypersensitivity or allergy exist    Answer:   Administer Perflutren    Order Specific Question:   Reason for exam-Echo    Answer:   Chemo  V67.2 / Z09   The patient has a good understanding of the overall plan. she agrees with it. she will call with any problems that may develop before the next visit here.  Nicholas Lose, MD 02/18/2019  Julious Oka Dorshimer am acting as scribe for Dr.  Nicholas Lose.  I have reviewed the above documentation for accuracy and completeness, and I agree with the above.

## 2019-02-18 ENCOUNTER — Inpatient Hospital Stay (HOSPITAL_BASED_OUTPATIENT_CLINIC_OR_DEPARTMENT_OTHER): Payer: BLUE CROSS/BLUE SHIELD | Admitting: Hematology and Oncology

## 2019-02-18 ENCOUNTER — Inpatient Hospital Stay: Payer: BLUE CROSS/BLUE SHIELD | Attending: Hematology and Oncology

## 2019-02-18 ENCOUNTER — Inpatient Hospital Stay: Payer: BLUE CROSS/BLUE SHIELD

## 2019-02-18 ENCOUNTER — Other Ambulatory Visit: Payer: Self-pay

## 2019-02-18 DIAGNOSIS — Z170 Estrogen receptor positive status [ER+]: Secondary | ICD-10-CM | POA: Insufficient documentation

## 2019-02-18 DIAGNOSIS — Z923 Personal history of irradiation: Secondary | ICD-10-CM | POA: Insufficient documentation

## 2019-02-18 DIAGNOSIS — Z5112 Encounter for antineoplastic immunotherapy: Secondary | ICD-10-CM | POA: Diagnosis present

## 2019-02-18 DIAGNOSIS — C50411 Malignant neoplasm of upper-outer quadrant of right female breast: Secondary | ICD-10-CM

## 2019-02-18 DIAGNOSIS — Z79899 Other long term (current) drug therapy: Secondary | ICD-10-CM | POA: Diagnosis not present

## 2019-02-18 DIAGNOSIS — Z95828 Presence of other vascular implants and grafts: Secondary | ICD-10-CM

## 2019-02-18 LAB — CMP (CANCER CENTER ONLY)
ALT: 35 U/L (ref 0–44)
AST: 24 U/L (ref 15–41)
Albumin: 3.9 g/dL (ref 3.5–5.0)
Alkaline Phosphatase: 75 U/L (ref 38–126)
Anion gap: 10 (ref 5–15)
BUN: 16 mg/dL (ref 6–20)
CO2: 24 mmol/L (ref 22–32)
Calcium: 8.9 mg/dL (ref 8.9–10.3)
Chloride: 107 mmol/L (ref 98–111)
Creatinine: 0.69 mg/dL (ref 0.44–1.00)
GFR, Est AFR Am: >60 mL/min (ref >60)
GFR, Estimated: >60 mL/min (ref >60)
Glucose, Bld: 84 mg/dL (ref 70–99)
Potassium: 4.1 mmol/L (ref 3.5–5.1)
Sodium: 141 mmol/L (ref 135–145)
Total Bilirubin: 0.5 mg/dL (ref 0.3–1.2)
Total Protein: 6.6 g/dL (ref 6.5–8.1)

## 2019-02-18 LAB — CBC WITH DIFFERENTIAL (CANCER CENTER ONLY)
Abs Immature Granulocytes: 0.01 10*3/uL (ref 0.00–0.07)
Basophils Absolute: 0 10*3/uL (ref 0.0–0.1)
Basophils Relative: 1 %
Eosinophils Absolute: 0.1 10*3/uL (ref 0.0–0.5)
Eosinophils Relative: 4 %
HCT: 37.8 % (ref 36.0–46.0)
Hemoglobin: 12.1 g/dL (ref 12.0–15.0)
Immature Granulocytes: 0 %
Lymphocytes Relative: 25 %
Lymphs Abs: 0.8 10*3/uL (ref 0.7–4.0)
MCH: 32.9 pg (ref 26.0–34.0)
MCHC: 32 g/dL (ref 30.0–36.0)
MCV: 102.7 fL — ABNORMAL HIGH (ref 80.0–100.0)
Monocytes Absolute: 0.3 10*3/uL (ref 0.1–1.0)
Monocytes Relative: 10 %
Neutro Abs: 2 10*3/uL (ref 1.7–7.7)
Neutrophils Relative %: 60 %
Platelet Count: 191 10*3/uL (ref 150–400)
RBC: 3.68 MIL/uL — ABNORMAL LOW (ref 3.87–5.11)
RDW: 12.6 % (ref 11.5–15.5)
WBC Count: 3.3 10*3/uL — ABNORMAL LOW (ref 4.0–10.5)
nRBC: 0 % (ref 0.0–0.2)

## 2019-02-18 MED ORDER — DIPHENHYDRAMINE HCL 25 MG PO CAPS
50.0000 mg | ORAL_CAPSULE | Freq: Once | ORAL | Status: AC
  Administered 2019-02-18: 50 mg via ORAL

## 2019-02-18 MED ORDER — SODIUM CHLORIDE 0.9% FLUSH
10.0000 mL | INTRAVENOUS | Status: DC | PRN
  Administered 2019-02-18: 11:00:00 10 mL
  Filled 2019-02-18: qty 10

## 2019-02-18 MED ORDER — SODIUM CHLORIDE 0.9 % IV SOLN
Freq: Once | INTRAVENOUS | Status: AC
  Administered 2019-02-18: 09:00:00 via INTRAVENOUS
  Filled 2019-02-18: qty 250

## 2019-02-18 MED ORDER — ACETAMINOPHEN 325 MG PO TABS
ORAL_TABLET | ORAL | Status: AC
  Filled 2019-02-18: qty 2

## 2019-02-18 MED ORDER — SODIUM CHLORIDE 0.9% FLUSH
10.0000 mL | Freq: Once | INTRAVENOUS | Status: AC
  Administered 2019-02-18: 08:00:00 10 mL
  Filled 2019-02-18: qty 10

## 2019-02-18 MED ORDER — SODIUM CHLORIDE 0.9 % IV SOLN
420.0000 mg | Freq: Once | INTRAVENOUS | Status: AC
  Administered 2019-02-18: 420 mg via INTRAVENOUS
  Filled 2019-02-18: qty 14

## 2019-02-18 MED ORDER — TRASTUZUMAB CHEMO 150 MG IV SOLR
6.0000 mg/kg | Freq: Once | INTRAVENOUS | Status: AC
  Administered 2019-02-18: 378 mg via INTRAVENOUS
  Filled 2019-02-18: qty 18

## 2019-02-18 MED ORDER — ACETAMINOPHEN 325 MG PO TABS
650.0000 mg | ORAL_TABLET | Freq: Once | ORAL | Status: AC
  Administered 2019-02-18: 650 mg via ORAL

## 2019-02-18 MED ORDER — HEPARIN SOD (PORK) LOCK FLUSH 100 UNIT/ML IV SOLN
500.0000 [IU] | Freq: Once | INTRAVENOUS | Status: AC | PRN
  Administered 2019-02-18: 500 [IU]
  Filled 2019-02-18: qty 5

## 2019-02-18 MED ORDER — DIPHENHYDRAMINE HCL 25 MG PO CAPS
ORAL_CAPSULE | ORAL | Status: AC
  Filled 2019-02-18: qty 2

## 2019-02-18 NOTE — Patient Instructions (Signed)

## 2019-02-18 NOTE — Patient Instructions (Signed)
[facility] Discharge Instructions for Patients Receiving Chemotherapy  Today you received the following chemotherapy agents: Trastuzumab, Pertuzumab  To help prevent nausea and vomiting after your treatment, we encourage you to take your nausea medication as directed.   If you develop nausea and vomiting that is not controlled by your nausea medication, call the clinic.   BELOW ARE SYMPTOMS THAT SHOULD BE REPORTED IMMEDIATELY:  *FEVER GREATER THAN 100.5 F  *CHILLS WITH OR WITHOUT FEVER  NAUSEA AND VOMITING THAT IS NOT CONTROLLED WITH YOUR NAUSEA MEDICATION  *UNUSUAL SHORTNESS OF BREATH  *UNUSUAL BRUISING OR BLEEDING  TENDERNESS IN MOUTH AND THROAT WITH OR WITHOUT PRESENCE OF ULCERS  *URINARY PROBLEMS  *BOWEL PROBLEMS  UNUSUAL RASH Items with * indicate a potential emergency and should be followed up as soon as possible.  Feel free to call the clinic should you have any questions or concerns. The clinic phone number is [phone].  Please show the CHEMO ALERT CARD at check-in to the Emergency Department and triage nurse.   

## 2019-02-18 NOTE — Assessment & Plan Note (Signed)
08/20/2018:Right lumpectomy: Grade 2 IDC with DCIS, 2.2 cm, margins negative, 1/1 lymph node positive with focal extranodal extension, ER 50%, PR 50%, HER-2 positive, 3+, Ki-67 10 to 15%, T2N1  Recommendation: 1.Adjuvant chemotherapy with TCH Perjeta completed 01/07/2019 followed by Herceptin Perjeta maintenance 2.Adjuvant radiation at [facility] 3.Adjuvant antiestrogen therapy with tamoxifen x10 years 4.Adjuvant neratinib ---------------------------------------------------------------------------------------------------------------------------------------- Current treatment: Herceptin and Perjeta maintenance Echocardiogram 07/17/2018: EF 60 to 65%  Toxicities: 1.Fatigue 2.Mild diarrhea: Well responding to Imodium.Occasionally she gets constipation now. 3.  Deconditioning and chemotherapy-induced anemia: Being monitored  10/22/2018: COVID negative Once radiation is complete then we will start antiestrogen therapy Return to clinic every 3 weeks for Herceptin Perjeta and every 6 weeks for follow-up with me.

## 2019-02-19 ENCOUNTER — Telehealth: Payer: Self-pay | Admitting: Hematology and Oncology

## 2019-02-19 NOTE — Telephone Encounter (Signed)
I talk with patient regarding schedule  

## 2019-02-24 ENCOUNTER — Encounter: Payer: Self-pay | Admitting: Hematology and Oncology

## 2019-02-24 MED ORDER — LATISSE 0.03 % EX SOLN: 1.0000 "application " | Freq: Every day | CUTANEOUS | 6 refills | Status: DC

## 2019-02-25 ENCOUNTER — Ambulatory Visit (HOSPITAL_COMMUNITY)
Admission: RE | Admit: 2019-02-25 | Discharge: 2019-02-25 | Disposition: A | Discharge status: Home / Self Care | Payer: BLUE CROSS/BLUE SHIELD | Source: Ambulatory Visit | Attending: Hematology and Oncology | Admitting: Hematology and Oncology

## 2019-02-25 ENCOUNTER — Other Ambulatory Visit: Payer: Self-pay

## 2019-02-25 DIAGNOSIS — Z170 Estrogen receptor positive status [ER+]: Secondary | ICD-10-CM | POA: Insufficient documentation

## 2019-02-25 DIAGNOSIS — C50411 Malignant neoplasm of upper-outer quadrant of right female breast: Secondary | ICD-10-CM | POA: Insufficient documentation

## 2019-02-25 NOTE — Progress Notes (Signed)
  Echocardiogram 2D Echocardiogram has been performed.  Jennette Dubin 02/25/2019, 10:43 AM

## 2019-03-11 ENCOUNTER — Other Ambulatory Visit: Payer: Self-pay

## 2019-03-11 ENCOUNTER — Inpatient Hospital Stay: Payer: BLUE CROSS/BLUE SHIELD | Attending: Hematology and Oncology

## 2019-03-11 VITALS — BP 128/77 | HR 92 | Temp 99.1°F | Resp 18 | Wt 136.0 lb

## 2019-03-11 DIAGNOSIS — Z90710 Acquired absence of both cervix and uterus: Secondary | ICD-10-CM | POA: Diagnosis not present

## 2019-03-11 DIAGNOSIS — Z923 Personal history of irradiation: Secondary | ICD-10-CM | POA: Insufficient documentation

## 2019-03-11 DIAGNOSIS — C50411 Malignant neoplasm of upper-outer quadrant of right female breast: Secondary | ICD-10-CM | POA: Diagnosis present

## 2019-03-11 DIAGNOSIS — Z79899 Other long term (current) drug therapy: Secondary | ICD-10-CM | POA: Diagnosis not present

## 2019-03-11 DIAGNOSIS — Z79810 Long term (current) use of selective estrogen receptor modulators (SERMs): Secondary | ICD-10-CM | POA: Insufficient documentation

## 2019-03-11 DIAGNOSIS — Z5112 Encounter for antineoplastic immunotherapy: Secondary | ICD-10-CM | POA: Diagnosis present

## 2019-03-11 DIAGNOSIS — Z170 Estrogen receptor positive status [ER+]: Secondary | ICD-10-CM | POA: Insufficient documentation

## 2019-03-11 MED ORDER — TRASTUZUMAB CHEMO 150 MG IV SOLR
6.0000 mg/kg | Freq: Once | INTRAVENOUS | Status: AC
  Administered 2019-03-11: 378 mg via INTRAVENOUS
  Filled 2019-03-11: qty 18

## 2019-03-11 MED ORDER — ACETAMINOPHEN 325 MG PO TABS
ORAL_TABLET | ORAL | Status: AC
  Filled 2019-03-11: qty 2

## 2019-03-11 MED ORDER — DIPHENHYDRAMINE HCL 25 MG PO CAPS
50.0000 mg | ORAL_CAPSULE | Freq: Once | ORAL | Status: AC
  Administered 2019-03-11: 50 mg via ORAL

## 2019-03-11 MED ORDER — DIPHENHYDRAMINE HCL 25 MG PO CAPS
ORAL_CAPSULE | ORAL | Status: AC
  Filled 2019-03-11: qty 2

## 2019-03-11 MED ORDER — SODIUM CHLORIDE 0.9 % IV SOLN
420.0000 mg | Freq: Once | INTRAVENOUS | Status: AC
  Administered 2019-03-11: 420 mg via INTRAVENOUS
  Filled 2019-03-11: qty 14

## 2019-03-11 MED ORDER — SODIUM CHLORIDE 0.9% FLUSH
10.0000 mL | INTRAVENOUS | Status: DC | PRN
  Administered 2019-03-11: 10 mL
  Filled 2019-03-11: qty 10

## 2019-03-11 MED ORDER — SODIUM CHLORIDE 0.9 % IV SOLN
Freq: Once | INTRAVENOUS | Status: AC
  Administered 2019-03-11: 09:00:00 via INTRAVENOUS
  Filled 2019-03-11: qty 250

## 2019-03-11 MED ORDER — ACETAMINOPHEN 325 MG PO TABS
650.0000 mg | ORAL_TABLET | Freq: Once | ORAL | Status: AC
  Administered 2019-03-11: 650 mg via ORAL

## 2019-03-11 MED ORDER — HEPARIN SOD (PORK) LOCK FLUSH 100 UNIT/ML IV SOLN
500.0000 [IU] | Freq: Once | INTRAVENOUS | Status: AC | PRN
  Administered 2019-03-11: 500 [IU]
  Filled 2019-03-11: qty 5

## 2019-03-11 NOTE — Patient Instructions (Signed)
[facility] Discharge Instructions for Patients Receiving Chemotherapy  Today you received the following chemotherapy agents Trastuzumab (HERCEPTIN) & Pertuzumab (PERJETA).  To help prevent nausea and vomiting after your treatment, we encourage you to take your nausea medication as prescribed.   If you develop nausea and vomiting that is not controlled by your nausea medication, call the clinic.   BELOW ARE SYMPTOMS THAT SHOULD BE REPORTED IMMEDIATELY:  *FEVER GREATER THAN 100.5 F  *CHILLS WITH OR WITHOUT FEVER  NAUSEA AND VOMITING THAT IS NOT CONTROLLED WITH YOUR NAUSEA MEDICATION  *UNUSUAL SHORTNESS OF BREATH  *UNUSUAL BRUISING OR BLEEDING  TENDERNESS IN MOUTH AND THROAT WITH OR WITHOUT PRESENCE OF ULCERS  *URINARY PROBLEMS  *BOWEL PROBLEMS  UNUSUAL RASH Items with * indicate a potential emergency and should be followed up as soon as possible.  Feel free to call the clinic should you have any questions or concerns. The clinic phone number is [phone].  Please show the [place] at check-in to the Emergency Department and triage nurse.  Coronavirus (COVID-19) Are you at risk?  Are you at risk for the Coronavirus (COVID-19)?  To be considered HIGH RISK for Coronavirus (COVID-19), you have to meet the following criteria:  . Traveled to [place], [place], [place], [place] or [place]; or in the [place] to [place], [place], [place], or [place]; and have fever, cough, and shortness of breath within the last 2 weeks of travel OR . Been in close contact with a person diagnosed with COVID-19 within the last 2 weeks and have fever, cough, and shortness of breath . IF YOU DO NOT MEET THESE CRITERIA, YOU ARE CONSIDERED LOW RISK FOR COVID-19.  What to do if you are HIGH RISK for COVID-19?  Marland Kitchen If you are having a medical emergency, call 911. . Seek medical care right away. Before you go to a doctor's office, urgent care or emergency department,  call ahead and tell them about your recent travel, contact with someone diagnosed with COVID-19, and your symptoms. You should receive instructions from your physician's office regarding next steps of care.  . When you arrive at healthcare provider, tell the healthcare staff immediately you have returned from visiting [place], [place], [place], [place] or [place]; or traveled in the [place] to [place], [place], [place], or [place]; in the last two weeks or you have been in close contact with a person diagnosed with COVID-19 in the last 2 weeks.   . Tell the health care staff about your symptoms: fever, cough and shortness of breath. . After you have been seen by a medical provider, you will be either: o Tested for (COVID-19) and discharged home on quarantine except to seek medical care if symptoms worsen, and asked to  - Stay home and avoid contact with others until you get your results (4-5 days)  - Avoid travel on public transportation if possible (such as bus, train, or airplane) or o Sent to the Emergency Department by EMS for evaluation, COVID-19 testing, and possible admission depending on your condition and test results.  What to do if you are LOW RISK for COVID-19?  Reduce your risk of any infection by using the same precautions used for avoiding the common cold or flu:  Marland Kitchen Wash your hands often with soap and warm water for at least 20 seconds.  If soap and water are not readily available, use an alcohol-based hand sanitizer with at least 60% alcohol.  Marland Kitchen  If coughing or sneezing, cover your mouth and nose by coughing or sneezing into the elbow areas of your shirt or coat, into a tissue or into your sleeve (not your hands). . Avoid shaking hands with others and consider head nods or verbal greetings only. . Avoid touching your eyes, nose, or mouth with unwashed hands.  . Avoid close contact with people who are sick. . Avoid places or events with large numbers of people in one  location, like concerts or sporting events. . Carefully consider travel plans you have or are making. . If you are planning any travel outside or inside the [place], visit the CDC's Travelers' Health webpage for the latest health notices. . If you have some symptoms but not all symptoms, continue to monitor at home and seek medical attention if your symptoms worsen. . If you are having a medical emergency, call 911.   ADDITIONAL HEALTHCARE OPTIONS FOR PATIENTS  [facility] / e-Visit: [email or web address]         [facility]: [phone]  [facility]: [phone]                   [facility]: [phone]   

## 2019-04-01 ENCOUNTER — Inpatient Hospital Stay: Payer: BLUE CROSS/BLUE SHIELD

## 2019-04-01 ENCOUNTER — Other Ambulatory Visit: Payer: Self-pay

## 2019-04-01 ENCOUNTER — Ambulatory Visit: Payer: BLUE CROSS/BLUE SHIELD

## 2019-04-01 ENCOUNTER — Inpatient Hospital Stay (HOSPITAL_BASED_OUTPATIENT_CLINIC_OR_DEPARTMENT_OTHER): Payer: BLUE CROSS/BLUE SHIELD | Admitting: Adult Health

## 2019-04-01 ENCOUNTER — Encounter: Payer: Self-pay | Admitting: Adult Health

## 2019-04-01 VITALS — BP 133/87 | HR 96 | Temp 98.2°F | Resp 18 | Ht 65.0 in | Wt 129.3 lb

## 2019-04-01 DIAGNOSIS — Z170 Estrogen receptor positive status [ER+]: Secondary | ICD-10-CM | POA: Diagnosis not present

## 2019-04-01 DIAGNOSIS — C50411 Malignant neoplasm of upper-outer quadrant of right female breast: Secondary | ICD-10-CM | POA: Diagnosis not present

## 2019-04-01 LAB — CBC WITH DIFFERENTIAL (CANCER CENTER ONLY)
Abs Immature Granulocytes: 0.01 10*3/uL (ref 0.00–0.07)
Basophils Absolute: 0 10*3/uL (ref 0.0–0.1)
Basophils Relative: 0 %
Eosinophils Absolute: 0.1 10*3/uL (ref 0.0–0.5)
Eosinophils Relative: 2 %
HCT: 42.9 % (ref 36.0–46.0)
Hemoglobin: 14.6 g/dL (ref 12.0–15.0)
Immature Granulocytes: 0 %
Lymphocytes Relative: 13 %
Lymphs Abs: 0.7 10*3/uL (ref 0.7–4.0)
MCH: 32.4 pg (ref 26.0–34.0)
MCHC: 34 g/dL (ref 30.0–36.0)
MCV: 95.3 fL (ref 80.0–100.0)
Monocytes Absolute: 0.4 10*3/uL (ref 0.1–1.0)
Monocytes Relative: 7 %
Neutro Abs: 4 10*3/uL (ref 1.7–7.7)
Neutrophils Relative %: 78 %
Platelet Count: 202 10*3/uL (ref 150–400)
RBC: 4.5 MIL/uL (ref 3.87–5.11)
RDW: 11.5 % (ref 11.5–15.5)
WBC Count: 5.1 10*3/uL (ref 4.0–10.5)
nRBC: 0 % (ref 0.0–0.2)

## 2019-04-01 LAB — CMP (CANCER CENTER ONLY)
ALT: 24 U/L (ref 0–44)
AST: 22 U/L (ref 15–41)
Albumin: 4.4 g/dL (ref 3.5–5.0)
Alkaline Phosphatase: 87 U/L (ref 38–126)
Anion gap: 12 (ref 5–15)
BUN: 15 mg/dL (ref 6–20)
CO2: 24 mmol/L (ref 22–32)
Calcium: 9.1 mg/dL (ref 8.9–10.3)
Chloride: 104 mmol/L (ref 98–111)
Creatinine: 0.76 mg/dL (ref 0.44–1.00)
GFR, Est AFR Am: >60 mL/min (ref >60)
GFR, Estimated: >60 mL/min (ref >60)
Glucose, Bld: 94 mg/dL (ref 70–99)
Potassium: 3.6 mmol/L (ref 3.5–5.1)
Sodium: 140 mmol/L (ref 135–145)
Total Bilirubin: 0.7 mg/dL (ref 0.3–1.2)
Total Protein: 7.5 g/dL (ref 6.5–8.1)

## 2019-04-01 MED ORDER — ACETAMINOPHEN 325 MG PO TABS
ORAL_TABLET | ORAL | Status: AC
  Filled 2019-04-01: qty 2

## 2019-04-01 MED ORDER — DIPHENHYDRAMINE HCL 25 MG PO CAPS
50.0000 mg | ORAL_CAPSULE | Freq: Once | ORAL | Status: AC
  Administered 2019-04-01: 50 mg via ORAL

## 2019-04-01 MED ORDER — SODIUM CHLORIDE 0.9 % IV SOLN
Freq: Once | INTRAVENOUS | Status: AC
  Filled 2019-04-01: qty 250

## 2019-04-01 MED ORDER — DIPHENOXYLATE-ATROPINE 2.5-0.025 MG PO TABS: 1.0000 | ORAL_TABLET | Freq: Four times a day (QID) | ORAL | 0 refills | Status: DC | PRN

## 2019-04-01 MED ORDER — DIPHENHYDRAMINE HCL 25 MG PO CAPS
ORAL_CAPSULE | ORAL | Status: AC
  Filled 2019-04-01: qty 2

## 2019-04-01 MED ORDER — ACETAMINOPHEN 325 MG PO TABS
650.0000 mg | ORAL_TABLET | Freq: Once | ORAL | Status: AC
  Administered 2019-04-01: 650 mg via ORAL

## 2019-04-01 MED ORDER — HEPARIN SOD (PORK) LOCK FLUSH 100 UNIT/ML IV SOLN
500.0000 [IU] | Freq: Once | INTRAVENOUS | Status: AC | PRN
  Administered 2019-04-01: 500 [IU]
  Filled 2019-04-01: qty 5

## 2019-04-01 MED ORDER — SODIUM CHLORIDE 0.9% FLUSH
10.0000 mL | INTRAVENOUS | Status: DC | PRN
  Administered 2019-04-01: 10 mL
  Filled 2019-04-01: qty 10

## 2019-04-01 MED ORDER — TAMOXIFEN CITRATE 10 MG PO TABS: 10.0000 mg | ORAL_TABLET | Freq: Two times a day (BID) | ORAL | 3 refills | Status: DC

## 2019-04-01 MED ORDER — DIPHENHYDRAMINE HCL 25 MG PO CAPS
ORAL_CAPSULE | ORAL | Status: AC
  Filled 2019-04-01: qty 1

## 2019-04-01 MED ORDER — SODIUM CHLORIDE 0.9 % IV SOLN
420.0000 mg | Freq: Once | INTRAVENOUS | Status: AC
  Administered 2019-04-01: 420 mg via INTRAVENOUS
  Filled 2019-04-01: qty 14

## 2019-04-01 MED ORDER — TRASTUZUMAB CHEMO 150 MG IV SOLR
6.0000 mg/kg | Freq: Once | INTRAVENOUS | Status: AC
  Administered 2019-04-01: 378 mg via INTRAVENOUS
  Filled 2019-04-01: qty 18

## 2019-04-01 NOTE — Progress Notes (Signed)
[facility] Follow up:    Megan Bicker, MD No address on file   DIAGNOSIS: Cancer Staging Malignant neoplasm of upper-outer quadrant of right breast in female, estrogen receptor positive ([place]) Staging form: Breast, AJCC 8th Edition - Clinical stage from 07/23/2018: Stage IA (cT1c, cN0, cM0, G2, ER+, PR+, HER2+) - Signed by Nicholas Lose, MD on 07/23/2018 - Pathologic: Stage IB (pT2, pN1a, cM0, G2, ER+, PR+, HER2+) - Signed by Gardenia Phlegm, NP on 09/03/2018   SUMMARY OF ONCOLOGIC HISTORY: Oncology History  Malignant neoplasm of upper-outer quadrant of right breast in female, estrogen receptor positive ([place])  06/26/2018 Initial Diagnosis   [place]: Screening mammogram detected focal asymmetry in the right breast, ultrasound revealed 2 cm oval mass: Biopsy revealed grade 1-2 invasive ductal carcinoma, ER 3+, PR 3+, HER-2 3+ positive T1c M0 stage Ia clinical stage   07/23/2018 Cancer Staging   Staging form: Breast, AJCC 8th Edition - Clinical stage from 07/23/2018: Stage IA (cT1c, cN0, cM0, G2, ER+, PR+, HER2+) - Signed by Nicholas Lose, MD on 07/23/2018   08/20/2018 Surgery   Right lumpectomy: Grade 2 IDC with DCIS, 2.2 cm, margins negative, 1/1 lymph node positive with focal extranodal extension, ER 50%, PR 50%, HER-2 positive, 3+, Ki-67 10 to 15%, T2N1   09/03/2018 Cancer Staging   Staging form: Breast, AJCC 8th Edition - Pathologic: Stage IB (pT2, pN1a, cM0, G2, ER+, PR+, HER2+) - Signed by Gardenia Phlegm, NP on 09/03/2018   09/23/2018 -  Chemotherapy   Adjuvant chemo with [facility] Perjeta     CURRENT THERAPY: Herceptin/Perjeta  INTERVAL HISTORY: Megan Acevedo 48 y.o. female returns for evaluation prior to Herceptin/Perjeta.  She completed radiation on 12/14.  She has had some diarrhea, and decreased appetite.  She has been taking Imodium.  Imodium has slowed her loose stools some.  She has also started a probiotic.  She has had no  recent antibiotics.     Patient Active Problem List   Diagnosis Date Noted  . Port-A-Cath in place 10/15/2018  . Malignant neoplasm of upper-outer quadrant of right breast in female, estrogen receptor positive ([place]) 07/23/2018    has No Known Allergies.  MEDICAL HISTORY: Past Medical History:  Diagnosis Date  . Cancer [facility])     SURGICAL HISTORY: Past Surgical History:  Procedure Laterality Date  . ABDOMINAL HYSTERECTOMY    . BREAST LUMPECTOMY WITH RADIOACTIVE SEED AND SENTINEL LYMPH NODE BIOPSY Right 08/20/2018   Procedure: RIGHT BREAST LUMPECTOMY WITH RADIOACTIVE SEED AND RIGHT AXILLARY SENTINEL LYMPH NODE BIOPSY;  Surgeon: [name], MD;  Location: [facility];  Service: General;  Laterality: Right;  . CHOLECYSTECTOMY    . PORTACATH PLACEMENT Right 08/20/2018   Procedure: INSERTION PORT-A-CATH WITH ULTRASOUND;  Surgeon: [name], MD;  Location: [facility];  Service: General;  Laterality: Right;  . TUBAL LIGATION      SOCIAL HISTORY: Social History   Socioeconomic History  . Marital status: Married    Spouse name: Not on file  . Number of children: Not on file  . Years of education: Not on file  . Highest education level: Not on file  Occupational History  . Not on file  Tobacco Use  . Smoking status: Never Smoker  . Smokeless tobacco: Never Used  Substance and Sexual Activity  . Alcohol use: Never  . Drug use: Never  . Sexual activity: Not on file  Other Topics Concern  . Not on file  Social History Narrative  . Not on file   Social Determinants of Health   Financial Resource Strain:   . Difficulty of Paying Living Expenses: Not on file  Food Insecurity:   . Worried About Charity fundraiser in the Last Year: Not on file  . Ran Out of Food in the Last Year: Not on file  Transportation Needs:   . Lack of Transportation (Medical): Not on file  . Lack of Transportation (Non-Medical): Not on file  Physical  Activity:   . Days of Exercise per Week: Not on file  . Minutes of Exercise per Session: Not on file  Stress:   . Feeling of Stress : Not on file  Social Connections:   . Frequency of Communication with Friends and Family: Not on file  . Frequency of Social Gatherings with Friends and Family: Not on file  . Attends Religious Services: Not on file  . Active Member of Clubs or Organizations: Not on file  . Attends Archivist Meetings: Not on file  . Marital Status: Not on file  Intimate Partner Violence:   . Fear of Current or Ex-Partner: Not on file  . Emotionally Abused: Not on file  . Physically Abused: Not on file  . Sexually Abused: Not on file    FAMILY HISTORY: History reviewed. No pertinent family history.  Review of Systems  Constitutional: Positive for appetite change. Negative for chills, fatigue, fever and unexpected weight change.  HENT:   Negative for hearing loss and lump/mass.   Eyes: Negative for eye problems and icterus.  Respiratory: Negative for chest tightness, cough and shortness of breath.   Cardiovascular: Negative for chest pain, leg swelling and palpitations.  Gastrointestinal: Positive for diarrhea. Negative for abdominal distention, abdominal pain, blood in stool, constipation, nausea and vomiting.  Endocrine: Negative for hot flashes.  Genitourinary: Negative for difficulty urinating.   Musculoskeletal: Negative for arthralgias.  Skin: Negative for itching and rash.  Neurological: Negative for dizziness, extremity weakness, headaches and numbness.  Hematological: Negative for adenopathy. Does not bruise/bleed easily.  Psychiatric/Behavioral: Negative for depression. The patient is not nervous/anxious.       PHYSICAL EXAMINATION  ECOG PERFORMANCE STATUS: 1 - Symptomatic but completely ambulatory  Vitals:   04/01/19 1001  BP: 133/87  Pulse: 96  Resp: 18  Temp: 98.2 F (36.8 C)  SpO2: 98%    Physical Exam Constitutional:       General: She is not in acute distress.    Appearance: Normal appearance.  Eyes:     Pupils: Pupils are equal, round, and reactive to light.  Cardiovascular:     Rate and Rhythm: Normal rate and regular rhythm.     Pulses: Normal pulses.     Heart sounds: Normal heart sounds.  Pulmonary:     Effort: Pulmonary effort is normal.     Breath sounds: Normal breath sounds.     Comments: Right breast s/p lumpectomy and radiation, healing quite well from radiation, no desquamation noted.  Inspected only  Abdominal:     General: Abdomen is flat. There is no distension.     Palpations: Abdomen is soft.     Tenderness: There is no abdominal tenderness.  Musculoskeletal:        General: No swelling.     Cervical back: Neck supple.  Lymphadenopathy:     Cervical: No cervical adenopathy.  Skin:    General: Skin is warm.     Capillary Refill: Capillary refill takes less  than 2 seconds.     Findings: No rash.  Neurological:     General: No focal deficit present.     Mental Status: She is alert and oriented to person, place, and time.  Psychiatric:        Mood and Affect: Mood normal.        Behavior: Behavior normal.     LABORATORY DATA:  CBC    Component Value Date/Time   WBC 5.1 04/01/2019 0928   RBC 4.50 04/01/2019 0928   HGB 14.6 04/01/2019 0928   HCT 42.9 04/01/2019 0928   PLT 202 04/01/2019 0928   MCV 95.3 04/01/2019 0928   MCH 32.4 04/01/2019 0928   MCHC 34.0 04/01/2019 0928   RDW 11.5 04/01/2019 0928   LYMPHSABS 0.7 04/01/2019 0928   MONOABS 0.4 04/01/2019 0928   EOSABS 0.1 04/01/2019 0928   BASOSABS 0.0 04/01/2019 0928    CMP     Component Value Date/Time   NA 140 04/01/2019 0928   K 3.6 04/01/2019 0928   CL 104 04/01/2019 0928   CO2 24 04/01/2019 0928   GLUCOSE 94 04/01/2019 0928   BUN 15 04/01/2019 0928   CREATININE 0.76 04/01/2019 0928   CALCIUM 9.1 04/01/2019 0928   PROT 7.5 04/01/2019 0928   ALBUMIN 4.4 04/01/2019 0928   AST 22 04/01/2019 0928   ALT 24  04/01/2019 0928   ALKPHOS 87 04/01/2019 0928   BILITOT 0.7 04/01/2019 0928   GFRNONAA >60 04/01/2019 0928   GFRAA >60 04/01/2019 0928     ASSESSMENT and THERAPY PLAN:   Malignant neoplasm of upper-outer quadrant of right breast in female, estrogen receptor positive ([place]) 08/20/2018:Right lumpectomy: Grade 2 IDC with DCIS, 2.2 cm, margins negative, 1/1 lymph node positive with focal extranodal extension, ER 50%, PR 50%, HER-2 positive, 3+, Ki-67 10 to 15%, T2N1  Recommendation: 1.Adjuvant chemotherapy with [facility] Perjeta completed 01/07/2019 followed by Herceptin Perjeta maintenance 2.Adjuvant radiation at [facility] completed 03/23/2019 3.Adjuvant antiestrogen therapy with tamoxifen x10 years 4.Adjuvant neratinib ---------------------------------------------------------------------------------------------------------------------------------------- Current treatment: Herceptin and Perjeta maintenance Echocardiogram 02/25/2019: EF 60 to 65%  Toxicities: 1.Diarrhea: taking imodium, added Lomotil to alternate with imodium, counseled on hydration 2. Appetite decrease: recommended small frequent bland meals for now  I reviewed anti estrogen therapy with tamoxifen in detail.  Risks benefits were discussed and she is in agreement to proceed.  She does have some diarrhea and appetite decrease, so I recommended that she wait a few weeks to start the Tamoxifen.    Return to clinic every 3 weeks for Herceptin Perjeta and every 6 weeks for follow-up with Dr. Lindi Adie or Oncology APP.     All questions were answered. The patient knows to call the clinic with any problems, questions or concerns. We can certainly see the patient much sooner if necessary.  A total of (15) minutes of face-to-face time was spent with this patient with greater than 50% of that time in counseling and care-coordination.  This note was electronically signed. Scot Dock, NP 04/01/2019

## 2019-04-01 NOTE — Progress Notes (Signed)
Nutrition Assessment   Reason for Assessment:   Referral for weight loss, poor appetite   ASSESSMENT: 48 year old female with right breast cancer.  S/p lumpectomy.  Completed radiation in [place] on 03/23/2019.  Patient current receiving herceptin/perjeta.    Met with patient during infusion.  Reports for the last few days has had decreased appetite, diarrhea and some nausea.  Reports that yesterday ate oatmeal for breakfast, few bites of chicken and dumplings and 1/2 grilled cheese for lunch and few bites of salmon sandwich for dinner.  Has tried oral nutrition supplements but does not drink them consistently.  Reports some nausea last night but did not take medications.    Nutrition Focused Physical Exam: deferred   Medications: lomotil added, imodium, zofran, compazine, prilosec, ativan   Labs: reviewed   Anthropometrics:   Height: 65 inches Weight: 129 lb 4.8 oz today UBW: 135-140 lb, Noted 143 lb on 9/30, 136 lb on 12/2 BMI: 21  5% weight loss in the last 21 days, significant  Estimated Energy Needs  Kcals: 1700-2000 Protein: 85-100 g Fluid: > 1.7 L   NUTRITION DIAGNOSIS: Inadequate oral intake related to cancer treatment related side effects (diarrhea, nausea, decreased appetite) as evidenced by 5% weight loss in the last 21 days, significant    INTERVENTION:  Discussed strategies to help with diarrhea. Diarrhea Nutrition Therapy handout provided from [facility].   Encouraged setting schedule to eat mini meal/snack q 1-2 hours Provided samples of oral nutrition supplements (orgain, ensure clear, boost soothe, Dillard [place]) Encouraged taking nausea medications Contact information provided to patient   MONITORING, EVALUATION, GOAL: Patient will consume adequate calories and protein to prevent further weight loss   Next Visit: Friday, Jan 15th during infusion  Enoch Moffa B. Zenia Resides, [place], [place] Registered Dietitian [phone] (pager)

## 2019-04-01 NOTE — Assessment & Plan Note (Addendum)
08/20/2018:Right lumpectomy: Grade 2 IDC with DCIS, 2.2 cm, margins negative, 1/1 lymph node positive with focal extranodal extension, ER 50%, PR 50%, HER-2 positive, 3+, Ki-67 10 to 15%, T2N1  Recommendation: 1.Adjuvant chemotherapy with [facility] Perjeta completed 01/07/2019 followed by Herceptin Perjeta maintenance 2.Adjuvant radiation at [facility] completed 03/23/2019 3.Adjuvant antiestrogen therapy with tamoxifen x10 years 4.Adjuvant neratinib ---------------------------------------------------------------------------------------------------------------------------------------- Current treatment: Herceptin and Perjeta maintenance Echocardiogram 02/25/2019: EF 60 to 65%  Toxicities: 1.Diarrhea: taking imodium, added Lomotil to alternate with imodium, counseled on hydration 2. Appetite decrease: recommended small frequent bland meals for now  I reviewed anti estrogen therapy with tamoxifen in detail.  Risks benefits were discussed and she is in agreement to proceed.  She does have some diarrhea and appetite decrease, so I recommended that she wait a few weeks to start the Tamoxifen.    Return to clinic every 3 weeks for Herceptin Perjeta and every 6 weeks for follow-up with Dr. Lindi Adie or Oncology APP.

## 2019-04-07 ENCOUNTER — Encounter: Payer: Self-pay | Admitting: Adult Health

## 2019-04-09 ENCOUNTER — Encounter: Payer: Self-pay | Admitting: *Deleted

## 2019-04-20 ENCOUNTER — Encounter: Payer: Self-pay | Admitting: *Deleted

## 2019-04-21 ENCOUNTER — Encounter: Payer: Self-pay | Admitting: *Deleted

## 2019-04-21 ENCOUNTER — Encounter: Payer: Self-pay | Admitting: Adult Health

## 2019-04-22 ENCOUNTER — Ambulatory Visit: Payer: BLUE CROSS/BLUE SHIELD

## 2019-04-23 ENCOUNTER — Encounter: Payer: Self-pay | Admitting: *Deleted

## 2019-04-24 ENCOUNTER — Inpatient Hospital Stay: Payer: 59 | Admitting: Nutrition

## 2019-04-24 ENCOUNTER — Inpatient Hospital Stay: Payer: 59 | Attending: Hematology and Oncology

## 2019-04-24 ENCOUNTER — Other Ambulatory Visit: Payer: Self-pay

## 2019-04-24 VITALS — BP 120/75 | HR 88 | Temp 98.8°F | Resp 18 | Wt 131.5 lb

## 2019-04-24 DIAGNOSIS — Z5112 Encounter for antineoplastic immunotherapy: Secondary | ICD-10-CM | POA: Insufficient documentation

## 2019-04-24 DIAGNOSIS — Z170 Estrogen receptor positive status [ER+]: Secondary | ICD-10-CM | POA: Diagnosis not present

## 2019-04-24 DIAGNOSIS — C50411 Malignant neoplasm of upper-outer quadrant of right female breast: Secondary | ICD-10-CM | POA: Diagnosis not present

## 2019-04-24 MED ORDER — SODIUM CHLORIDE 0.9 % IV SOLN
Freq: Once | INTRAVENOUS | Status: AC
  Filled 2019-04-24: qty 250

## 2019-04-24 MED ORDER — SODIUM CHLORIDE 0.9 % IV SOLN
420.0000 mg | Freq: Once | INTRAVENOUS | Status: AC
  Administered 2019-04-24: 420 mg via INTRAVENOUS
  Filled 2019-04-24: qty 14

## 2019-04-24 MED ORDER — DIPHENHYDRAMINE HCL 25 MG PO CAPS
50.0000 mg | ORAL_CAPSULE | Freq: Once | ORAL | Status: AC
  Administered 2019-04-24: 50 mg via ORAL

## 2019-04-24 MED ORDER — HEPARIN SOD (PORK) LOCK FLUSH 100 UNIT/ML IV SOLN
500.0000 [IU] | Freq: Once | INTRAVENOUS | Status: AC | PRN
  Administered 2019-04-24: 500 [IU]
  Filled 2019-04-24: qty 5

## 2019-04-24 MED ORDER — TRASTUZUMAB-ANNS CHEMO 150 MG IV SOLR
6.2000 mg/kg | Freq: Once | INTRAVENOUS | Status: AC
  Administered 2019-04-24: 378 mg via INTRAVENOUS
  Filled 2019-04-24: qty 18

## 2019-04-24 MED ORDER — TRASTUZUMAB CHEMO 150 MG IV SOLR: 6.0000 mg/kg | Freq: Once | INTRAVENOUS | Status: DC

## 2019-04-24 MED ORDER — SODIUM CHLORIDE 0.9% FLUSH
10.0000 mL | INTRAVENOUS | Status: DC | PRN
  Administered 2019-04-24: 10 mL
  Filled 2019-04-24: qty 10

## 2019-04-24 MED ORDER — DIPHENHYDRAMINE HCL 25 MG PO CAPS
ORAL_CAPSULE | ORAL | Status: AC
  Filled 2019-04-24: qty 2

## 2019-04-24 MED ORDER — ACETAMINOPHEN 325 MG PO TABS
ORAL_TABLET | ORAL | Status: AC
  Filled 2019-04-24: qty 2

## 2019-04-24 MED ORDER — ACETAMINOPHEN 325 MG PO TABS
650.0000 mg | ORAL_TABLET | Freq: Once | ORAL | Status: AC
  Administered 2019-04-24: 650 mg via ORAL

## 2019-04-24 NOTE — Progress Notes (Signed)
Brief nutrition follow-up completed with patient during infusion for breast cancer. Patient denies all nutritional impact symptoms. States her appetite is good.  Her diarrhea has resolved. Weight improved and was documented as 131.5 pounds January 15 up from 129 pounds December 23. Nutrition diagnosis of inadequate oral intake has resolved. Provided contact information for patient in case she has future questions concerns or nutrition impact symptoms.  **Disclaimer: This note was dictated with voice recognition software. Similar sounding words can inadvertently be transcribed and this note may contain transcription errors which may not have been corrected upon publication of note.**

## 2019-04-24 NOTE — Patient Instructions (Addendum)
[facility] Discharge Instructions for Patients Receiving Chemotherapy  Today you received the following chemotherapy agents Trastuzumab (Kanjinti) & Pertuzumab (PERJETA).  To help prevent nausea and vomiting after your treatment, we encourage you to take your nausea medication as prescribed.   If you develop nausea and vomiting that is not controlled by your nausea medication, call the clinic.   BELOW ARE SYMPTOMS THAT SHOULD BE REPORTED IMMEDIATELY:  *FEVER GREATER THAN 100.5 F  *CHILLS WITH OR WITHOUT FEVER  NAUSEA AND VOMITING THAT IS NOT CONTROLLED WITH YOUR NAUSEA MEDICATION  *UNUSUAL SHORTNESS OF BREATH  *UNUSUAL BRUISING OR BLEEDING  TENDERNESS IN MOUTH AND THROAT WITH OR WITHOUT PRESENCE OF ULCERS  *URINARY PROBLEMS  *BOWEL PROBLEMS  UNUSUAL RASH Items with * indicate a potential emergency and should be followed up as soon as possible.  Feel free to call the clinic should you have any questions or concerns. The clinic phone number is [phone].  Please show the [facility] at check-in to the Emergency Department and triage nurse.  Coronavirus (COVID-19) Are you at risk?  Are you at risk for the Coronavirus (COVID-19)?  To be considered HIGH RISK for Coronavirus (COVID-19), you have to meet the following criteria:  . Traveled to [place], [place], [place], [place] or [place]; or in the [place] to [place], [place], [place], or [place]; and have fever, cough, and shortness of breath within the last 2 weeks of travel OR . Been in close contact with a person diagnosed with COVID-19 within the last 2 weeks and have fever, cough, and shortness of breath . IF YOU DO NOT MEET THESE CRITERIA, YOU ARE CONSIDERED LOW RISK FOR COVID-19.  What to do if you are HIGH RISK for COVID-19?  Marland Kitchen If you are having a medical emergency, call 911. . Seek medical care right away. Before you go to a doctor's office, urgent care or emergency department,  call ahead and tell them about your recent travel, contact with someone diagnosed with COVID-19, and your symptoms. You should receive instructions from your physician's office regarding next steps of care.  . When you arrive at healthcare provider, tell the healthcare staff immediately you have returned from visiting [place], [place], [place], [place] or [place]; or traveled in the [place] to [place], [place], [place], or [place]; in the last two weeks or you have been in close contact with a person diagnosed with COVID-19 in the last 2 weeks.   . Tell the health care staff about your symptoms: fever, cough and shortness of breath. . After you have been seen by a medical provider, you will be either: o Tested for (COVID-19) and discharged home on quarantine except to seek medical care if symptoms worsen, and asked to  - Stay home and avoid contact with others until you get your results (4-5 days)  - Avoid travel on public transportation if possible (such as bus, train, or airplane) or o Sent to the Emergency Department by EMS for evaluation, COVID-19 testing, and possible admission depending on your condition and test results.  What to do if you are LOW RISK for COVID-19?  Reduce your risk of any infection by using the same precautions used for avoiding the common cold or flu:  Marland Kitchen Wash your hands often with soap and warm water for at least 20 seconds.  If soap and water are not readily available, use an alcohol-based hand sanitizer with at least 60% alcohol.  Marland Kitchen  If coughing or sneezing, cover your mouth and nose by coughing or sneezing into the elbow areas of your shirt or coat, into a tissue or into your sleeve (not your hands). . Avoid shaking hands with others and consider head nods or verbal greetings only. . Avoid touching your eyes, nose, or mouth with unwashed hands.  . Avoid close contact with people who are sick. . Avoid places or events with large numbers of people in one  location, like concerts or sporting events. . Carefully consider travel plans you have or are making. . If you are planning any travel outside or inside the [place], visit the CDC's Travelers' Health webpage for the latest health notices. . If you have some symptoms but not all symptoms, continue to monitor at home and seek medical attention if your symptoms worsen. . If you are having a medical emergency, call 911.   ADDITIONAL HEALTHCARE OPTIONS FOR PATIENTS  [facility] / e-Visit: [email or web address]         [facility]: [phone]  [facility]: [phone]                   [facility]: [phone]   

## 2019-05-14 ENCOUNTER — Encounter: Payer: Self-pay | Admitting: *Deleted

## 2019-05-15 ENCOUNTER — Other Ambulatory Visit: Payer: Self-pay

## 2019-05-15 ENCOUNTER — Inpatient Hospital Stay: Payer: 59

## 2019-05-15 ENCOUNTER — Inpatient Hospital Stay: Payer: 59 | Attending: Hematology and Oncology

## 2019-05-15 ENCOUNTER — Encounter: Payer: Self-pay | Admitting: Adult Health

## 2019-05-15 ENCOUNTER — Inpatient Hospital Stay (HOSPITAL_BASED_OUTPATIENT_CLINIC_OR_DEPARTMENT_OTHER): Payer: 59 | Admitting: Adult Health

## 2019-05-15 ENCOUNTER — Telehealth: Payer: Self-pay | Admitting: Adult Health

## 2019-05-15 VITALS — BP 125/81 | HR 83 | Temp 98.3°F | Resp 18 | Ht 65.0 in | Wt 135.8 lb

## 2019-05-15 DIAGNOSIS — Z170 Estrogen receptor positive status [ER+]: Secondary | ICD-10-CM

## 2019-05-15 DIAGNOSIS — C50411 Malignant neoplasm of upper-outer quadrant of right female breast: Secondary | ICD-10-CM | POA: Diagnosis not present

## 2019-05-15 DIAGNOSIS — Z95828 Presence of other vascular implants and grafts: Secondary | ICD-10-CM

## 2019-05-15 DIAGNOSIS — M2550 Pain in unspecified joint: Secondary | ICD-10-CM | POA: Diagnosis not present

## 2019-05-15 DIAGNOSIS — Z79810 Long term (current) use of selective estrogen receptor modulators (SERMs): Secondary | ICD-10-CM | POA: Diagnosis not present

## 2019-05-15 DIAGNOSIS — Z5112 Encounter for antineoplastic immunotherapy: Secondary | ICD-10-CM | POA: Insufficient documentation

## 2019-05-15 DIAGNOSIS — Z90710 Acquired absence of both cervix and uterus: Secondary | ICD-10-CM | POA: Diagnosis not present

## 2019-05-15 DIAGNOSIS — L299 Pruritus, unspecified: Secondary | ICD-10-CM | POA: Diagnosis not present

## 2019-05-15 DIAGNOSIS — Z923 Personal history of irradiation: Secondary | ICD-10-CM | POA: Diagnosis not present

## 2019-05-15 LAB — CBC WITH DIFFERENTIAL (CANCER CENTER ONLY)
Abs Immature Granulocytes: 0.01 10*3/uL (ref 0.00–0.07)
Basophils Absolute: 0 10*3/uL (ref 0.0–0.1)
Basophils Relative: 1 %
Eosinophils Absolute: 0.2 10*3/uL (ref 0.0–0.5)
Eosinophils Relative: 4 %
HCT: 36.1 % (ref 36.0–46.0)
Hemoglobin: 12.1 g/dL (ref 12.0–15.0)
Immature Granulocytes: 0 %
Lymphocytes Relative: 23 %
Lymphs Abs: 0.9 10*3/uL (ref 0.7–4.0)
MCH: 31.7 pg (ref 26.0–34.0)
MCHC: 33.5 g/dL (ref 30.0–36.0)
MCV: 94.5 fL (ref 80.0–100.0)
Monocytes Absolute: 0.3 10*3/uL (ref 0.1–1.0)
Monocytes Relative: 7 %
Neutro Abs: 2.5 10*3/uL (ref 1.7–7.7)
Neutrophils Relative %: 65 %
Platelet Count: 205 10*3/uL (ref 150–400)
RBC: 3.82 MIL/uL — ABNORMAL LOW (ref 3.87–5.11)
RDW: 12.3 % (ref 11.5–15.5)
WBC Count: 3.9 10*3/uL — ABNORMAL LOW (ref 4.0–10.5)
nRBC: 0 % (ref 0.0–0.2)

## 2019-05-15 LAB — CMP (CANCER CENTER ONLY)
ALT: 13 U/L (ref 0–44)
AST: 13 U/L — ABNORMAL LOW (ref 15–41)
Albumin: 4 g/dL (ref 3.5–5.0)
Alkaline Phosphatase: 76 U/L (ref 38–126)
Anion gap: 6 (ref 5–15)
BUN: 13 mg/dL (ref 6–20)
CO2: 26 mmol/L (ref 22–32)
Calcium: 8.8 mg/dL — ABNORMAL LOW (ref 8.9–10.3)
Chloride: 109 mmol/L (ref 98–111)
Creatinine: 0.75 mg/dL (ref 0.44–1.00)
GFR, Est AFR Am: >60 mL/min (ref >60)
GFR, Estimated: >60 mL/min (ref >60)
Glucose, Bld: 81 mg/dL (ref 70–99)
Potassium: 4.1 mmol/L (ref 3.5–5.1)
Sodium: 141 mmol/L (ref 135–145)
Total Bilirubin: 0.7 mg/dL (ref 0.3–1.2)
Total Protein: 6.8 g/dL (ref 6.5–8.1)

## 2019-05-15 MED ORDER — SODIUM CHLORIDE 0.9% FLUSH
10.0000 mL | Freq: Once | INTRAVENOUS | Status: DC
  Filled 2019-05-15: qty 10

## 2019-05-15 MED ORDER — SODIUM CHLORIDE 0.9% FLUSH
10.0000 mL | Freq: Once | INTRAVENOUS | Status: AC
  Administered 2019-05-15: 10 mL
  Filled 2019-05-15: qty 10

## 2019-05-15 MED ORDER — HEPARIN SOD (PORK) LOCK FLUSH 100 UNIT/ML IV SOLN
500.0000 [IU] | Freq: Once | INTRAVENOUS | Status: DC
  Filled 2019-05-15: qty 5

## 2019-05-15 NOTE — Progress Notes (Addendum)
Megan Acevedo:    Megan Bicker, MD No address on file   DIAGNOSIS: Cancer Staging Malignant neoplasm of upper-outer quadrant of right breast in female, estrogen receptor positive ([place]) Staging form: Breast, AJCC 8th Edition - Clinical stage from 07/23/2018: Stage IA (cT1c, cN0, cM0, G2, ER+, PR+, HER2+) - Signed by Nicholas Lose, MD on 07/23/2018 - Pathologic: Stage IB (pT2, pN1a, cM0, G2, ER+, PR+, HER2+) - Signed by Gardenia Phlegm, NP on 09/03/2018   SUMMARY OF ONCOLOGIC HISTORY: Oncology History  Malignant neoplasm of upper-outer quadrant of right breast in female, estrogen receptor positive ([place])  06/26/2018 Initial Diagnosis   [place]: Screening mammogram detected focal asymmetry in the right breast, ultrasound revealed 2 cm oval mass: Biopsy revealed grade 1-2 invasive ductal carcinoma, ER 3+, PR 3+, HER-2 3+ positive T1c M0 stage Ia clinical stage   07/23/2018 Cancer Staging   Staging form: Breast, AJCC 8th Edition - Clinical stage from 07/23/2018: Stage IA (cT1c, cN0, cM0, G2, ER+, PR+, HER2+) - Signed by Nicholas Lose, MD on 07/23/2018   08/20/2018 Surgery   Right lumpectomy: Grade 2 IDC with DCIS, 2.2 cm, margins negative, 1/1 lymph node positive with focal extranodal extension, ER 50%, PR 50%, HER-2 positive, 3+, Ki-67 10 to 15%, T2N1   09/03/2018 Cancer Staging   Staging form: Breast, AJCC 8th Edition - Pathologic: Stage IB (pT2, pN1a, cM0, G2, ER+, PR+, HER2+) - Signed by Gardenia Phlegm, NP on 09/03/2018   09/23/2018 -  Chemotherapy   Adjuvant chemo with [facility] Perjeta     CURRENT THERAPY: Tamoxifen/Herceptin  INTERVAL HISTORY: Megan Acevedo 49 y.o. female returns for evaluation prior to receiving her adjuvant treatment.  She received the biosimilar Kanjinti 3 weeks ago.  She developed subsequent itching after this.  She also is having increasing joint aches all over, worse in her hands.     Patient Active  Problem List   Diagnosis Date Noted  . Port-A-Cath in place 10/15/2018  . Malignant neoplasm of upper-outer quadrant of right breast in female, estrogen receptor positive ([place]) 07/23/2018    has No Known Allergies.  MEDICAL HISTORY: Past Medical History:  Diagnosis Date  . Cancer [facility])     SURGICAL HISTORY: Past Surgical History:  Procedure Laterality Date  . ABDOMINAL HYSTERECTOMY    . BREAST LUMPECTOMY WITH RADIOACTIVE SEED AND SENTINEL LYMPH NODE BIOPSY Right 08/20/2018   Procedure: RIGHT BREAST LUMPECTOMY WITH RADIOACTIVE SEED AND RIGHT AXILLARY SENTINEL LYMPH NODE BIOPSY;  Surgeon: [name], MD;  Location: [place];  Service: General;  Laterality: Right;  . CHOLECYSTECTOMY    . PORTACATH PLACEMENT Right 08/20/2018   Procedure: INSERTION PORT-A-CATH WITH ULTRASOUND;  Surgeon: [name], MD;  Location: [place];  Service: General;  Laterality: Right;  . TUBAL LIGATION      SOCIAL HISTORY: Social History   Socioeconomic History  . Marital status: Married    Spouse name: Not on file  . Number of children: Not on file  . Years of education: Not on file  . Highest education level: Not on file  Occupational History  . Not on file  Tobacco Use  . Smoking status: Never Smoker  . Smokeless tobacco: Never Used  Substance and Sexual Activity  . Alcohol use: Never  . Drug use: Never  . Sexual activity: Not on file  Other Topics Concern  . Not on file  Social History Narrative  . Not on file  Social Determinants of Health   Financial Resource Strain:   . Difficulty of Paying Living Expenses: Not on file  Food Insecurity:   . Worried About Charity fundraiser in the Last Year: Not on file  . Ran Out of Food in the Last Year: Not on file  Transportation Needs:   . Lack of Transportation (Medical): Not on file  . Lack of Transportation (Non-Medical): Not on file  Physical Activity:   . Days of Exercise per Week: Not  on file  . Minutes of Exercise per Session: Not on file  Stress:   . Feeling of Stress : Not on file  Social Connections:   . Frequency of Communication with Friends and Family: Not on file  . Frequency of Social Gatherings with Friends and Family: Not on file  . Attends Religious Services: Not on file  . Active Member of Clubs or Organizations: Not on file  . Attends Archivist Meetings: Not on file  . Marital Status: Not on file  Intimate Partner Violence:   . Fear of Current or Ex-Partner: Not on file  . Emotionally Abused: Not on file  . Physically Abused: Not on file  . Sexually Abused: Not on file    FAMILY HISTORY: History reviewed. No pertinent family history.  Review of Systems  Constitutional: Negative for appetite change, chills, fatigue, fever and unexpected weight change.  HENT:   Negative for hearing loss and lump/mass.   Eyes: Negative for eye problems and icterus.  Respiratory: Negative for chest tightness, cough and shortness of breath.   Cardiovascular: Negative for chest pain, leg swelling and palpitations.  Gastrointestinal: Negative for abdominal distention and abdominal pain.  Endocrine: Negative for hot flashes.  Genitourinary: Negative for difficulty urinating.   Musculoskeletal: Positive for arthralgias.  Skin: Positive for itching and rash.  Neurological: Negative for dizziness, extremity weakness, headaches and numbness.  Hematological: Negative for adenopathy. Does not bruise/bleed easily.  Psychiatric/Behavioral: Negative for depression. The patient is not nervous/anxious.       PHYSICAL EXAMINATION  ECOG PERFORMANCE STATUS: 1 - Symptomatic but completely ambulatory  Vitals:   05/15/19 0841  BP: 125/81  Pulse: 83  Resp: 18  Temp: 98.3 F (36.8 C)  SpO2: 99%    Physical Exam Constitutional:      General: She is not in acute distress.    Appearance: Normal appearance. She is not toxic-appearing.  HENT:     Head:  Normocephalic and atraumatic.  Eyes:     General: No scleral icterus. Cardiovascular:     Rate and Rhythm: Regular rhythm.     Pulses: Normal pulses.     Heart sounds: Normal heart sounds.  Pulmonary:     Effort: Pulmonary effort is normal.     Breath sounds: Normal breath sounds.  Abdominal:     General: Abdomen is flat. Bowel sounds are normal. There is no distension.     Palpations: Abdomen is soft.     Tenderness: There is no abdominal tenderness.  Musculoskeletal:        General: No swelling.     Cervical back: Neck supple.  Lymphadenopathy:     Cervical: No cervical adenopathy.  Skin:    General: Skin is warm and dry.     Capillary Refill: Capillary refill takes less than 2 seconds.     Findings: No rash.  Neurological:     General: No focal deficit present.     Mental Status: She is alert.  Psychiatric:  Mood and Affect: Mood normal.     LABORATORY DATA:  CBC    Component Value Date/Time   WBC 3.9 (L) 05/15/2019 0828   RBC 3.82 (L) 05/15/2019 0828   HGB 12.1 05/15/2019 0828   HCT 36.1 05/15/2019 0828   PLT 205 05/15/2019 0828   MCV 94.5 05/15/2019 0828   MCH 31.7 05/15/2019 0828   MCHC 33.5 05/15/2019 0828   RDW 12.3 05/15/2019 0828   LYMPHSABS 0.9 05/15/2019 0828   MONOABS 0.3 05/15/2019 0828   EOSABS 0.2 05/15/2019 0828   BASOSABS 0.0 05/15/2019 0828    CMP     Component Value Date/Time   NA 141 05/15/2019 0828   K 4.1 05/15/2019 0828   CL 109 05/15/2019 0828   CO2 26 05/15/2019 0828   GLUCOSE 81 05/15/2019 0828   BUN 13 05/15/2019 0828   CREATININE 0.75 05/15/2019 0828   CALCIUM 8.8 (L) 05/15/2019 0828   PROT 6.8 05/15/2019 0828   ALBUMIN 4.0 05/15/2019 0828   AST 13 (L) 05/15/2019 0828   ALT 13 05/15/2019 0828   ALKPHOS 76 05/15/2019 0828   BILITOT 0.7 05/15/2019 0828   GFRNONAA >60 05/15/2019 0828   GFRAA >60 05/15/2019 0828        ASSESSMENT and PLAN:   Malignant neoplasm of upper-outer quadrant of right breast in  female, estrogen receptor positive ([place]) 08/20/2018:Right lumpectomy: Grade 2 IDC with DCIS, 2.2 cm, margins negative, 1/1 lymph node positive with focal extranodal extension, ER 50%, PR 50%, HER-2 positive, 3+, Ki-67 10 to 15%, T2N1  Recommendation: 1.Adjuvant chemotherapy with [facility] Perjeta completed 01/07/2019 followed by Herceptin Perjeta maintenance 2.Adjuvant radiation at [facility] completed 03/23/2019 3.Adjuvant antiestrogen therapy with tamoxifen x10 years 4.Adjuvant neratinib ---------------------------------------------------------------------------------------------------------------------------------------- Current treatment: Herceptin and Perjeta maintenance, adjuvant Tamoxifen Echocardiogram 02/25/2019: EF 60 to 65%  1. Arthralgias: ? Related to tamoxifen.  She will decrease to 85m daily and we will see if this helps.  2. Itching: This started after she switched from Herceptin to the biosimilar [place]  To avoid future problems, or the allergic reaction worsening, we recommended that she switch back to Herceptin.  I spoke with pharmacy and her prior authorization team, and she will not be able to switch back without approval from her insurance.  We will delay her treatment by one week due to this, and will hopefully have approval next week.    I placed orders for repeat echocardiogram today.  Layton will return next week for Herceptin/Perjeta.  She was recommended to continue with the appropriate pandemic precautions. She knows to call for any questions that may arise between now and her next appointment.  We are happy to see her sooner if needed.     Orders Placed This Encounter  Procedures  . ECHOCARDIOGRAM COMPLETE    Standing Status:   Future    Standing Expiration Date:   08/11/2020    Order Specific Question:   Where should this test be performed    [facility]   Order Specific Question:   Perflutren DEFINITY (image enhancing agent) should be  administered unless hypersensitivity or allergy exist    Answer:   Administer Perflutren    Order Specific Question:   Reason for exam-Echo    Answer:   Chemotherapy evaluation  v87.41 / v58.11      This note was electronically signed. LWilber Bihari NP 05/15/2019   Attending Note  I personally saw and examined Megan Acevedo The plan of care was discussed  with her. I agree with the assessment and plan as documented above. Itching related to Kingenti: We will request insurance authorization to use Herceptin.  I discussed with her that we have had several patients have had similar issues with Kingenti. Once we get the insurance authorization will resume her treatment. Signed Harriette Ohara, MD

## 2019-05-15 NOTE — Telephone Encounter (Signed)
I left a message regarding schedule  

## 2019-05-15 NOTE — Patient Instructions (Signed)

## 2019-05-15 NOTE — Assessment & Plan Note (Addendum)
08/20/2018:Right lumpectomy: Grade 2 IDC with DCIS, 2.2 cm, margins negative, 1/1 lymph node positive with focal extranodal extension, ER 50%, PR 50%, HER-2 positive, 3+, Ki-67 10 to 15%, T2N1  Recommendation: 1.Adjuvant chemotherapy with [facility] Perjeta completed 01/07/2019 followed by Herceptin Perjeta maintenance 2.Adjuvant radiation at [facility] completed 03/23/2019 3.Adjuvant antiestrogen therapy with tamoxifen x10 years 4.Adjuvant neratinib ---------------------------------------------------------------------------------------------------------------------------------------- Current treatment: Herceptin and Perjeta maintenance, adjuvant Tamoxifen Echocardiogram 02/25/2019: EF 60 to 65%  1. Arthralgias: ? Related to tamoxifen.  She will decrease to 36m daily and we will see if this helps.  2. Itching: This started after she switched from Herceptin to the biosimilar [place]  To avoid future problems, or the allergic reaction worsening, we recommended that she switch back to Herceptin.  I spoke with pharmacy and her prior authorization team, and she will not be able to switch back without approval from her insurance.  We will delay her treatment by one week due to this, and will hopefully have approval next week.    I placed orders for repeat echocardiogram today.  Gregoria will return next week for Herceptin/Perjeta.  She was recommended to continue with the appropriate pandemic precautions. She knows to call for any questions that may arise between now and her next appointment.  We are happy to see her sooner if needed.

## 2019-05-20 ENCOUNTER — Encounter: Payer: Self-pay | Admitting: Adult Health

## 2019-05-22 ENCOUNTER — Inpatient Hospital Stay: Payer: 59

## 2019-05-22 ENCOUNTER — Other Ambulatory Visit (HOSPITAL_COMMUNITY): Payer: 59

## 2019-05-22 ENCOUNTER — Other Ambulatory Visit: Payer: Self-pay | Admitting: Oncology

## 2019-05-22 ENCOUNTER — Other Ambulatory Visit: Payer: Self-pay | Admitting: *Deleted

## 2019-05-22 ENCOUNTER — Other Ambulatory Visit: Payer: Self-pay

## 2019-05-22 VITALS — BP 135/80 | HR 77 | Temp 98.2°F | Resp 18 | Wt 132.0 lb

## 2019-05-22 DIAGNOSIS — Z923 Personal history of irradiation: Secondary | ICD-10-CM | POA: Diagnosis not present

## 2019-05-22 DIAGNOSIS — C50411 Malignant neoplasm of upper-outer quadrant of right female breast: Secondary | ICD-10-CM

## 2019-05-22 DIAGNOSIS — Z170 Estrogen receptor positive status [ER+]: Secondary | ICD-10-CM | POA: Diagnosis not present

## 2019-05-22 DIAGNOSIS — L299 Pruritus, unspecified: Secondary | ICD-10-CM | POA: Diagnosis not present

## 2019-05-22 DIAGNOSIS — Z95828 Presence of other vascular implants and grafts: Secondary | ICD-10-CM

## 2019-05-22 DIAGNOSIS — M2550 Pain in unspecified joint: Secondary | ICD-10-CM | POA: Diagnosis not present

## 2019-05-22 DIAGNOSIS — Z90710 Acquired absence of both cervix and uterus: Secondary | ICD-10-CM | POA: Diagnosis not present

## 2019-05-22 DIAGNOSIS — Z5112 Encounter for antineoplastic immunotherapy: Secondary | ICD-10-CM | POA: Diagnosis not present

## 2019-05-22 DIAGNOSIS — Z79810 Long term (current) use of selective estrogen receptor modulators (SERMs): Secondary | ICD-10-CM | POA: Diagnosis not present

## 2019-05-22 LAB — CMP (CANCER CENTER ONLY)
ALT: 13 U/L (ref 0–44)
AST: 13 U/L — ABNORMAL LOW (ref 15–41)
Albumin: 4.2 g/dL (ref 3.5–5.0)
Alkaline Phosphatase: 84 U/L (ref 38–126)
Anion gap: 9 (ref 5–15)
BUN: 15 mg/dL (ref 6–20)
CO2: 25 mmol/L (ref 22–32)
Calcium: 8.8 mg/dL — ABNORMAL LOW (ref 8.9–10.3)
Chloride: 109 mmol/L (ref 98–111)
Creatinine: 0.71 mg/dL (ref 0.44–1.00)
GFR, Est AFR Am: >60 mL/min (ref >60)
GFR, Estimated: >60 mL/min (ref >60)
Glucose, Bld: 82 mg/dL (ref 70–99)
Potassium: 4 mmol/L (ref 3.5–5.1)
Sodium: 143 mmol/L (ref 135–145)
Total Bilirubin: 0.5 mg/dL (ref 0.3–1.2)
Total Protein: 7.3 g/dL (ref 6.5–8.1)

## 2019-05-22 LAB — CBC WITH DIFFERENTIAL (CANCER CENTER ONLY)
Abs Immature Granulocytes: 0.01 10*3/uL (ref 0.00–0.07)
Basophils Absolute: 0 10*3/uL (ref 0.0–0.1)
Basophils Relative: 1 %
Eosinophils Absolute: 0.1 10*3/uL (ref 0.0–0.5)
Eosinophils Relative: 3 %
HCT: 38.5 % (ref 36.0–46.0)
Hemoglobin: 13 g/dL (ref 12.0–15.0)
Immature Granulocytes: 0 %
Lymphocytes Relative: 27 %
Lymphs Abs: 1.2 10*3/uL (ref 0.7–4.0)
MCH: 32 pg (ref 26.0–34.0)
MCHC: 33.8 g/dL (ref 30.0–36.0)
MCV: 94.8 fL (ref 80.0–100.0)
Monocytes Absolute: 0.3 10*3/uL (ref 0.1–1.0)
Monocytes Relative: 7 %
Neutro Abs: 2.6 10*3/uL (ref 1.7–7.7)
Neutrophils Relative %: 62 %
Platelet Count: 238 10*3/uL (ref 150–400)
RBC: 4.06 MIL/uL (ref 3.87–5.11)
RDW: 12.3 % (ref 11.5–15.5)
WBC Count: 4.2 10*3/uL (ref 4.0–10.5)
nRBC: 0 % (ref 0.0–0.2)

## 2019-05-22 MED ORDER — SODIUM CHLORIDE 0.9% FLUSH
10.0000 mL | INTRAVENOUS | Status: DC | PRN
  Administered 2019-05-22: 15:00:00 10 mL
  Filled 2019-05-22: qty 10

## 2019-05-22 MED ORDER — SODIUM CHLORIDE 0.9 % IV SOLN
420.0000 mg | Freq: Once | INTRAVENOUS | Status: AC
  Administered 2019-05-22: 14:00:00 420 mg via INTRAVENOUS
  Filled 2019-05-22: qty 14

## 2019-05-22 MED ORDER — ACETAMINOPHEN 325 MG PO TABS
ORAL_TABLET | ORAL | Status: AC
  Filled 2019-05-22: qty 2

## 2019-05-22 MED ORDER — DIPHENHYDRAMINE HCL 25 MG PO CAPS
ORAL_CAPSULE | ORAL | Status: AC
  Filled 2019-05-22: qty 2

## 2019-05-22 MED ORDER — DIPHENHYDRAMINE HCL 25 MG PO CAPS
50.0000 mg | ORAL_CAPSULE | Freq: Once | ORAL | Status: AC
  Administered 2019-05-22: 50 mg via ORAL

## 2019-05-22 MED ORDER — ACETAMINOPHEN 325 MG PO TABS
650.0000 mg | ORAL_TABLET | Freq: Once | ORAL | Status: AC
  Administered 2019-05-22: 650 mg via ORAL

## 2019-05-22 MED ORDER — SODIUM CHLORIDE 0.9% FLUSH
10.0000 mL | Freq: Once | INTRAVENOUS | Status: AC
  Administered 2019-05-22: 10 mL
  Filled 2019-05-22: qty 10

## 2019-05-22 MED ORDER — HEPARIN SOD (PORK) LOCK FLUSH 100 UNIT/ML IV SOLN
500.0000 [IU] | Freq: Once | INTRAVENOUS | Status: AC | PRN
  Administered 2019-05-22: 15:00:00 500 [IU]
  Filled 2019-05-22: qty 5

## 2019-05-22 MED ORDER — SODIUM CHLORIDE 0.9 % IV SOLN
Freq: Once | INTRAVENOUS | Status: AC
  Filled 2019-05-22: qty 250

## 2019-05-22 MED ORDER — TRASTUZUMAB CHEMO 150 MG IV SOLR
6.0000 mg/kg | Freq: Once | INTRAVENOUS | Status: AC
  Administered 2019-05-22: 378 mg via INTRAVENOUS
  Filled 2019-05-22: qty 18

## 2019-05-22 NOTE — Patient Instructions (Signed)

## 2019-05-29 ENCOUNTER — Ambulatory Visit (HOSPITAL_COMMUNITY)
Admission: RE | Admit: 2019-05-29 | Discharge: 2019-05-29 | Disposition: A | Discharge status: Home / Self Care | Payer: 59 | Source: Ambulatory Visit | Attending: Adult Health | Admitting: Adult Health

## 2019-05-29 ENCOUNTER — Other Ambulatory Visit: Payer: Self-pay

## 2019-05-29 DIAGNOSIS — Z95828 Presence of other vascular implants and grafts: Secondary | ICD-10-CM

## 2019-05-29 DIAGNOSIS — Z79899 Other long term (current) drug therapy: Secondary | ICD-10-CM | POA: Diagnosis not present

## 2019-05-29 DIAGNOSIS — C50411 Malignant neoplasm of upper-outer quadrant of right female breast: Secondary | ICD-10-CM

## 2019-05-29 DIAGNOSIS — Z5181 Encounter for therapeutic drug level monitoring: Secondary | ICD-10-CM | POA: Diagnosis not present

## 2019-05-29 DIAGNOSIS — Z170 Estrogen receptor positive status [ER+]: Secondary | ICD-10-CM | POA: Diagnosis not present

## 2019-05-29 NOTE — Progress Notes (Signed)
  Echocardiogram 2D Echocardiogram has been performed.  Remi Rester G Freeland Pracht 05/29/2019, 11:59 AM

## 2019-06-05 ENCOUNTER — Ambulatory Visit: Payer: BLUE CROSS/BLUE SHIELD

## 2019-06-12 ENCOUNTER — Inpatient Hospital Stay: Payer: 59 | Attending: Hematology and Oncology

## 2019-06-12 ENCOUNTER — Other Ambulatory Visit: Payer: Self-pay

## 2019-06-12 VITALS — BP 134/84 | HR 74 | Temp 98.9°F | Resp 16 | Wt 129.2 lb

## 2019-06-12 DIAGNOSIS — Z79899 Other long term (current) drug therapy: Secondary | ICD-10-CM | POA: Diagnosis not present

## 2019-06-12 DIAGNOSIS — Z7952 Long term (current) use of systemic steroids: Secondary | ICD-10-CM | POA: Diagnosis not present

## 2019-06-12 DIAGNOSIS — Z9221 Personal history of antineoplastic chemotherapy: Secondary | ICD-10-CM | POA: Diagnosis not present

## 2019-06-12 DIAGNOSIS — Z5112 Encounter for antineoplastic immunotherapy: Secondary | ICD-10-CM | POA: Insufficient documentation

## 2019-06-12 DIAGNOSIS — T451X5D Adverse effect of antineoplastic and immunosuppressive drugs, subsequent encounter: Secondary | ICD-10-CM | POA: Diagnosis not present

## 2019-06-12 DIAGNOSIS — Z79810 Long term (current) use of selective estrogen receptor modulators (SERMs): Secondary | ICD-10-CM | POA: Diagnosis not present

## 2019-06-12 DIAGNOSIS — G620 Drug-induced polyneuropathy: Secondary | ICD-10-CM | POA: Diagnosis not present

## 2019-06-12 DIAGNOSIS — Z90710 Acquired absence of both cervix and uterus: Secondary | ICD-10-CM | POA: Insufficient documentation

## 2019-06-12 DIAGNOSIS — Z923 Personal history of irradiation: Secondary | ICD-10-CM | POA: Diagnosis not present

## 2019-06-12 DIAGNOSIS — Z170 Estrogen receptor positive status [ER+]: Secondary | ICD-10-CM | POA: Diagnosis not present

## 2019-06-12 DIAGNOSIS — C50411 Malignant neoplasm of upper-outer quadrant of right female breast: Secondary | ICD-10-CM | POA: Diagnosis not present

## 2019-06-12 MED ORDER — SODIUM CHLORIDE 0.9 % IV SOLN
Freq: Once | INTRAVENOUS | Status: AC
  Filled 2019-06-12: qty 250

## 2019-06-12 MED ORDER — TRASTUZUMAB CHEMO 150 MG IV SOLR
6.0000 mg/kg | Freq: Once | INTRAVENOUS | Status: AC
  Administered 2019-06-12: 378 mg via INTRAVENOUS
  Filled 2019-06-12: qty 7.14

## 2019-06-12 MED ORDER — SODIUM CHLORIDE 0.9 % IV SOLN
420.0000 mg | Freq: Once | INTRAVENOUS | Status: AC
  Administered 2019-06-12: 420 mg via INTRAVENOUS
  Filled 2019-06-12: qty 14

## 2019-06-12 MED ORDER — ACETAMINOPHEN 325 MG PO TABS
650.0000 mg | ORAL_TABLET | Freq: Once | ORAL | Status: AC
  Administered 2019-06-12: 650 mg via ORAL

## 2019-06-12 MED ORDER — DIPHENHYDRAMINE HCL 25 MG PO CAPS
ORAL_CAPSULE | ORAL | Status: AC
  Filled 2019-06-12: qty 1

## 2019-06-12 MED ORDER — DIPHENHYDRAMINE HCL 25 MG PO CAPS
50.0000 mg | ORAL_CAPSULE | Freq: Once | ORAL | Status: AC
  Administered 2019-06-12: 50 mg via ORAL

## 2019-06-12 MED ORDER — DIPHENHYDRAMINE HCL 25 MG PO CAPS
ORAL_CAPSULE | ORAL | Status: AC
  Filled 2019-06-12: qty 2

## 2019-06-12 MED ORDER — ACETAMINOPHEN 325 MG PO TABS
ORAL_TABLET | ORAL | Status: AC
  Filled 2019-06-12: qty 2

## 2019-06-12 MED ORDER — SODIUM CHLORIDE 0.9% FLUSH
10.0000 mL | INTRAVENOUS | Status: DC | PRN
  Administered 2019-06-12: 10 mL
  Filled 2019-06-12: qty 10

## 2019-06-12 MED ORDER — HEPARIN SOD (PORK) LOCK FLUSH 100 UNIT/ML IV SOLN
500.0000 [IU] | Freq: Once | INTRAVENOUS | Status: AC | PRN
  Administered 2019-06-12: 500 [IU]
  Filled 2019-06-12: qty 5

## 2019-06-12 NOTE — Progress Notes (Signed)
Pt. states bilateral hands have become increasingly stiffer and the joints are popping out at times making it difficult to use hands and work. States Advil/Ibuphroen has not helped. Sandi Mealy PA notified for assessment. Per Lucianne Lei, pt. Pt. Instructed to take Tylenol and apply heat packs and let MD know if symptoms do not get better.

## 2019-06-12 NOTE — Patient Instructions (Signed)
[facility] Discharge Instructions for Patients Receiving Chemotherapy  Today you received the following Immunotherapy agents: Trastuzumab, Pertuzumab  To help prevent nausea and vomiting after your treatment, we encourage you to take your nausea medication as directed by your MD.   If you develop nausea and vomiting that is not controlled by your nausea medication, call the clinic.   BELOW ARE SYMPTOMS THAT SHOULD BE REPORTED IMMEDIATELY:  *FEVER GREATER THAN 100.5 F  *CHILLS WITH OR WITHOUT FEVER  NAUSEA AND VOMITING THAT IS NOT CONTROLLED WITH YOUR NAUSEA MEDICATION  *UNUSUAL SHORTNESS OF BREATH  *UNUSUAL BRUISING OR BLEEDING  TENDERNESS IN MOUTH AND THROAT WITH OR WITHOUT PRESENCE OF ULCERS  *URINARY PROBLEMS  *BOWEL PROBLEMS  UNUSUAL RASH Items with * indicate a potential emergency and should be followed up as soon as possible.  Feel free to call the clinic should you have any questions or concerns. The clinic phone number is [phone].  Please show the [facility] at check-in to the Emergency Department and triage nurse.  Coronavirus (COVID-19) Are you at risk?  Are you at risk for the Coronavirus (COVID-19)?  To be considered HIGH RISK for Coronavirus (COVID-19), you have to meet the following criteria:  . Traveled to [place], [place], [place], [place] or [place]; or in the [place] to [place], [place], [place], or [place]; and have fever, cough, and shortness of breath within the last 2 weeks of travel OR . Been in close contact with a person diagnosed with COVID-19 within the last 2 weeks and have fever, cough, and shortness of breath . IF YOU DO NOT MEET THESE CRITERIA, YOU ARE CONSIDERED LOW RISK FOR COVID-19.  What to do if you are HIGH RISK for COVID-19?  Marland Kitchen If you are having a medical emergency, call 911. . Seek medical care right away. Before you go to a doctor's office, urgent care or emergency department, call ahead and  tell them about your recent travel, contact with someone diagnosed with COVID-19, and your symptoms. You should receive instructions from your physician's office regarding next steps of care.  . When you arrive at healthcare provider, tell the healthcare staff immediately you have returned from visiting [place], [place], [place], [place] or [place]; or traveled in the [place] to [place], [place], [place], or [place]; in the last two weeks or you have been in close contact with a person diagnosed with COVID-19 in the last 2 weeks.   . Tell the health care staff about your symptoms: fever, cough and shortness of breath. . After you have been seen by a medical provider, you will be either: o Tested for (COVID-19) and discharged home on quarantine except to seek medical care if symptoms worsen, and asked to  - Stay home and avoid contact with others until you get your results (4-5 days)  - Avoid travel on public transportation if possible (such as bus, train, or airplane) or o Sent to the Emergency Department by EMS for evaluation, COVID-19 testing, and possible admission depending on your condition and test results.  What to do if you are LOW RISK for COVID-19?  Reduce your risk of any infection by using the same precautions used for avoiding the common cold or flu:  Marland Kitchen Wash your hands often with soap and warm water for at least 20 seconds.  If soap and water are not readily available, use an alcohol-based hand sanitizer with at least 60% alcohol.  Marland Kitchen  If coughing or sneezing, cover your mouth and nose by coughing or sneezing into the elbow areas of your shirt or coat, into a tissue or into your sleeve (not your hands). . Avoid shaking hands with others and consider head nods or verbal greetings only. . Avoid touching your eyes, nose, or mouth with unwashed hands.  . Avoid close contact with people who are sick. . Avoid places or events with large numbers of people in one location, like  concerts or sporting events. . Carefully consider travel plans you have or are making. . If you are planning any travel outside or inside the [place], visit the CDC's Travelers' Health webpage for the latest health notices. . If you have some symptoms but not all symptoms, continue to monitor at home and seek medical attention if your symptoms worsen. . If you are having a medical emergency, call 911.   ADDITIONAL HEALTHCARE OPTIONS FOR PATIENTS  [facility] / e-Visit: [email or web address]         [facility]: [phone]  [facility]: [phone]                   [facility]: [phone]  

## 2019-06-15 DIAGNOSIS — Z1212 Encounter for screening for malignant neoplasm of rectum: Secondary | ICD-10-CM | POA: Diagnosis not present

## 2019-06-15 DIAGNOSIS — Z01419 Encounter for gynecological examination (general) (routine) without abnormal findings: Secondary | ICD-10-CM | POA: Diagnosis not present

## 2019-06-17 ENCOUNTER — Other Ambulatory Visit: Payer: Self-pay

## 2019-06-17 ENCOUNTER — Encounter: Payer: Self-pay | Admitting: Adult Health

## 2019-06-17 DIAGNOSIS — M25539 Pain in unspecified wrist: Secondary | ICD-10-CM

## 2019-06-23 DIAGNOSIS — C50911 Malignant neoplasm of unspecified site of right female breast: Secondary | ICD-10-CM | POA: Diagnosis not present

## 2019-06-23 DIAGNOSIS — Z9049 Acquired absence of other specified parts of digestive tract: Secondary | ICD-10-CM | POA: Diagnosis not present

## 2019-06-23 DIAGNOSIS — Z79891 Long term (current) use of opiate analgesic: Secondary | ICD-10-CM | POA: Diagnosis not present

## 2019-06-23 DIAGNOSIS — Z9221 Personal history of antineoplastic chemotherapy: Secondary | ICD-10-CM | POA: Diagnosis not present

## 2019-06-23 DIAGNOSIS — Z79899 Other long term (current) drug therapy: Secondary | ICD-10-CM | POA: Diagnosis not present

## 2019-06-23 DIAGNOSIS — Z9851 Tubal ligation status: Secondary | ICD-10-CM | POA: Diagnosis not present

## 2019-06-23 DIAGNOSIS — Z9889 Other specified postprocedural states: Secondary | ICD-10-CM | POA: Diagnosis not present

## 2019-06-23 DIAGNOSIS — Z79810 Long term (current) use of selective estrogen receptor modulators (SERMs): Secondary | ICD-10-CM | POA: Diagnosis not present

## 2019-06-25 ENCOUNTER — Ambulatory Visit (INDEPENDENT_AMBULATORY_CARE_PROVIDER_SITE_OTHER): Payer: 59 | Admitting: Orthopaedic Surgery

## 2019-06-25 ENCOUNTER — Ambulatory Visit (INDEPENDENT_AMBULATORY_CARE_PROVIDER_SITE_OTHER): Payer: 59

## 2019-06-25 ENCOUNTER — Encounter: Payer: Self-pay | Admitting: *Deleted

## 2019-06-25 ENCOUNTER — Encounter: Payer: Self-pay | Admitting: Orthopaedic Surgery

## 2019-06-25 ENCOUNTER — Ambulatory Visit: Payer: Self-pay

## 2019-06-25 DIAGNOSIS — M65352 Trigger finger, left little finger: Secondary | ICD-10-CM

## 2019-06-25 DIAGNOSIS — M79642 Pain in left hand: Secondary | ICD-10-CM

## 2019-06-25 DIAGNOSIS — M65312 Trigger thumb, left thumb: Secondary | ICD-10-CM | POA: Diagnosis not present

## 2019-06-25 DIAGNOSIS — M79641 Pain in right hand: Secondary | ICD-10-CM

## 2019-06-25 MED ORDER — BUPIVACAINE HCL 0.25 % IJ SOLN
0.3300 mL | INTRAMUSCULAR | Status: AC | PRN
  Administered 2019-06-25: 09:00:00 .33 mL

## 2019-06-25 MED ORDER — METHYLPREDNISOLONE ACETATE 40 MG/ML IJ SUSP
13.3300 mg | INTRAMUSCULAR | Status: AC | PRN
  Administered 2019-06-25: 09:00:00 13.33 mg

## 2019-06-25 MED ORDER — LIDOCAINE HCL 1 % IJ SOLN
1.0000 mL | INTRAMUSCULAR | Status: AC | PRN
  Administered 2019-06-25: 09:00:00 1 mL

## 2019-06-25 MED ORDER — PREDNISONE 5 MG (21) PO TBPK: ORAL_TABLET | ORAL | 0 refills | Status: DC

## 2019-06-25 NOTE — Progress Notes (Signed)
Office Visit Note   Patient: Megan Acevedo           Date of Birth: 08-May-1970           MRN: [id number] Visit Date: 06/25/2019              Requested by: Gardenia Phlegm, NP [place],  [place] [id number] PCP: [name], MD   Assessment & Plan: Visit Diagnoses:  1. Bilateral hand pain   2. Trigger finger of left thumb   3. Trigger little finger of left hand     Plan: Impression is left small trigger finger and possible trigger fingers to the right long finger as well as both thumbs although this would be an abnormal presentation.  We injected the left small finger and left thumb trigger finger with cortisone.  I also called in a Sterapred taper to help with her stiffness.  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  She will follow-up with [place] as needed.  Follow-Up Instructions: Return if symptoms worsen or fail to improve.   Orders:  Orders Placed This Encounter  Procedures  . Hand/UE Inj: L thumb A1  . Hand/UE Inj: L small A1  . XR Hand Complete Left  . XR Hand Complete Right   No orders of the defined types were placed in this encounter.     Procedures: Hand/UE Inj: L thumb A1 for trigger finger on 06/25/2019 9:08 AM Indications: pain Details: 25 G needle Medications: 1 mL lidocaine 1 %; 0.33 mL bupivacaine 0.25 %; 13.33 mg methylPREDNISolone acetate 40 MG/ML  Hand/UE Inj: L small A1 for trigger finger on 06/25/2019 9:08 AM Indications: pain Details: 25 G needle Medications: 1 mL lidocaine 1 %; 0.33 mL bupivacaine 0.25 %; 13.33 mg methylPREDNISolone acetate 40 MG/ML      Clinical Data: No additional findings.   Subjective: Chief Complaint  Patient presents with  . Left Hand - Pain  . Right Hand - Pain    HPI patient is a pleasant 49 year old who presents to our clinic today with bilateral  hand pain both equally as bad.  She noticed this in early January of this past year.  No known injury or change in activity.  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  She is also recovering from breast cancer and has been on several medications.  The pain she has is to the entire aspect of both hands particularly the left small finger, right long finger and both thumbs.  Pain she has to the left small fingers at the A1 pulley.  She does have associated triggering.  The pain to the right long and bilateral thumbs is characterized primarily by stiffness.  She does note that she has difficulty flexing the thumbs.  No numbness, tingling or burning.  Review of Systems as detailed in HPI.  All others reviewed and are negative.   Objective: Vital Signs: There were no vitals taken for this visit.  Physical Exam well-developed well-nourished female no acute distress.  Alert oriented x3.  Ortho Exam examination of both hands reveals full extension and flexion of all 10 fingers.  She does have a markedly tender and palpable nodule at the A1 pulley of the left small and right long fingers as well as both thumbs.  She has noticeable triggering at the left small finger.  She is neurovascularly intact distally.  Specialty Comments:  No specialty comments available.  Imaging: XR Hand Complete Left  Result Date: 06/25/2019 No acute or structural abnormalities  XR Hand Complete Right  Result Date: 06/25/2019 No acute or structural abnormalities    PMFS History: Patient Active Problem List   Diagnosis Date Noted  . Port-A-Cath in place 10/15/2018  . Malignant neoplasm of upper-outer quadrant of right breast in female, estrogen receptor positive ([place]) 07/23/2018   Past Medical History:  Diagnosis Date  . Cancer [facility])     History reviewed. No pertinent family history.  Past Surgical History:  Procedure Laterality Date  . ABDOMINAL HYSTERECTOMY    . BREAST LUMPECTOMY WITH  RADIOACTIVE SEED AND SENTINEL LYMPH NODE BIOPSY Right 08/20/2018   Procedure: RIGHT BREAST LUMPECTOMY WITH RADIOACTIVE SEED AND RIGHT AXILLARY SENTINEL LYMPH NODE BIOPSY;  Surgeon: [name], MD;  Location: [facility];  Service: General;  Laterality: Right;  . CHOLECYSTECTOMY    . PORTACATH PLACEMENT Right 08/20/2018   Procedure: INSERTION PORT-A-CATH WITH ULTRASOUND;  Surgeon: [name], MD;  Location: [facility];  Service: General;  Laterality: Right;  . TUBAL LIGATION     Social History   Occupational History  . Not on file  Tobacco Use  . Smoking status: Never Smoker  . Smokeless tobacco: Never Used  Substance and Sexual Activity  . Alcohol use: Never  . Drug use: Never  . Sexual activity: Not on file

## 2019-06-26 ENCOUNTER — Other Ambulatory Visit: Payer: BLUE CROSS/BLUE SHIELD

## 2019-06-26 ENCOUNTER — Encounter: Payer: BLUE CROSS/BLUE SHIELD | Admitting: Adult Health

## 2019-06-26 ENCOUNTER — Ambulatory Visit: Payer: BLUE CROSS/BLUE SHIELD

## 2019-06-30 ENCOUNTER — Encounter: Payer: Self-pay | Admitting: *Deleted

## 2019-07-03 ENCOUNTER — Inpatient Hospital Stay: Payer: 59

## 2019-07-03 ENCOUNTER — Other Ambulatory Visit: Payer: Self-pay

## 2019-07-03 ENCOUNTER — Encounter: Payer: Self-pay | Admitting: Adult Health

## 2019-07-03 ENCOUNTER — Other Ambulatory Visit: Payer: Self-pay | Admitting: Hematology and Oncology

## 2019-07-03 ENCOUNTER — Inpatient Hospital Stay (HOSPITAL_BASED_OUTPATIENT_CLINIC_OR_DEPARTMENT_OTHER): Payer: 59 | Admitting: Adult Health

## 2019-07-03 VITALS — BP 157/79 | HR 69 | Temp 98.9°F | Resp 20 | Ht 65.0 in | Wt 132.2 lb

## 2019-07-03 VITALS — BP 151/85 | HR 89 | Temp 98.9°F | Resp 16

## 2019-07-03 DIAGNOSIS — C50411 Malignant neoplasm of upper-outer quadrant of right female breast: Secondary | ICD-10-CM

## 2019-07-03 DIAGNOSIS — Z170 Estrogen receptor positive status [ER+]: Secondary | ICD-10-CM

## 2019-07-03 DIAGNOSIS — Z5112 Encounter for antineoplastic immunotherapy: Secondary | ICD-10-CM | POA: Diagnosis not present

## 2019-07-03 DIAGNOSIS — G620 Drug-induced polyneuropathy: Secondary | ICD-10-CM | POA: Diagnosis not present

## 2019-07-03 DIAGNOSIS — Z95828 Presence of other vascular implants and grafts: Secondary | ICD-10-CM

## 2019-07-03 DIAGNOSIS — Z79810 Long term (current) use of selective estrogen receptor modulators (SERMs): Secondary | ICD-10-CM | POA: Diagnosis not present

## 2019-07-03 DIAGNOSIS — T451X5D Adverse effect of antineoplastic and immunosuppressive drugs, subsequent encounter: Secondary | ICD-10-CM | POA: Diagnosis not present

## 2019-07-03 DIAGNOSIS — Z9221 Personal history of antineoplastic chemotherapy: Secondary | ICD-10-CM | POA: Diagnosis not present

## 2019-07-03 DIAGNOSIS — Z923 Personal history of irradiation: Secondary | ICD-10-CM | POA: Diagnosis not present

## 2019-07-03 DIAGNOSIS — Z90710 Acquired absence of both cervix and uterus: Secondary | ICD-10-CM | POA: Diagnosis not present

## 2019-07-03 LAB — CMP (CANCER CENTER ONLY)
ALT: 13 U/L (ref 0–44)
AST: 12 U/L — ABNORMAL LOW (ref 15–41)
Albumin: 4 g/dL (ref 3.5–5.0)
Alkaline Phosphatase: 90 U/L (ref 38–126)
Anion gap: 10 (ref 5–15)
BUN: 16 mg/dL (ref 6–20)
CO2: 24 mmol/L (ref 22–32)
Calcium: 8.9 mg/dL (ref 8.9–10.3)
Chloride: 108 mmol/L (ref 98–111)
Creatinine: 0.79 mg/dL (ref 0.44–1.00)
GFR, Est AFR Am: >60 mL/min (ref >60)
GFR, Estimated: >60 mL/min (ref >60)
Glucose, Bld: 76 mg/dL (ref 70–99)
Potassium: 3.8 mmol/L (ref 3.5–5.1)
Sodium: 142 mmol/L (ref 135–145)
Total Bilirubin: 0.6 mg/dL (ref 0.3–1.2)
Total Protein: 7.1 g/dL (ref 6.5–8.1)

## 2019-07-03 LAB — CBC WITH DIFFERENTIAL (CANCER CENTER ONLY)
Abs Immature Granulocytes: 0.01 10*3/uL (ref 0.00–0.07)
Basophils Absolute: 0.1 10*3/uL (ref 0.0–0.1)
Basophils Relative: 1 %
Eosinophils Absolute: 0.1 10*3/uL (ref 0.0–0.5)
Eosinophils Relative: 2 %
HCT: 40.7 % (ref 36.0–46.0)
Hemoglobin: 13.6 g/dL (ref 12.0–15.0)
Immature Granulocytes: 0 %
Lymphocytes Relative: 38 %
Lymphs Abs: 2.2 10*3/uL (ref 0.7–4.0)
MCH: 32 pg (ref 26.0–34.0)
MCHC: 33.4 g/dL (ref 30.0–36.0)
MCV: 95.8 fL (ref 80.0–100.0)
Monocytes Absolute: 0.4 10*3/uL (ref 0.1–1.0)
Monocytes Relative: 7 %
Neutro Abs: 3 10*3/uL (ref 1.7–7.7)
Neutrophils Relative %: 52 %
Platelet Count: 246 10*3/uL (ref 150–400)
RBC: 4.25 MIL/uL (ref 3.87–5.11)
RDW: 12.1 % (ref 11.5–15.5)
WBC Count: 5.9 10*3/uL (ref 4.0–10.5)
nRBC: 0 % (ref 0.0–0.2)

## 2019-07-03 MED ORDER — SODIUM CHLORIDE 0.9 % IV SOLN
420.0000 mg | Freq: Once | INTRAVENOUS | Status: AC
  Administered 2019-07-03: 420 mg via INTRAVENOUS
  Filled 2019-07-03: qty 14

## 2019-07-03 MED ORDER — TRASTUZUMAB CHEMO 150 MG IV SOLR
6.0000 mg/kg | Freq: Once | INTRAVENOUS | Status: AC
  Administered 2019-07-03: 378 mg via INTRAVENOUS
  Filled 2019-07-03: qty 18

## 2019-07-03 MED ORDER — SODIUM CHLORIDE 0.9% FLUSH
10.0000 mL | INTRAVENOUS | Status: DC | PRN
  Administered 2019-07-03: 10 mL
  Filled 2019-07-03: qty 10

## 2019-07-03 MED ORDER — DIPHENHYDRAMINE HCL 25 MG PO CAPS
ORAL_CAPSULE | ORAL | Status: AC
  Filled 2019-07-03: qty 2

## 2019-07-03 MED ORDER — DIPHENHYDRAMINE HCL 25 MG PO CAPS
50.0000 mg | ORAL_CAPSULE | Freq: Once | ORAL | Status: AC
  Administered 2019-07-03: 50 mg via ORAL

## 2019-07-03 MED ORDER — SODIUM CHLORIDE 0.9% FLUSH
10.0000 mL | Freq: Once | INTRAVENOUS | Status: AC
  Administered 2019-07-03: 10 mL
  Filled 2019-07-03: qty 10

## 2019-07-03 MED ORDER — ACETAMINOPHEN 325 MG PO TABS
650.0000 mg | ORAL_TABLET | Freq: Once | ORAL | Status: AC
  Administered 2019-07-03: 650 mg via ORAL

## 2019-07-03 MED ORDER — ACETAMINOPHEN 325 MG PO TABS
ORAL_TABLET | ORAL | Status: AC
  Filled 2019-07-03: qty 2

## 2019-07-03 MED ORDER — HEPARIN SOD (PORK) LOCK FLUSH 100 UNIT/ML IV SOLN
500.0000 [IU] | Freq: Once | INTRAVENOUS | Status: AC | PRN
  Administered 2019-07-03: 500 [IU]
  Filled 2019-07-03: qty 5

## 2019-07-03 MED ORDER — SODIUM CHLORIDE 0.9 % IV SOLN
Freq: Once | INTRAVENOUS | Status: AC
  Filled 2019-07-03: qty 250

## 2019-07-03 NOTE — Progress Notes (Signed)
Pt. decided not to stay for 30 minute post observation. States she she "feels fine". Left via ambulation, no respiratory distress noted.

## 2019-07-03 NOTE — Progress Notes (Signed)
SURVIVORSHIP VISIT:    BRIEF ONCOLOGIC HISTORY:  Oncology History  Malignant neoplasm of upper-outer quadrant of right breast in female, estrogen receptor positive ([place])  06/26/2018 Initial Diagnosis   [place]: Screening mammogram detected focal asymmetry in the right breast, ultrasound revealed 2 cm oval mass: Biopsy revealed grade 1-2 invasive ductal carcinoma, ER 3+, PR 3+, HER-2 3+ positive T1c M0 stage Ia clinical stage   07/23/2018 Cancer Staging   Staging form: Breast, AJCC 8th Edition - Clinical stage from 07/23/2018: Stage IA (cT1c, cN0, cM0, G2, ER+, PR+, HER2+)   07/2018 Genetic Testing   Genetic testing in [place], [place], negative   08/20/2018 Surgery   Right lumpectomy Donne Hazel) [phone]): Grade 2 IDC with DCIS, 2.2 cm, margins negative, 1/1 lymph node positive with focal extranodal extension, ER 50%, PR 50%, HER-2 positive, 3+, Ki-67 10 to 15%, T2N1   09/03/2018 Cancer Staging   Staging form: Breast, AJCC 8th Edition - Pathologic: Stage IB (pT2, pN1a, cM0, G2, ER+, PR+, HER2+) - Signed by Gardenia Phlegm, NP on 09/03/2018   10/04/2018 - 05/22/2019 Chemotherapy   palonosetron (ALOXI) injection 0.25 mg, 0.25 mg, Intravenous,  Once, 6 of 6 cycles Administration: 0.25 mg (10/15/2018), 0.25 mg (11/05/2018), 0.25 mg (12/17/2018), 0.25 mg (01/07/2019), 0.25 mg (11/26/2018), 0.25 mg (09/24/2018)  pegfilgrastim-cbqv (UDENYCA) injection 6 mg, 6 mg, Subcutaneous, Once, 6 of 6 cycles. Administration: 6 mg (09/25/2018), 6 mg (10/17/2018), 6 mg (11/06/2018), 6 mg (12/19/2018), 6 mg (01/08/2019), 6 mg (11/28/2018)  trastuzumab (HERCEPTIN) 504 mg in sodium chloride 0.9 % 250 mL chemo infusion, 8 mg/kg = 504 mg, Intravenous,  Once, 9 of 14 cycles. Dose modification: 6 mg/kg (original dose 6 mg/kg, Cycle 7, Reason: Other (see comments), Comment: Insurance will only approve Herceptin at this time.), 6 mg/kg (original dose 6 mg/kg, Cycle 12, Reason: Other (see comments), Comment: pt did not  tolerate Kanjinti (itching)). Administration: 378 mg (10/15/2018), 378 mg (11/05/2018), 504 mg (09/24/2018), 378 mg (01/28/2019), 378 mg (02/18/2019), 378 mg (03/11/2019), 378 mg (04/01/2019), 378 mg (05/22/2019)  CARBOplatin (PARAPLATIN) 700 mg in sodium chloride 0.9 % 250 mL chemo infusion, 700 mg (100 % of original dose 700 mg), Intravenous,  Once, 6 of 6 cycles. Dose modification: 700 mg (original dose 700 mg, Cycle 1). Administration: 700 mg (10/15/2018), 700 mg (11/05/2018), 700 mg (12/17/2018), 700 mg (01/07/2019), 700 mg (11/26/2018), 700 mg (09/24/2018)  DOCEtaxel (TAXOTERE) 130 mg in sodium chloride 0.9 % 250 mL chemo infusion, 75 mg/m2 = 130 mg, Intravenous,  Once, 6 of 6 cycles. Administration: 130 mg (10/15/2018), 130 mg (11/05/2018), 130 mg (12/17/2018), 130 mg (01/07/2019), 130 mg (11/26/2018), 130 mg (09/24/2018)  pertuzumab (PERJETA) 420 mg in sodium chloride 0.9 % 250 mL chemo infusion, 420 mg (50 % of original dose 840 mg), Intravenous, Once, 12 of 17 cycles. Dose modification: 420 mg (original dose 840 mg, Cycle 1, Reason: Provider Judgment). Administration: 420 mg (10/15/2018), 420 mg (11/05/2018), 420 mg (12/17/2018), 420 mg (01/07/2019), 420 mg (01/28/2019), 420 mg (03/11/2019), 420 mg (04/01/2019), 420 mg (11/26/2018), 420 mg (02/18/2019), 420 mg (04/24/2019), 420 mg (05/22/2019), 420 mg (09/24/2018)  fosaprepitant (EMEND) 150 mg   dexamethasone (DECADRON) 12 mg in sodium chloride 0.9 % 145 mL IVPB, , Intravenous,  Once, 6 of 6 cycles. Administration:  (10/15/2018),  (11/05/2018),  (12/17/2018),  (01/07/2019),  (11/26/2018),  (09/24/2018)  trastuzumab-dkst (OGIVRI) 378 mg in sodium chloride 0.9 % 250 mL chemo infusion, 6 mg/kg = 378 mg (100 % of original dose 6 mg/kg), Intravenous,  Once, 3 of 3 cycles. Dose modification: 6 mg/kg (original dose 6 mg/kg, Cycle 4, Reason: Other (see comments), Comment: Biosimilar Conversion). Administration: 378 mg (11/26/2018), 378 mg (12/17/2018), 378 mg (01/07/2019)     - 03/23/2019  Radiation Therapy   Completed at [facility]   03/2019 - 03/2024 Anti-estrogen oral therapy   Tamoxifen     INTERVAL HISTORY:  Megan Acevedo to review her survivorship care plan detailing her treatment course for breast cancer, as well as monitoring long-term side effects of that treatment, education regarding health maintenance, screening, and overall wellness and health promotion.     Overall, Megan Acevedo reports feeling quite well.  She continues on Tamoxifen daily.  She has some occasional urge incontinence, but is otherwise tolerating it well.  She recently underwent injections in her hands for trigger finger.  She has mild constant peripheral neuropathy in the tips of her fingers that started with chemotherapy.  It does not cause any motor deficits.  She completed the survivorship survey and does have some cognitive dysfunction, however this is improving.    REVIEW OF SYSTEMS:  Review of Systems  Constitutional: Negative for appetite change, chills, fatigue, fever and unexpected weight change.  HENT:   Negative for hearing loss, lump/mass, sore throat and trouble swallowing.   Eyes: Negative for eye problems and icterus.  Respiratory: Negative for chest tightness, cough and shortness of breath.   Cardiovascular: Negative for chest pain, leg swelling and palpitations.  Gastrointestinal: Negative for abdominal distention, abdominal pain, constipation, diarrhea, nausea and vomiting.  Endocrine: Negative for hot flashes.  Genitourinary: Negative for difficulty urinating.   Musculoskeletal: Negative for arthralgias.  Skin: Negative for itching and rash.  Neurological: Negative for dizziness, extremity weakness, headaches and numbness.  Hematological: Negative for adenopathy. Does not bruise/bleed easily.  Psychiatric/Behavioral: Negative for depression. The patient is not nervous/anxious.    Breast: Denies any new nodularity, masses, tenderness, nipple changes, or nipple discharge.       ONCOLOGY TREATMENT TEAM:  1. Surgeon:  Dr. Donne Hazel at [facility] 2. Medical Oncologist: Dr. Lindi Adie      PAST MEDICAL/SURGICAL HISTORY:  Past Medical History:  Diagnosis Date  . Cancer [facility])    Past Surgical History:  Procedure Laterality Date  . ABDOMINAL HYSTERECTOMY    . BREAST LUMPECTOMY WITH RADIOACTIVE SEED AND SENTINEL LYMPH NODE BIOPSY Right 08/20/2018   Procedure: RIGHT BREAST LUMPECTOMY WITH RADIOACTIVE SEED AND RIGHT AXILLARY SENTINEL LYMPH NODE BIOPSY;  Surgeon: [name], MD;  Location: [place];  Service: General;  Laterality: Right;  . CHOLECYSTECTOMY    . PORTACATH PLACEMENT Right 08/20/2018   Procedure: INSERTION PORT-A-CATH WITH ULTRASOUND;  Surgeon: [name], MD;  Location: [place];  Service: General;  Laterality: Right;  . TUBAL LIGATION       ALLERGIES:  No Known Allergies   CURRENT MEDICATIONS:  Outpatient Encounter Medications as of 07/03/2019  Medication Sig  . acetaminophen (TYLENOL) 325 MG tablet Take 650 mg by mouth every 6 (six) hours as needed.  . benzoyl peroxide-erythromycin (BENZAMYCIN) gel Apply topically 2 (two) times daily.  . diphenoxylate-atropine (LOMOTIL) 2.5-0.025 MG tablet Take 1 tablet by mouth 4 (four) times daily as needed for diarrhea or loose stools.  Marland Kitchen LATISSE 0.03 % ophthalmic solution Place 1 application into both eyes at bedtime. Place one drop on applicator and apply evenly along the skin of the upper eyelid at base of eyelashes once daily at bedtime; repeat procedure for second eye (use a  clean applicator).  Marland Kitchen lidocaine-prilocaine (EMLA) cream Apply to affected area once  . LORazepam (ATIVAN) 0.5 MG tablet Take 1 tablet (0.5 mg total) by mouth at bedtime as needed (Nausea or vomiting).  . Multiple Vitamins-Minerals (CENTRUM SILVER 50+WOMEN) TABS Take 1 each by mouth every morning.  Marland Kitchen omeprazole (PRILOSEC) 20 MG capsule Take 20 mg by mouth daily.  .  ondansetron (ZOFRAN) 8 MG tablet Take 1 tablet (8 mg total) by mouth 2 (two) times daily as needed (Nausea or vomiting). Begin 4 days after chemotherapy.  Marland Kitchen oxybutynin (DITROPAN) 5 MG tablet Take 5 mg by mouth 3 (three) times daily.  . prochlorperazine (COMPAZINE) 10 MG tablet Take 1 tablet (10 mg total) by mouth every 6 (six) hours as needed (Nausea or vomiting).  . tamoxifen (NOLVADEX) 10 MG tablet Take 1 tablet (10 mg total) by mouth 2 (two) times daily.  . [DISCONTINUED] predniSONE (STERAPRED UNI-PAK 21 TAB) 5 MG (21) TBPK tablet Take as directed   No facility-administered encounter medications on file as of 07/03/2019.     ONCOLOGIC FAMILY HISTORY:  History reviewed. No pertinent family history.   GENETIC COUNSELING/TESTING: Tested in [place], [place] HISTORY:  Social History   Socioeconomic History  . Marital status: Married    Spouse name: Not on file  . Number of children: Not on file  . Years of education: Not on file  . Highest education level: Not on file  Occupational History  . Not on file  Tobacco Use  . Smoking status: Never Smoker  . Smokeless tobacco: Never Used  Substance and Sexual Activity  . Alcohol use: Never  . Drug use: Never  . Sexual activity: Not on file  Other Topics Concern  . Not on file  Social History Narrative  . Not on file   Social Determinants of Health   Financial Resource Strain:   . Difficulty of Paying Living Expenses:   Food Insecurity:   . Worried About Charity fundraiser in the Last Year:   . Arboriculturist in the Last Year:   Transportation Needs:   . Film/video editor (Medical):   Marland Kitchen Lack of Transportation (Non-Medical):   Physical Activity:   . Days of Exercise per Week:   . Minutes of Exercise per Session:   Stress:   . Feeling of Stress :   Social Connections:   . Frequency of Communication with Friends and Family:   . Frequency of Social Gatherings with Friends and Family:   . Attends Religious  Services:   . Active Member of Clubs or Organizations:   . Attends Archivist Meetings:   Marland Kitchen Marital Status:   Intimate Partner Violence:   . Fear of Current or Ex-Partner:   . Emotionally Abused:   Marland Kitchen Physically Abused:   . Sexually Abused:      OBSERVATIONS/OBJECTIVE:   BP (!) 157/79 (BP Location: Left Arm, Patient Position: Sitting)   Pulse 69   Temp 98.9 F (37.2 C)   Resp 20   Ht _0  (1.651 m)   Wt 132 lb 3.2 oz (60 kg)   SpO2 100%   BMI 22.00 kg/m  GENERAL: Patient is a well appearing female in no acute distress HEENT:  Sclerae anicteric.  Oropharynx clear and moist. No ulcerations or evidence of oropharyngeal candidiasis. Neck is supple.  NODES:  No cervical, supraclavicular, or axillary lymphadenopathy palpated.  BREAST EXAM:  Right breast s/p lumpectomy and radiation, no sign of local  recurrence, left breast benign LUNGS:  Clear to auscultation bilaterally.  No wheezes or rhonchi. HEART:  Regular rate and rhythm. No murmur appreciated. ABDOMEN:  Soft, nontender.  Positive, normoactive bowel sounds. No organomegaly palpated. MSK:  No focal spinal tenderness to palpation. Full range of motion bilaterally in the upper extremities. EXTREMITIES:  No peripheral edema.   SKIN:  Clear with no obvious rashes or skin changes. No nail dyscrasia. NEURO:  Nonfocal. Well oriented.  Appropriate affect.    LABORATORY DATA:  None for this visit.  DIAGNOSTIC IMAGING:  None for this visit.      ASSESSMENT AND PLAN:  Ms.. Acevedo is a pleasant 49 y.o. female with Stage Ib right breast invasive ductal carcinoma, ER+/PR+/HER2+, diagnosed in 07/2018, treated with lumpectomy,  Adjuvant chemotherapy, maintenance trastuzumab adjuvant radiation therapy, and anti-estrogen therapy with Tamoxifen beginning in 03/2019.  She presents to the Survivorship Clinic for our initial meeting and routine follow-up post-completion of treatment for breast cancer.    1. Stage IB right breast  cancer:  Megan Acevedo is continuing to recover from definitive treatment for breast cancer. She will follow-up with her medical oncologist, Dr. Lindi Adie in 08/2019 with history and physical exam per surveillance protocol.  She is continuing on Trastuzumab every 3 weeks with good tolerance. She will continue her anti-estrogen therapy with Tamoxifen. Thus far, she is tolerating the Tamoxifen well, with minimal side effects. She was instructed to make Dr. Lindi Adie or myself aware if she begins to experience any worsening side effects of the medication and I could see her back in clinic to help manage those side effects, as needed. Her mammogram is due 09/2019; orders placed today. Today, a comprehensive survivorship care plan and treatment summary was reviewed with the patient today detailing her breast cancer diagnosis, treatment course, potential late/long-term effects of treatment, appropriate follow-up care with recommendations for the future, and patient education resources.  A copy of this summary, along with a letter will be sent to the patient's primary care provider via mail/fax/In Basket message after today's visit.    2,  Chemotherapy induced peripheral neuropathy: this is mild and stable.  No intervention needed at this point.  Will continue to monitor.    3.  Cognitive dysfunction:  This is improving.  I discussed that our neuro oncologist is available for consultation should it worsen again.  She will let me know if she needs an evaluation in the future.  4. Bone health:  She was given education on specific activities to promote bone health.  5. Cancer screening:  Due to Megan Acevedo's history and her age, she should receive screening for skin cancers, colon cancer, and gynecologic cancers.  The information and recommendations are listed on the patient's comprehensive care plan/treatment summary and were reviewed in detail with the patient.    6. Health maintenance and wellness promotion: Megan Acevedo was  encouraged to consume 5-7 servings of fruits and vegetables per day. We reviewed the "Nutrition Rainbow" handout, as well as the handout "Take Control of Your Health and Reduce Your Cancer Risk" from the [facility].  She was also encouraged to engage in moderate to vigorous exercise for 30 minutes per day most days of the week. We discussed the [employer] fitness program, which is designed for cancer survivors to help them become more physically fit after cancer treatments.  She was instructed to limit her alcohol consumption and continue to abstain from tobacco use.     7. Support services/counseling: It  is not uncommon for this period of the patient's cancer care trajectory to be one of many emotions and stressors.  We discussed how this can be increasingly difficult during the times of quarantine and social distancing due to the COVID-19 pandemic.   She was given information regarding our available services and encouraged to contact me with any questions or for help enrolling in any of our support group/programs.    Follow up instructions:    -Return to cancer center every 3 weeks for Trastuzumab; f/u with [facility] in 08/2019 and 02/2020 -Mammogram due in 08/2019 -Follow up with surgery after trastuzumab completion for port removal, then again in 11/2019 -She is welcome to return back to the Survivorship Clinic at any time; no additional follow-up needed at this time.  -Consider referral back to survivorship as a long-term survivor for continued surveillance  The patient was provided an opportunity to ask questions and all were answered. The patient agreed with the plan and demonstrated an understanding of the instructions.   Total encounter time: 30 minutes  Wilber Bihari, NP 07/03/19 10:15 AM Medical Oncology and Hematology [facility] [place], [place] [address] Tel. [phone]    Fax. [phone]  *Total Encounter Time as defined by the Centers  for Medicare and Medicaid Services includes, in addition to the face-to-face time of a patient visit (documented in the note above) non-face-to-face time: obtaining and reviewing outside history, ordering and reviewing medications, tests or procedures, care coordination (communications with other health care professionals or caregivers) and documentation in the medical record.

## 2019-07-03 NOTE — Patient Instructions (Signed)
[facility] Discharge Instructions for Patients Receiving Chemotherapy  Today you received the following Immunotherapy agents: Trastuzumab (Herceptin), Pertuzumab (Perjeta)  To help prevent nausea and vomiting after your treatment, we encourage you to take your nausea medication as directed by your MD.   If you develop nausea and vomiting that is not controlled by your nausea medication, call the clinic.   BELOW ARE SYMPTOMS THAT SHOULD BE REPORTED IMMEDIATELY:  *FEVER GREATER THAN 100.5 F  *CHILLS WITH OR WITHOUT FEVER  NAUSEA AND VOMITING THAT IS NOT CONTROLLED WITH YOUR NAUSEA MEDICATION  *UNUSUAL SHORTNESS OF BREATH  *UNUSUAL BRUISING OR BLEEDING  TENDERNESS IN MOUTH AND THROAT WITH OR WITHOUT PRESENCE OF ULCERS  *URINARY PROBLEMS  *BOWEL PROBLEMS  UNUSUAL RASH Items with * indicate a potential emergency and should be followed up as soon as possible.  Feel free to call the clinic should you have any questions or concerns. The clinic phone number is [phone].  Please show the [facility] at check-in to the Emergency Department and triage nurse.  Coronavirus (COVID-19) Are you at risk?  Are you at risk for the Coronavirus (COVID-19)?  To be considered HIGH RISK for Coronavirus (COVID-19), you have to meet the following criteria:  . Traveled to [place], [place], [place], [place] or [place]; or in the [place] to [place], [place], [place], or [place]; and have fever, cough, and shortness of breath within the last 2 weeks of travel OR . Been in close contact with a person diagnosed with COVID-19 within the last 2 weeks and have fever, cough, and shortness of breath . IF YOU DO NOT MEET THESE CRITERIA, YOU ARE CONSIDERED LOW RISK FOR COVID-19.  What to do if you are HIGH RISK for COVID-19?  Marland Kitchen If you are having a medical emergency, call 911. . Seek medical care right away. Before you go to a doctor's office, urgent care or emergency  department, call ahead and tell them about your recent travel, contact with someone diagnosed with COVID-19, and your symptoms. You should receive instructions from your physician's office regarding next steps of care.  . When you arrive at healthcare provider, tell the healthcare staff immediately you have returned from visiting [place], [place], [place], [place] or [place]; or traveled in the [place] to [place], [place], [place], or [place]; in the last two weeks or you have been in close contact with a person diagnosed with COVID-19 in the last 2 weeks.   . Tell the health care staff about your symptoms: fever, cough and shortness of breath. . After you have been seen by a medical provider, you will be either: o Tested for (COVID-19) and discharged home on quarantine except to seek medical care if symptoms worsen, and asked to  - Stay home and avoid contact with others until you get your results (4-5 days)  - Avoid travel on public transportation if possible (such as bus, train, or airplane) or o Sent to the Emergency Department by EMS for evaluation, COVID-19 testing, and possible admission depending on your condition and test results.  What to do if you are LOW RISK for COVID-19?  Reduce your risk of any infection by using the same precautions used for avoiding the common cold or flu:  Marland Kitchen Wash your hands often with soap and warm water for at least 20 seconds.  If soap and water are not readily available, use an alcohol-based hand sanitizer with at least 60% alcohol.  Marland Kitchen  If coughing or sneezing, cover your mouth and nose by coughing or sneezing into the elbow areas of your shirt or coat, into a tissue or into your sleeve (not your hands). . Avoid shaking hands with others and consider head nods or verbal greetings only. . Avoid touching your eyes, nose, or mouth with unwashed hands.  . Avoid close contact with people who are sick. . Avoid places or events with large numbers of people  in one location, like concerts or sporting events. . Carefully consider travel plans you have or are making. . If you are planning any travel outside or inside the [place], visit the CDC's Travelers' Health webpage for the latest health notices. . If you have some symptoms but not all symptoms, continue to monitor at home and seek medical attention if your symptoms worsen. . If you are having a medical emergency, call 911.   ADDITIONAL HEALTHCARE OPTIONS FOR PATIENTS  [facility] / e-Visit: [email or web address]         [facility]: [phone]  [facility]: [phone]                   [facility]: [phone]   

## 2019-07-06 ENCOUNTER — Telehealth: Payer: Self-pay | Admitting: Adult Health

## 2019-07-06 ENCOUNTER — Encounter: Payer: Self-pay | Admitting: *Deleted

## 2019-07-06 NOTE — Telephone Encounter (Signed)
Scheduled appts per 3/26 los. Pt confirmed next appt date and time.

## 2019-07-16 NOTE — Progress Notes (Signed)
Pharmacist Chemotherapy Monitoring - Follow Up Assessment    I verify that I have reviewed each item in the below checklist:  . Regimen for the patient is scheduled for the appropriate day and plan matches scheduled date. Marland Kitchen Appropriate non-routine labs are ordered dependent on drug ordered. . If applicable, additional medications reviewed and ordered per protocol based on lifetime cumulative doses and/or treatment regimen.   Plan for follow-up and/or issues identified: No . I-vent associated with next due treatment: No .   Megan Acevedo D 07/16/2019 4:33 PM

## 2019-07-22 ENCOUNTER — Other Ambulatory Visit: Payer: Self-pay

## 2019-07-22 ENCOUNTER — Inpatient Hospital Stay: Payer: 59 | Attending: Hematology and Oncology

## 2019-07-22 VITALS — BP 131/80 | HR 82 | Temp 98.3°F | Resp 18 | Wt 135.5 lb

## 2019-07-22 DIAGNOSIS — C773 Secondary and unspecified malignant neoplasm of axilla and upper limb lymph nodes: Secondary | ICD-10-CM | POA: Insufficient documentation

## 2019-07-22 DIAGNOSIS — Z90710 Acquired absence of both cervix and uterus: Secondary | ICD-10-CM | POA: Insufficient documentation

## 2019-07-22 DIAGNOSIS — C50411 Malignant neoplasm of upper-outer quadrant of right female breast: Secondary | ICD-10-CM | POA: Diagnosis not present

## 2019-07-22 DIAGNOSIS — Z170 Estrogen receptor positive status [ER+]: Secondary | ICD-10-CM | POA: Insufficient documentation

## 2019-07-22 DIAGNOSIS — Z79810 Long term (current) use of selective estrogen receptor modulators (SERMs): Secondary | ICD-10-CM | POA: Diagnosis not present

## 2019-07-22 DIAGNOSIS — Z5112 Encounter for antineoplastic immunotherapy: Secondary | ICD-10-CM | POA: Diagnosis not present

## 2019-07-22 MED ORDER — SODIUM CHLORIDE 0.9 % IV SOLN
Freq: Once | INTRAVENOUS | Status: AC
  Filled 2019-07-22: qty 250

## 2019-07-22 MED ORDER — DIPHENHYDRAMINE HCL 25 MG PO CAPS
ORAL_CAPSULE | ORAL | Status: AC
  Filled 2019-07-22: qty 2

## 2019-07-22 MED ORDER — DIPHENHYDRAMINE HCL 25 MG PO CAPS
50.0000 mg | ORAL_CAPSULE | Freq: Once | ORAL | Status: AC
  Administered 2019-07-22: 50 mg via ORAL

## 2019-07-22 MED ORDER — ACETAMINOPHEN 325 MG PO TABS
ORAL_TABLET | ORAL | Status: AC
  Filled 2019-07-22: qty 2

## 2019-07-22 MED ORDER — HEPARIN SOD (PORK) LOCK FLUSH 100 UNIT/ML IV SOLN
500.0000 [IU] | Freq: Once | INTRAVENOUS | Status: AC | PRN
  Administered 2019-07-22: 500 [IU]
  Filled 2019-07-22: qty 5

## 2019-07-22 MED ORDER — TRASTUZUMAB CHEMO 150 MG IV SOLR
6.0000 mg/kg | Freq: Once | INTRAVENOUS | Status: AC
  Administered 2019-07-22: 378 mg via INTRAVENOUS
  Filled 2019-07-22: qty 18

## 2019-07-22 MED ORDER — SODIUM CHLORIDE 0.9 % IV SOLN
420.0000 mg | Freq: Once | INTRAVENOUS | Status: AC
  Administered 2019-07-22: 420 mg via INTRAVENOUS
  Filled 2019-07-22: qty 14

## 2019-07-22 MED ORDER — ACETAMINOPHEN 325 MG PO TABS
650.0000 mg | ORAL_TABLET | Freq: Once | ORAL | Status: AC
  Administered 2019-07-22: 650 mg via ORAL

## 2019-07-22 MED ORDER — SODIUM CHLORIDE 0.9% FLUSH
10.0000 mL | INTRAVENOUS | Status: DC | PRN
  Administered 2019-07-22: 10 mL
  Filled 2019-07-22: qty 10

## 2019-07-22 NOTE — Patient Instructions (Addendum)
[facility] Discharge Instructions for Patients Receiving Chemotherapy  Today you received the following chemotherapy agents Herceptin; Perjeta  To help prevent nausea and vomiting after your treatment, we encourage you to take your nausea medication as directed   If you develop nausea and vomiting that is not controlled by your nausea medication, call the clinic.   BELOW ARE SYMPTOMS THAT SHOULD BE REPORTED IMMEDIATELY:  *FEVER GREATER THAN 100.5 F  *CHILLS WITH OR WITHOUT FEVER  NAUSEA AND VOMITING THAT IS NOT CONTROLLED WITH YOUR NAUSEA MEDICATION  *UNUSUAL SHORTNESS OF BREATH  *UNUSUAL BRUISING OR BLEEDING  TENDERNESS IN MOUTH AND THROAT WITH OR WITHOUT PRESENCE OF ULCERS  *URINARY PROBLEMS  *BOWEL PROBLEMS  UNUSUAL RASH Items with * indicate a potential emergency and should be followed up as soon as possible.  Feel free to call the clinic should you have any questions or concerns. The clinic phone number is [phone].  Please show the CHEMO ALERT CARD at check-in to the Emergency Department and triage nurse.   

## 2019-07-30 ENCOUNTER — Encounter: Payer: Self-pay | Admitting: Hematology and Oncology

## 2019-07-30 NOTE — Progress Notes (Signed)
Enrolled patient in [place] for copay assistance with Kanjnti.  Enrollment information given to [facility] for billing/claim submissions.

## 2019-07-31 ENCOUNTER — Other Ambulatory Visit: Payer: Self-pay

## 2019-07-31 ENCOUNTER — Encounter: Payer: Self-pay | Admitting: Orthopaedic Surgery

## 2019-07-31 ENCOUNTER — Ambulatory Visit (INDEPENDENT_AMBULATORY_CARE_PROVIDER_SITE_OTHER): Payer: 59 | Admitting: Orthopaedic Surgery

## 2019-07-31 DIAGNOSIS — M65311 Trigger thumb, right thumb: Secondary | ICD-10-CM

## 2019-07-31 DIAGNOSIS — M65331 Trigger finger, right middle finger: Secondary | ICD-10-CM

## 2019-07-31 MED ORDER — BUPIVACAINE HCL 0.25 % IJ SOLN
0.3300 mL | INTRAMUSCULAR | Status: AC | PRN
  Administered 2019-07-31: 08:00:00 .33 mL

## 2019-07-31 MED ORDER — BUPIVACAINE HCL 0.25 % IJ SOLN
0.3300 mL | INTRAMUSCULAR | Status: AC | PRN
  Administered 2019-07-31: .33 mL

## 2019-07-31 MED ORDER — METHYLPREDNISOLONE ACETATE 40 MG/ML IJ SUSP
13.3300 mg | INTRAMUSCULAR | Status: AC | PRN
  Administered 2019-07-31: 13.33 mg

## 2019-07-31 MED ORDER — LIDOCAINE HCL 1 % IJ SOLN
1.0000 mL | INTRAMUSCULAR | Status: AC | PRN
  Administered 2019-07-31: 1 mL

## 2019-07-31 NOTE — Progress Notes (Signed)
Office Visit Note   Patient: Megan Acevedo           Date of Birth: Aug 26, 1970           MRN: [id number] Visit Date: 07/31/2019              Requested by: [name], MD No address on file PCP: [name], MD   Assessment & Plan: Visit Diagnoses:  1. Trigger finger, right middle finger   2. Trigger finger of right thumb     Plan: Impression is right thumb and long trigger fingers with somewhat abnormal presentation.  We will inject both of these with cortisone today.  She will follow-up with [place] as needed.  Follow-Up Instructions: Return if symptoms worsen or fail to improve.   Orders:  Orders Placed This Encounter  Procedures  . Hand/UE Inj: R thumb A1  . Hand/UE Inj: R long A1   No orders of the defined types were placed in this encounter.     Procedures: Hand/UE Inj: R thumb A1 for trigger finger on 07/31/2019 8:24 AM Indications: pain Details: 25 G needle Medications: 1 mL lidocaine 1 %; 0.33 mL bupivacaine 0.25 %; 13.33 mg methylPREDNISolone acetate 40 MG/ML  Hand/UE Inj: R long A1 for trigger finger on 07/31/2019 8:24 AM Indications: pain Details: 25 G needle Medications: 1 mL lidocaine 1 %; 0.33 mL bupivacaine 0.25 %; 13.33 mg methylPREDNISolone acetate 40 MG/ML      Clinical Data: No additional findings.   Subjective: No chief complaint on file.   HPI patient is a pleasant 49 year old female who comes in today with continued right thumb and middle finger pain and slight locking.  She was seen in our office last month for these issues as well as to the left small finger and left thumb.  Both of those were injected with cortisone.  It was questionable whether she was having an actual trigger finger to the left thumb but after injection she notes that her thumb has improved quite a bit.  Pain she is having to the right thumb and long fingers is to the A1 pulley.  She has noticed stiffness and some locking.  She would like these injected with  cortisone today.  Review of Systems as detailed in HPI.  All others reviewed and are negative.   Objective: Vital Signs: There were no vitals taken for this visit.  Physical Exam well-developed well-nourished female no acute distress.  Alert and oriented x3.  Ortho Exam examination of her right hand reveals a tender and palpable nodule at the A1 pulleys of the thumb and long fingers.  She does have tightness with flexion of the fingers but no active triggering.  She is neurovascular intact distally.  Specialty Comments:  No specialty comments available.  Imaging: No new imaging   PMFS History: Patient Active Problem List   Diagnosis Date Noted  . Port-A-Cath in place 10/15/2018  . Malignant neoplasm of upper-outer quadrant of right breast in female, estrogen receptor positive ([place]) 07/23/2018   Past Medical History:  Diagnosis Date  . Cancer [facility])     History reviewed. No pertinent family history.  Past Surgical History:  Procedure Laterality Date  . ABDOMINAL HYSTERECTOMY    . BREAST LUMPECTOMY WITH RADIOACTIVE SEED AND SENTINEL LYMPH NODE BIOPSY Right 08/20/2018   Procedure: RIGHT BREAST LUMPECTOMY WITH RADIOACTIVE SEED AND RIGHT AXILLARY SENTINEL LYMPH NODE BIOPSY;  Surgeon: [name], MD;  Location: [place];  Service: General;  Laterality: Right;  . CHOLECYSTECTOMY    . PORTACATH PLACEMENT Right 08/20/2018   Procedure: INSERTION PORT-A-CATH WITH ULTRASOUND;  Surgeon: [name], MD;  Location: [place];  Service: General;  Laterality: Right;  . TUBAL LIGATION     Social History   Occupational History  . Not on file  Tobacco Use  . Smoking status: Never Smoker  . Smokeless tobacco: Never Used  Substance and Sexual Activity  . Alcohol use: Never  . Drug use: Never  . Sexual activity: Not on file

## 2019-08-06 NOTE — Progress Notes (Signed)
Pharmacist Chemotherapy Monitoring - Follow Up Assessment    I verify that I have reviewed each item in the below checklist:  . Regimen for the patient is scheduled for the appropriate day and plan matches scheduled date. Marland Kitchen Appropriate non-routine labs are ordered dependent on drug ordered. . If applicable, additional medications reviewed and ordered per protocol based on lifetime cumulative doses and/or treatment regimen.   Plan for follow-up and/or issues identified:no . I-vent associated with next due treatment: yes . MD and/or nursing notified: No  Mikaelah Trostle K 08/06/2019 9:05 AM

## 2019-08-07 ENCOUNTER — Encounter: Payer: Self-pay | Admitting: *Deleted

## 2019-08-11 NOTE — Progress Notes (Signed)
Patient Care Team: [name], MD as PCP - General (Internal Medicine) Mauro Kaufmann, RN as Oncology Nurse Navigator [name], RN as Oncology Nurse Navigator [name], MD as Consulting Physician (General Surgery) Nicholas Lose, MD as Consulting Physician (Hematology and Oncology)  DIAGNOSIS:    ICD-10-CM   1. Malignant neoplasm of upper-outer quadrant of right breast in female, estrogen receptor positive ([place])  C50.411    Z17.0     SUMMARY OF ONCOLOGIC HISTORY: Oncology History  Malignant neoplasm of upper-outer quadrant of right breast in female, estrogen receptor positive ([place])  06/26/2018 Initial Diagnosis   [place]: Screening mammogram detected focal asymmetry in the right breast, ultrasound revealed 2 cm oval mass: Biopsy revealed grade 1-2 invasive ductal carcinoma, ER 3+, PR 3+, HER-2 3+ positive T1c M0 stage Ia clinical stage   07/23/2018 Cancer Staging   Staging form: Breast, AJCC 8th Edition - Clinical stage from 07/23/2018: Stage IA (cT1c, cN0, cM0, G2, ER+, PR+, HER2+)   07/2018 Genetic Testing   Genetic testing in [place], [place], negative   08/20/2018 Surgery   Right lumpectomy Donne Hazel) [id number]): Grade 2 IDC with DCIS, 2.2 cm, margins negative, 1/1 lymph node positive with focal extranodal extension, ER 50%, PR 50%, HER-2 positive, 3+, Ki-67 10 to 15%, T2N1   09/03/2018 Cancer Staging   Staging form: Breast, AJCC 8th Edition - Pathologic: Stage IB (pT2, pN1a, cM0, G2, ER+, PR+, HER2+) - Signed by Gardenia Phlegm, NP on 09/03/2018   10/04/2018 - 05/22/2019 Chemotherapy   palonosetron (ALOXI) injection 0.25 mg, 0.25 mg, Intravenous,  Once, 6 of 6 cycles Administration: 0.25 mg (10/15/2018), 0.25 mg (11/05/2018), 0.25 mg (12/17/2018), 0.25 mg (01/07/2019), 0.25 mg (11/26/2018), 0.25 mg (09/24/2018)  pegfilgrastim-cbqv (UDENYCA) injection 6 mg, 6 mg, Subcutaneous, Once, 6 of 6 cycles. Administration: 6 mg (09/25/2018), 6 mg  (10/17/2018), 6 mg (11/06/2018), 6 mg (12/19/2018), 6 mg (01/08/2019), 6 mg (11/28/2018)  trastuzumab (HERCEPTIN) 504 mg in sodium chloride 0.9 % 250 mL chemo infusion, 8 mg/kg = 504 mg, Intravenous,  Once, 9 of 14 cycles. Dose modification: 6 mg/kg (original dose 6 mg/kg, Cycle 7, Reason: Other (see comments), Comment: Insurance will only approve Herceptin at this time.), 6 mg/kg (original dose 6 mg/kg, Cycle 12, Reason: Other (see comments), Comment: pt did not tolerate Kanjinti (itching)). Administration: 378 mg (10/15/2018), 378 mg (11/05/2018), 504 mg (09/24/2018), 378 mg (01/28/2019), 378 mg (02/18/2019), 378 mg (03/11/2019), 378 mg (04/01/2019), 378 mg (05/22/2019)  CARBOplatin (PARAPLATIN) 700 mg in sodium chloride 0.9 % 250 mL chemo infusion, 700 mg (100 % of original dose 700 mg), Intravenous,  Once, 6 of 6 cycles. Dose modification: 700 mg (original dose 700 mg, Cycle 1). Administration: 700 mg (10/15/2018), 700 mg (11/05/2018), 700 mg (12/17/2018), 700 mg (01/07/2019), 700 mg (11/26/2018), 700 mg (09/24/2018)  DOCEtaxel (TAXOTERE) 130 mg in sodium chloride 0.9 % 250 mL chemo infusion, 75 mg/m2 = 130 mg, Intravenous,  Once, 6 of 6 cycles. Administration: 130 mg (10/15/2018), 130 mg (11/05/2018), 130 mg (12/17/2018), 130 mg (01/07/2019), 130 mg (11/26/2018), 130 mg (09/24/2018)  pertuzumab (PERJETA) 420 mg in sodium chloride 0.9 % 250 mL chemo infusion, 420 mg (50 % of original dose 840 mg), Intravenous, Once, 12 of 17 cycles. Dose modification: 420 mg (original dose 840 mg, Cycle 1, Reason: Provider Judgment). Administration: 420 mg (10/15/2018), 420 mg (11/05/2018), 420 mg (12/17/2018), 420 mg (01/07/2019), 420 mg (01/28/2019), 420 mg (03/11/2019), 420 mg (04/01/2019), 420 mg (11/26/2018), 420 mg (02/18/2019), 420 mg (  04/24/2019), 420 mg (05/22/2019), 420 mg (09/24/2018)  fosaprepitant (EMEND) 150 mg   dexamethasone (DECADRON) 12 mg in sodium chloride 0.9 % 145 mL IVPB, , Intravenous,  Once, 6 of 6 cycles. Administration:   (10/15/2018),  (11/05/2018),  (12/17/2018),  (01/07/2019),  (11/26/2018),  (09/24/2018)  trastuzumab-dkst (OGIVRI) 378 mg in sodium chloride 0.9 % 250 mL chemo infusion, 6 mg/kg = 378 mg (100 % of original dose 6 mg/kg), Intravenous,  Once, 3 of 3 cycles. Dose modification: 6 mg/kg (original dose 6 mg/kg, Cycle 4, Reason: Other (see comments), Comment: Biosimilar Conversion). Administration: 378 mg (11/26/2018), 378 mg (12/17/2018), 378 mg (01/07/2019)     - 03/23/2019 Radiation Therapy   Completed at [facility]   03/2019 - 03/2024 Anti-estrogen oral therapy   Tamoxifen     CHIEF COMPLIANT: Follow-up of right breast cancer   INTERVAL HISTORY: Megan Acevedo is a 49 y.o. with above-mentioned history of HER2 positive right breast cancer who underwent a lumpectomy, adjuvant chemotherapy, radiation, and is currently on Herceptin Perjeta maintenance and antiestrogen therapy with tamoxifen. She presents to the clinic today for treatment.   ALLERGIES:  has No Known Allergies.  MEDICATIONS:  Current Outpatient Medications  Medication Sig Dispense Refill  . acetaminophen (TYLENOL) 325 MG tablet Take 650 mg by mouth every 6 (six) hours as needed.    . benzoyl peroxide-erythromycin (BENZAMYCIN) gel Apply topically 2 (two) times daily. 23.3 g 0  . diphenoxylate-atropine (LOMOTIL) 2.5-0.025 MG tablet Take 1 tablet by mouth 4 (four) times daily as needed for diarrhea or loose stools. 30 tablet 0  . LATISSE 0.03 % ophthalmic solution Place 1 application into both eyes at bedtime. Place one drop on applicator and apply evenly along the skin of the upper eyelid at base of eyelashes once daily at bedtime; repeat procedure for second eye (use a clean applicator). 3 mL 6  . lidocaine-prilocaine (EMLA) cream Apply to affected area once 30 g 3  . LORazepam (ATIVAN) 0.5 MG tablet Take 1 tablet (0.5 mg total) by mouth at bedtime as needed (Nausea or vomiting). 30 tablet 0  . Multiple Vitamins-Minerals (CENTRUM SILVER  50+WOMEN) TABS Take 1 each by mouth every morning.    Marland Kitchen omeprazole (PRILOSEC) 20 MG capsule Take 20 mg by mouth daily.    . ondansetron (ZOFRAN) 8 MG tablet Take 1 tablet (8 mg total) by mouth 2 (two) times daily as needed (Nausea or vomiting). Begin 4 days after chemotherapy. 30 tablet 1  . oxybutynin (DITROPAN) 5 MG tablet Take 5 mg by mouth 3 (three) times daily.    . prochlorperazine (COMPAZINE) 10 MG tablet Take 1 tablet (10 mg total) by mouth every 6 (six) hours as needed (Nausea or vomiting). 30 tablet 1  . tamoxifen (NOLVADEX) 10 MG tablet Take 1 tablet (10 mg total) by mouth 2 (two) times daily. 60 tablet 3   No current facility-administered medications for this visit.    PHYSICAL EXAMINATION: ECOG PERFORMANCE STATUS: 1 - Symptomatic but completely ambulatory  Vitals:   08/12/19 0855  BP: (!) 168/76  Pulse: 81  Resp: 18  Temp: 98.3 F (36.8 C)  SpO2: 100%   There were no vitals filed for this visit.  LABORATORY DATA:  I have reviewed the data as listed CMP Latest Ref Rng & Units 07/03/2019 05/22/2019 05/15/2019  Glucose 70 - 99 mg/dL 76 82 81  BUN 6 - 20 mg/dL '16 15 13  '$ Creatinine 0.44 - 1.00 mg/dL 0.79 0.71 0.75  Sodium 135 - 145  mmol/L 142 143 141  Potassium 3.5 - 5.1 mmol/L 3.8 4.0 4.1  Chloride 98 - 111 mmol/L 108 109 109  CO2 22 - 32 mmol/L '24 25 26  '$ Calcium 8.9 - 10.3 mg/dL 8.9 8.8(L) 8.8(L)  Total Protein 6.5 - 8.1 g/dL 7.1 7.3 6.8  Total Bilirubin 0.3 - 1.2 mg/dL 0.6 0.5 0.7  Alkaline Phos 38 - 126 U/L 90 84 76  AST 15 - 41 U/L 12(L) 13(L) 13(L)  ALT 0 - 44 U/L '13 13 13    '$ Lab Results  Component Value Date   WBC 4.6 08/12/2019   HGB 13.2 08/12/2019   HCT 39.8 08/12/2019   MCV 94.1 08/12/2019   PLT 224 08/12/2019   NEUTROABS 2.9 08/12/2019    ASSESSMENT & PLAN:  Malignant neoplasm of upper-outer quadrant of right breast in female, estrogen receptor positive ([place]) 08/20/2018:Right lumpectomy: Grade 2 IDC with DCIS, 2.2 cm, margins negative, 1/1 lymph  node positive with focal extranodal extension, ER 50%, PR 50%, HER-2 positive, 3+, Ki-67 10 to 15%, T2N1  Recommendation: 1.Adjuvant chemotherapy with TCH Perjeta completed 01/07/2019 followed by Herceptin Perjeta maintenance 2.Adjuvant radiation at [facility] completed 03/23/2019 3.Adjuvant antiestrogen therapy with tamoxifen x10 years 4.Adjuvant neratinib ---------------------------------------------------------------------------------------------------------------------------------------- Current treatment: Herceptin and Perjeta maintenance, adjuvant Tamoxifen Echocardiogram 05/29/2019: EF 60 to 65% 1. Arthralgias: Joint symptoms improved with lower dosage of tamoxifen. 2. Itching: Improved after treatment was switched to brand name Herceptin.  Her last treatment was on 09/02/2019. I discussed the role of neratinib as adjuvant therapy.  I provided her information. We will make a decision on this in 3 months when she comes back to see [place].   No orders of the defined types were placed in this encounter.  The patient has a good understanding of the overall plan. she agrees with it. she will call with any problems that may develop before the next visit here.  Total time spent: 30 mins including face to face time and time spent for planning, charting and coordination of care  Nicholas Lose, MD 08/12/2019  I, Cloyde Reams Dorshimer, am acting as scribe for Dr. Nicholas Lose.  I have reviewed the above documentation for accuracy and completeness, and I agree with the above.

## 2019-08-12 ENCOUNTER — Inpatient Hospital Stay: Payer: 59

## 2019-08-12 ENCOUNTER — Inpatient Hospital Stay (HOSPITAL_BASED_OUTPATIENT_CLINIC_OR_DEPARTMENT_OTHER): Payer: 59 | Admitting: Hematology and Oncology

## 2019-08-12 ENCOUNTER — Other Ambulatory Visit: Payer: Self-pay

## 2019-08-12 ENCOUNTER — Inpatient Hospital Stay: Payer: 59 | Attending: Hematology and Oncology

## 2019-08-12 DIAGNOSIS — Z95828 Presence of other vascular implants and grafts: Secondary | ICD-10-CM

## 2019-08-12 DIAGNOSIS — Z170 Estrogen receptor positive status [ER+]: Secondary | ICD-10-CM

## 2019-08-12 DIAGNOSIS — Z923 Personal history of irradiation: Secondary | ICD-10-CM | POA: Diagnosis not present

## 2019-08-12 DIAGNOSIS — Z79899 Other long term (current) drug therapy: Secondary | ICD-10-CM | POA: Insufficient documentation

## 2019-08-12 DIAGNOSIS — C50411 Malignant neoplasm of upper-outer quadrant of right female breast: Secondary | ICD-10-CM

## 2019-08-12 DIAGNOSIS — Z5112 Encounter for antineoplastic immunotherapy: Secondary | ICD-10-CM | POA: Diagnosis not present

## 2019-08-12 DIAGNOSIS — Z79810 Long term (current) use of selective estrogen receptor modulators (SERMs): Secondary | ICD-10-CM | POA: Diagnosis not present

## 2019-08-12 LAB — CBC WITH DIFFERENTIAL (CANCER CENTER ONLY)
Abs Immature Granulocytes: 0.01 10*3/uL (ref 0.00–0.07)
Basophils Absolute: 0 10*3/uL (ref 0.0–0.1)
Basophils Relative: 1 %
Eosinophils Absolute: 0.1 10*3/uL (ref 0.0–0.5)
Eosinophils Relative: 2 %
HCT: 39.8 % (ref 36.0–46.0)
Hemoglobin: 13.2 g/dL (ref 12.0–15.0)
Immature Granulocytes: 0 %
Lymphocytes Relative: 28 %
Lymphs Abs: 1.3 10*3/uL (ref 0.7–4.0)
MCH: 31.2 pg (ref 26.0–34.0)
MCHC: 33.2 g/dL (ref 30.0–36.0)
MCV: 94.1 fL (ref 80.0–100.0)
Monocytes Absolute: 0.3 10*3/uL (ref 0.1–1.0)
Monocytes Relative: 7 %
Neutro Abs: 2.9 10*3/uL (ref 1.7–7.7)
Neutrophils Relative %: 62 %
Platelet Count: 224 10*3/uL (ref 150–400)
RBC: 4.23 MIL/uL (ref 3.87–5.11)
RDW: 11.9 % (ref 11.5–15.5)
WBC Count: 4.6 10*3/uL (ref 4.0–10.5)
nRBC: 0 % (ref 0.0–0.2)

## 2019-08-12 LAB — CMP (CANCER CENTER ONLY)
ALT: 16 U/L (ref 0–44)
AST: 15 U/L (ref 15–41)
Albumin: 3.9 g/dL (ref 3.5–5.0)
Alkaline Phosphatase: 93 U/L (ref 38–126)
Anion gap: 9 (ref 5–15)
BUN: 14 mg/dL (ref 6–20)
CO2: 26 mmol/L (ref 22–32)
Calcium: 8.9 mg/dL (ref 8.9–10.3)
Chloride: 109 mmol/L (ref 98–111)
Creatinine: 0.7 mg/dL (ref 0.44–1.00)
GFR, Est AFR Am: >60 mL/min (ref >60)
GFR, Estimated: >60 mL/min (ref >60)
Glucose, Bld: 89 mg/dL (ref 70–99)
Potassium: 4.1 mmol/L (ref 3.5–5.1)
Sodium: 144 mmol/L (ref 135–145)
Total Bilirubin: 0.4 mg/dL (ref 0.3–1.2)
Total Protein: 6.9 g/dL (ref 6.5–8.1)

## 2019-08-12 MED ORDER — ACETAMINOPHEN 325 MG PO TABS
ORAL_TABLET | ORAL | Status: AC
  Filled 2019-08-12: qty 2

## 2019-08-12 MED ORDER — SODIUM CHLORIDE 0.9 % IV SOLN
420.0000 mg | Freq: Once | INTRAVENOUS | Status: AC
  Administered 2019-08-12: 420 mg via INTRAVENOUS
  Filled 2019-08-12: qty 14

## 2019-08-12 MED ORDER — DIPHENHYDRAMINE HCL 25 MG PO CAPS
50.0000 mg | ORAL_CAPSULE | Freq: Once | ORAL | Status: AC
  Administered 2019-08-12: 50 mg via ORAL

## 2019-08-12 MED ORDER — HEPARIN SOD (PORK) LOCK FLUSH 100 UNIT/ML IV SOLN
500.0000 [IU] | Freq: Once | INTRAVENOUS | Status: AC | PRN
  Administered 2019-08-12: 500 [IU]
  Filled 2019-08-12: qty 5

## 2019-08-12 MED ORDER — SODIUM CHLORIDE 0.9% FLUSH
10.0000 mL | INTRAVENOUS | Status: DC | PRN
  Administered 2019-08-12: 10 mL
  Filled 2019-08-12: qty 10

## 2019-08-12 MED ORDER — DIPHENHYDRAMINE HCL 25 MG PO CAPS
ORAL_CAPSULE | ORAL | Status: AC
  Filled 2019-08-12: qty 1

## 2019-08-12 MED ORDER — SODIUM CHLORIDE 0.9 % IV SOLN
Freq: Once | INTRAVENOUS | Status: AC
  Filled 2019-08-12: qty 250

## 2019-08-12 MED ORDER — SODIUM CHLORIDE 0.9% FLUSH
10.0000 mL | Freq: Once | INTRAVENOUS | Status: AC
  Administered 2019-08-12: 10 mL
  Filled 2019-08-12: qty 10

## 2019-08-12 MED ORDER — ACETAMINOPHEN 325 MG PO TABS
650.0000 mg | ORAL_TABLET | Freq: Once | ORAL | Status: AC
  Administered 2019-08-12: 650 mg via ORAL

## 2019-08-12 MED ORDER — TRASTUZUMAB CHEMO 150 MG IV SOLR
6.0000 mg/kg | Freq: Once | INTRAVENOUS | Status: AC
  Administered 2019-08-12: 378 mg via INTRAVENOUS
  Filled 2019-08-12: qty 18

## 2019-08-12 NOTE — Patient Instructions (Signed)
[facility] Discharge Instructions for Patients Receiving Chemotherapy  Today you received the following chemotherapy agents Trastuzumab (HERCEPTIN) & Pertuzumab (PERJETA).  To help prevent nausea and vomiting after your treatment, we encourage you to take your nausea medication as prescribed.   If you develop nausea and vomiting that is not controlled by your nausea medication, call the clinic.   BELOW ARE SYMPTOMS THAT SHOULD BE REPORTED IMMEDIATELY:  *FEVER GREATER THAN 100.5 F  *CHILLS WITH OR WITHOUT FEVER  NAUSEA AND VOMITING THAT IS NOT CONTROLLED WITH YOUR NAUSEA MEDICATION  *UNUSUAL SHORTNESS OF BREATH  *UNUSUAL BRUISING OR BLEEDING  TENDERNESS IN MOUTH AND THROAT WITH OR WITHOUT PRESENCE OF ULCERS  *URINARY PROBLEMS  *BOWEL PROBLEMS  UNUSUAL RASH Items with * indicate a potential emergency and should be followed up as soon as possible.  Feel free to call the clinic should you have any questions or concerns. The clinic phone number is [phone].  Please show the [facility] at check-in to the Emergency Department and triage nurse.

## 2019-08-12 NOTE — Patient Instructions (Signed)

## 2019-08-12 NOTE — Assessment & Plan Note (Signed)
08/20/2018:Right lumpectomy: Grade 2 IDC with DCIS, 2.2 cm, margins negative, 1/1 lymph node positive with focal extranodal extension, ER 50%, PR 50%, HER-2 positive, 3+, Ki-67 10 to 15%, T2N1  Recommendation: 1.Adjuvant chemotherapy with TCH Perjeta completed 01/07/2019 followed by Herceptin Perjeta maintenance 2.Adjuvant radiation at [facility] completed 03/23/2019 3.Adjuvant antiestrogen therapy with tamoxifen x10 years 4.Adjuvant neratinib ---------------------------------------------------------------------------------------------------------------------------------------- Current treatment: Herceptin and Perjeta maintenance, adjuvant Tamoxifen Echocardiogram 05/29/2019: EF 60 to 65% 1. Arthralgias: ? Related to tamoxifen.  She will decrease to 10m daily and we will see if this helps. 2. Itching: Improved after treatment was switched to brand name Herceptin.  Her last treatment was on 09/02/2019. We will discussed the role of adjuvant neratinib at a later time.

## 2019-08-13 ENCOUNTER — Telehealth: Payer: Self-pay | Admitting: Hematology and Oncology

## 2019-08-13 NOTE — Telephone Encounter (Signed)
Scheduled per 05/05 los, patient has been called and voicemail was left. 

## 2019-08-27 ENCOUNTER — Encounter: Payer: Self-pay | Admitting: Hematology and Oncology

## 2019-08-27 NOTE — Progress Notes (Signed)
Called patient to advise of status of paid claim by [employer] for 04/24/19 date of service.  Left voicemail with my contact name and number should she have any questions.

## 2019-08-27 NOTE — Progress Notes (Signed)
Pharmacist Chemotherapy Monitoring - Follow Up Assessment    I verify that I have reviewed each item in the below checklist:  . Regimen for the patient is scheduled for the appropriate day and plan matches scheduled date. Marland Kitchen Appropriate non-routine labs are ordered dependent on drug ordered. . If applicable, additional medications reviewed and ordered per protocol based on lifetime cumulative doses and/or treatment regimen.   Plan for follow-up and/or issues identified: No . I-vent associated with next due treatment: No . MD and/or nursing notified: No  Megan Acevedo K 08/27/2019 11:11 AM

## 2019-09-02 ENCOUNTER — Encounter: Payer: Self-pay | Admitting: *Deleted

## 2019-09-02 ENCOUNTER — Inpatient Hospital Stay: Payer: 59

## 2019-09-02 ENCOUNTER — Other Ambulatory Visit: Payer: Self-pay

## 2019-09-02 VITALS — BP 158/80 | HR 91 | Temp 98.0°F | Resp 16 | Ht 65.0 in | Wt 135.0 lb

## 2019-09-02 DIAGNOSIS — C50411 Malignant neoplasm of upper-outer quadrant of right female breast: Secondary | ICD-10-CM

## 2019-09-02 DIAGNOSIS — Z5112 Encounter for antineoplastic immunotherapy: Secondary | ICD-10-CM | POA: Diagnosis not present

## 2019-09-02 DIAGNOSIS — Z170 Estrogen receptor positive status [ER+]: Secondary | ICD-10-CM | POA: Diagnosis not present

## 2019-09-02 DIAGNOSIS — Z79810 Long term (current) use of selective estrogen receptor modulators (SERMs): Secondary | ICD-10-CM | POA: Diagnosis not present

## 2019-09-02 DIAGNOSIS — Z79899 Other long term (current) drug therapy: Secondary | ICD-10-CM | POA: Diagnosis not present

## 2019-09-02 DIAGNOSIS — Z923 Personal history of irradiation: Secondary | ICD-10-CM | POA: Diagnosis not present

## 2019-09-02 MED ORDER — ACETAMINOPHEN 325 MG PO TABS
650.0000 mg | ORAL_TABLET | Freq: Once | ORAL | Status: AC
  Administered 2019-09-02: 650 mg via ORAL

## 2019-09-02 MED ORDER — SODIUM CHLORIDE 0.9% FLUSH
10.0000 mL | INTRAVENOUS | Status: DC | PRN
  Administered 2019-09-02: 10 mL
  Filled 2019-09-02: qty 10

## 2019-09-02 MED ORDER — HEPARIN SOD (PORK) LOCK FLUSH 100 UNIT/ML IV SOLN
500.0000 [IU] | Freq: Once | INTRAVENOUS | Status: AC | PRN
  Administered 2019-09-02: 500 [IU]
  Filled 2019-09-02: qty 5

## 2019-09-02 MED ORDER — SODIUM CHLORIDE 0.9 % IV SOLN
420.0000 mg | Freq: Once | INTRAVENOUS | Status: AC
  Administered 2019-09-02: 420 mg via INTRAVENOUS
  Filled 2019-09-02: qty 14

## 2019-09-02 MED ORDER — SODIUM CHLORIDE 0.9 % IV SOLN
Freq: Once | INTRAVENOUS | Status: AC
  Filled 2019-09-02: qty 250

## 2019-09-02 MED ORDER — DIPHENHYDRAMINE HCL 25 MG PO CAPS
50.0000 mg | ORAL_CAPSULE | Freq: Once | ORAL | Status: AC
  Administered 2019-09-02: 50 mg via ORAL

## 2019-09-02 MED ORDER — TRASTUZUMAB CHEMO 150 MG IV SOLR
6.0000 mg/kg | Freq: Once | INTRAVENOUS | Status: AC
  Administered 2019-09-02: 378 mg via INTRAVENOUS
  Filled 2019-09-02: qty 18

## 2019-09-02 MED ORDER — ACETAMINOPHEN 325 MG PO TABS
ORAL_TABLET | ORAL | Status: AC
  Filled 2019-09-02: qty 2

## 2019-09-02 MED ORDER — DIPHENHYDRAMINE HCL 25 MG PO CAPS
ORAL_CAPSULE | ORAL | Status: AC
  Filled 2019-09-02: qty 2

## 2019-09-02 NOTE — Patient Instructions (Signed)
[facility] Discharge Instructions for Patients Receiving Chemotherapy  Today you received the following chemotherapy agents: Herceptin, Perjeta  To help prevent nausea and vomiting after your treatment, we encourage you to take your nausea medication as directed.   If you develop nausea and vomiting that is not controlled by your nausea medication, call the clinic.   BELOW ARE SYMPTOMS THAT SHOULD BE REPORTED IMMEDIATELY:  *FEVER GREATER THAN 100.5 F  *CHILLS WITH OR WITHOUT FEVER  NAUSEA AND VOMITING THAT IS NOT CONTROLLED WITH YOUR NAUSEA MEDICATION  *UNUSUAL SHORTNESS OF BREATH  *UNUSUAL BRUISING OR BLEEDING  TENDERNESS IN MOUTH AND THROAT WITH OR WITHOUT PRESENCE OF ULCERS  *URINARY PROBLEMS  *BOWEL PROBLEMS  UNUSUAL RASH Items with * indicate a potential emergency and should be followed up as soon as possible.  Feel free to call the clinic should you have any questions or concerns. The clinic phone number is [phone].  Please show the CHEMO ALERT CARD at check-in to the Emergency Department and triage nurse.   

## 2019-09-11 DIAGNOSIS — C50411 Malignant neoplasm of upper-outer quadrant of right female breast: Secondary | ICD-10-CM | POA: Diagnosis not present

## 2019-09-14 ENCOUNTER — Other Ambulatory Visit: Payer: Self-pay | Admitting: *Deleted

## 2019-09-14 ENCOUNTER — Encounter: Payer: Self-pay | Admitting: Hematology and Oncology

## 2019-09-14 DIAGNOSIS — C50411 Malignant neoplasm of upper-outer quadrant of right female breast: Secondary | ICD-10-CM

## 2019-09-14 MED ORDER — TAMOXIFEN CITRATE 10 MG PO TABS: 10.0000 mg | ORAL_TABLET | Freq: Two times a day (BID) | ORAL | 3 refills | Status: DC

## 2019-09-16 ENCOUNTER — Ambulatory Visit
Admission: RE | Admit: 2019-09-16 | Discharge: 2019-09-16 | Disposition: A | Discharge status: Home / Self Care | Payer: 59 | Source: Ambulatory Visit | Attending: Adult Health | Admitting: Adult Health

## 2019-09-16 ENCOUNTER — Other Ambulatory Visit: Payer: Self-pay

## 2019-09-16 DIAGNOSIS — Z170 Estrogen receptor positive status [ER+]: Secondary | ICD-10-CM

## 2019-09-16 DIAGNOSIS — Z853 Personal history of malignant neoplasm of breast: Secondary | ICD-10-CM | POA: Diagnosis not present

## 2019-09-16 DIAGNOSIS — C50411 Malignant neoplasm of upper-outer quadrant of right female breast: Secondary | ICD-10-CM

## 2019-09-16 DIAGNOSIS — R922 Inconclusive mammogram: Secondary | ICD-10-CM | POA: Diagnosis not present

## 2019-10-06 DIAGNOSIS — C50411 Malignant neoplasm of upper-outer quadrant of right female breast: Secondary | ICD-10-CM | POA: Diagnosis not present

## 2019-11-12 NOTE — Progress Notes (Signed)
Patient Care Team: [name], MD as PCP - General (Internal Medicine) Mauro Kaufmann, RN as Oncology Nurse Navigator [name], RN as Oncology Nurse Navigator [name], MD as Consulting Physician (General Surgery) Nicholas Lose, MD as Consulting Physician (Hematology and Oncology)  DIAGNOSIS:    ICD-10-CM   1. Malignant neoplasm of upper-outer quadrant of right breast in female, estrogen receptor positive ([place])  C50.411 CBC with Differential ([place] Only)   Z17.0 [place] ([place] only)    SUMMARY OF ONCOLOGIC HISTORY: Oncology History  Malignant neoplasm of upper-outer quadrant of right breast in female, estrogen receptor positive ([place])  06/26/2018 Initial Diagnosis   [place]: Screening mammogram detected focal asymmetry in the right breast, ultrasound revealed 2 cm oval mass: Biopsy revealed grade 1-2 invasive ductal carcinoma, ER 3+, PR 3+, HER-2 3+ positive T1c M0 stage Ia clinical stage   07/23/2018 Cancer Staging   Staging form: Breast, AJCC 8th Edition - Clinical stage from 07/23/2018: Stage IA (cT1c, cN0, cM0, G2, ER+, PR+, HER2+)   07/2018 Genetic Testing   Genetic testing in [place], [place], negative   08/20/2018 Surgery   Right lumpectomy Donne Hazel) [phone]): Grade 2 IDC with DCIS, 2.2 cm, margins negative, 1/1 lymph node positive with focal extranodal extension, ER 50%, PR 50%, HER-2 positive, 3+, Ki-67 10 to 15%, T2N1   09/03/2018 Cancer Staging   Staging form: Breast, AJCC 8th Edition - Pathologic: Stage IB (pT2, pN1a, cM0, G2, ER+, PR+, HER2+) - Signed by Gardenia Phlegm, NP on 09/03/2018   10/04/2018 - 05/22/2019 Chemotherapy   palonosetron (ALOXI) injection 0.25 mg, 0.25 mg, Intravenous,  Once, 6 of 6 cycles Administration: 0.25 mg (10/15/2018), 0.25 mg (11/05/2018), 0.25 mg (12/17/2018), 0.25 mg (01/07/2019), 0.25 mg (11/26/2018), 0.25 mg (09/24/2018)  pegfilgrastim-cbqv (UDENYCA) injection 6 mg, 6 mg, Subcutaneous, Once,  6 of 6 cycles. Administration: 6 mg (09/25/2018), 6 mg (10/17/2018), 6 mg (11/06/2018), 6 mg (12/19/2018), 6 mg (01/08/2019), 6 mg (11/28/2018)  trastuzumab (HERCEPTIN) 504 mg in sodium chloride 0.9 % 250 mL chemo infusion, 8 mg/kg = 504 mg, Intravenous,  Once, 9 of 14 cycles. Dose modification: 6 mg/kg (original dose 6 mg/kg, Cycle 7, Reason: Other (see comments), Comment: Insurance will only approve Herceptin at this time.), 6 mg/kg (original dose 6 mg/kg, Cycle 12, Reason: Other (see comments), Comment: pt did not tolerate Kanjinti (itching)). Administration: 378 mg (10/15/2018), 378 mg (11/05/2018), 504 mg (09/24/2018), 378 mg (01/28/2019), 378 mg (02/18/2019), 378 mg (03/11/2019), 378 mg (04/01/2019), 378 mg (05/22/2019)  CARBOplatin (PARAPLATIN) 700 mg in sodium chloride 0.9 % 250 mL chemo infusion, 700 mg (100 % of original dose 700 mg), Intravenous,  Once, 6 of 6 cycles. Dose modification: 700 mg (original dose 700 mg, Cycle 1). Administration: 700 mg (10/15/2018), 700 mg (11/05/2018), 700 mg (12/17/2018), 700 mg (01/07/2019), 700 mg (11/26/2018), 700 mg (09/24/2018)  DOCEtaxel (TAXOTERE) 130 mg in sodium chloride 0.9 % 250 mL chemo infusion, 75 mg/m2 = 130 mg, Intravenous,  Once, 6 of 6 cycles. Administration: 130 mg (10/15/2018), 130 mg (11/05/2018), 130 mg (12/17/2018), 130 mg (01/07/2019), 130 mg (11/26/2018), 130 mg (09/24/2018)  pertuzumab (PERJETA) 420 mg in sodium chloride 0.9 % 250 mL chemo infusion, 420 mg (50 % of original dose 840 mg), Intravenous, Once, 12 of 17 cycles. Dose modification: 420 mg (original dose 840 mg, Cycle 1, Reason: Provider Judgment). Administration: 420 mg (10/15/2018), 420 mg (11/05/2018), 420 mg (12/17/2018), 420 mg (01/07/2019), 420 mg (01/28/2019), 420 mg (03/11/2019), 420 mg (04/01/2019),  420 mg (11/26/2018), 420 mg (02/18/2019), 420 mg (04/24/2019), 420 mg (05/22/2019), 420 mg (09/24/2018)  fosaprepitant (EMEND) 150 mg   dexamethasone (DECADRON) 12 mg in sodium chloride 0.9 % 145 mL IVPB, ,  Intravenous,  Once, 6 of 6 cycles. Administration:  (10/15/2018),  (11/05/2018),  (12/17/2018),  (01/07/2019),  (11/26/2018),  (09/24/2018)  trastuzumab-dkst (OGIVRI) 378 mg in sodium chloride 0.9 % 250 mL chemo infusion, 6 mg/kg = 378 mg (100 % of original dose 6 mg/kg), Intravenous,  Once, 3 of 3 cycles. Dose modification: 6 mg/kg (original dose 6 mg/kg, Cycle 4, Reason: Other (see comments), Comment: Biosimilar Conversion). Administration: 378 mg (11/26/2018), 378 mg (12/17/2018), 378 mg (01/07/2019)     - 03/23/2019 Radiation Therapy   Completed at [facility]   03/2019 - 03/2024 Anti-estrogen oral therapy   Tamoxifen     CHIEF COMPLIANT: Follow-up of right breast cancer on tamoxifen  INTERVAL HISTORY: Megan Acevedo is a 49 y.o. with above-mentioned history of HER-2 positive right breast cancer who underwent alumpectomy,adjuvant chemotherapy, radiation, Herceptin Perjeta maintenance, and is currently on antiestrogen therapy with tamoxifen.She presents to the clinic todayfor treatment. She just returned from a 3-week vacation down at the [facility] and [employer]  She is accompanied today by her daughter.  She is interested in starting neratinib today.  ALLERGIES:  has No Known Allergies.  MEDICATIONS:  Current Outpatient Medications  Medication Sig Dispense Refill  . acetaminophen (TYLENOL) 325 MG tablet Take 650 mg by mouth every 6 (six) hours as needed.    . benzoyl peroxide-erythromycin (BENZAMYCIN) gel Apply topically 2 (two) times daily. 23.3 g 0  . diphenoxylate-atropine (LOMOTIL) 2.5-0.025 MG tablet Take 1 tablet by mouth 4 (four) times daily as needed for diarrhea or loose stools. 30 tablet 0  . LATISSE 0.03 % ophthalmic solution Place 1 application into both eyes at bedtime. Place one drop on applicator and apply evenly along the skin of the upper eyelid at base of eyelashes once daily at bedtime; repeat procedure for second eye (use a clean applicator). 3 mL 6  .  lidocaine-prilocaine (EMLA) cream Apply to affected area once 30 g 3  . LORazepam (ATIVAN) 0.5 MG tablet Take 1 tablet (0.5 mg total) by mouth at bedtime as needed (Nausea or vomiting). 30 tablet 0  . Multiple Vitamins-Minerals (CENTRUM SILVER 50+WOMEN) TABS Take 1 each by mouth every morning.    . Neratinib Maleate (NERLYNX) 40 MG tablet Take 6 tablets (240 mg total) by mouth daily. Take with food. 180 tablet 1  . omeprazole (PRILOSEC) 20 MG capsule Take 20 mg by mouth daily.    . ondansetron (ZOFRAN) 8 MG tablet Take 1 tablet (8 mg total) by mouth 2 (two) times daily as needed (Nausea or vomiting). Begin 4 days after chemotherapy. 30 tablet 1  . oxybutynin (DITROPAN) 5 MG tablet Take 5 mg by mouth 3 (three) times daily.    . prochlorperazine (COMPAZINE) 10 MG tablet Take 1 tablet (10 mg total) by mouth every 6 (six) hours as needed (Nausea or vomiting). 30 tablet 1  . tamoxifen (NOLVADEX) 10 MG tablet Take 1 tablet (10 mg total) by mouth 2 (two) times daily. 60 tablet 3   No current facility-administered medications for this visit.    PHYSICAL EXAMINATION: ECOG PERFORMANCE STATUS: 1 - Symptomatic but completely ambulatory  Vitals:   11/13/19 1137  BP: (!) 147/97  Pulse: 79  Resp: 20  Temp: 98.9 F (37.2 C)  SpO2: 100%   Filed Weights  11/13/19 1137  Weight: 134 lb 9.6 oz (61.1 kg)      LABORATORY DATA:  I have reviewed the data as listed CMP Latest Ref Rng & Units 08/12/2019 07/03/2019 05/22/2019  Glucose 70 - 99 mg/dL 89 76 82  BUN 6 - 20 mg/dL _0 Creatinine 0.44 - 1.00 mg/dL 0.70 0.79 0.71  Sodium 135 - 145 mmol/L 144 142 143  Potassium 3.5 - 5.1 mmol/L 4.1 3.8 4.0  Chloride 98 - 111 mmol/L 109 108 109  CO2 22 - 32 mmol/L _1 Calcium 8.9 - 10.3 mg/dL 8.9 8.9 8.8(L)  Total Protein 6.5 - 8.1 g/dL 6.9 7.1 7.3  Total Bilirubin 0.3 - 1.2 mg/dL 0.4 0.6 0.5  Alkaline Phos 38 - 126 U/L 93 90 84  AST 15 - 41 U/L 15 12(L) 13(L)  ALT 0 - 44 U/L _2 Lab  Results  Component Value Date   WBC 4.6 08/12/2019   HGB 13.2 08/12/2019   HCT 39.8 08/12/2019   MCV 94.1 08/12/2019   PLT 224 08/12/2019   NEUTROABS 2.9 08/12/2019    ASSESSMENT & PLAN:  Malignant neoplasm of upper-outer quadrant of right breast in female, estrogen receptor positive ([place]) 08/20/2018:Right lumpectomy: Grade 2 IDC with DCIS, 2.2 cm, margins negative, 1/1 lymph node positive with focal extranodal extension, ER 50%, PR 50%, HER-2 positive, 3+, Ki-67 10 to 15%, T2N1  Recommendation: 1.Adjuvant chemotherapy with TCH Perjeta completed 01/07/2019 followed by Herceptin Perjeta maintenance 2.Adjuvant radiation at [facility] completed 03/23/2019 3.Adjuvant antiestrogen therapy with tamoxifen x10 years 4.Adjuvant neratinib ---------------------------------------------------------------------------------------------------------------------------------------- Current treatment:adjuvant Tamoxifen 10 mg daily Echocardiogram 05/29/2019: EF 60 to 65% 1. Arthralgias: Joint symptoms improved with lower dosage of tamoxifen.   Neratinib counseling: Neratinib is up and HER-2 inhibitor that is currently approved for treatment of HER-2 positive breast cancer after conclusion of one year of Herceptin treatment. Based on the studies it does appear to have a progression free survival difference of 2% absolute value. The starting dose of the pill is 120 mg once daily. The greatest side effect of this medication is diarrhea. Patient will need to be on prophylactic antidiarrheal medications. Days 1 to 14: Loperamide 4 mg orally 3 times daily Days 15 to 56: Loperamide 4 mg orally twice daily Days 57 to 365: Loperamide 4 mg as needed (maximum: 16 mg/day) During the treatment will have to monitor the liver functions. Apart from the diarrhea other side effects include fatigue 27%, skin rash 18%, muscle spasms in 11%.  We will start her on 3 tablets daily and then weekly she will increase it by  1 tablet.  If she starts getting severe diarrhea then we will drop the dosage and then keep her at that dose level. I sent the prescription to the pharmacy and the other oral pharmacist will be in touch with her.  Recently she went on a 3-week long RV trip to [facility] and [employer]. Return to clinic in 3 weeks with labs and follow-up.   Orders Placed This Encounter  Procedures  . CBC with Differential (Cancer Center Only)    Standing Status:   Future    Standing Expiration Date:   11/12/2020  . CMP ([place] only)    Standing Status:   Future    Standing Expiration Date:   11/12/2020   The patient has a good understanding of the overall plan. she agrees with it. she will call with any problems that may develop before  the next visit here.  Total time spent: 30 mins including face to face time and time spent for planning, charting and coordination of care  Nicholas Lose, MD 11/13/2019  I, Cloyde Reams Dorshimer, am acting as scribe for Dr. Nicholas Lose.  I have reviewed the above documentation for accuracy and completeness, and I agree with the above.

## 2019-11-13 ENCOUNTER — Other Ambulatory Visit: Payer: Self-pay

## 2019-11-13 ENCOUNTER — Inpatient Hospital Stay: Payer: 59 | Attending: Hematology and Oncology | Admitting: Hematology and Oncology

## 2019-11-13 DIAGNOSIS — Z170 Estrogen receptor positive status [ER+]: Secondary | ICD-10-CM | POA: Insufficient documentation

## 2019-11-13 DIAGNOSIS — C50411 Malignant neoplasm of upper-outer quadrant of right female breast: Secondary | ICD-10-CM | POA: Insufficient documentation

## 2019-11-13 DIAGNOSIS — Z79899 Other long term (current) drug therapy: Secondary | ICD-10-CM | POA: Diagnosis not present

## 2019-11-13 DIAGNOSIS — Z923 Personal history of irradiation: Secondary | ICD-10-CM | POA: Diagnosis not present

## 2019-11-13 DIAGNOSIS — Z79810 Long term (current) use of selective estrogen receptor modulators (SERMs): Secondary | ICD-10-CM | POA: Insufficient documentation

## 2019-11-13 DIAGNOSIS — Z9221 Personal history of antineoplastic chemotherapy: Secondary | ICD-10-CM | POA: Diagnosis not present

## 2019-11-13 MED ORDER — NERATINIB MALEATE 40 MG PO TABS: 240.0000 mg | ORAL_TABLET | Freq: Every day | ORAL | 1 refills | Status: DC

## 2019-11-13 NOTE — Assessment & Plan Note (Signed)
08/20/2018:Right lumpectomy: Grade 2 IDC with DCIS, 2.2 cm, margins negative, 1/1 lymph node positive with focal extranodal extension, ER 50%, PR 50%, HER-2 positive, 3+, Ki-67 10 to 15%, T2N1  Recommendation: 1.Adjuvant chemotherapy with TCH Perjeta completed 01/07/2019 followed by Herceptin Perjeta maintenance 2.Adjuvant radiation at [facility] completed 03/23/2019 3.Adjuvant antiestrogen therapy with tamoxifen x10 years 4.Adjuvant neratinib ---------------------------------------------------------------------------------------------------------------------------------------- Current treatment:adjuvant Tamoxifen Echocardiogram 05/29/2019: EF 60 to 65% 1. Arthralgias: Joint symptoms improved with lower dosage of tamoxifen. 2. Itching: Improved after treatment was switched to brand name Herceptin.  I discussed the role of neratinib as adjuvant therapy.  I provided her information. We will make a decision on this in 3 months when she comes back to see [place].

## 2019-11-16 ENCOUNTER — Telehealth: Payer: Self-pay | Admitting: Hematology and Oncology

## 2019-11-16 NOTE — Telephone Encounter (Signed)
Scheduled appts per 8/6 los. Pt confirmed appt date and time.

## 2019-11-26 DIAGNOSIS — Z20828 Contact with and (suspected) exposure to other viral communicable diseases: Secondary | ICD-10-CM | POA: Diagnosis not present

## 2019-12-01 NOTE — Progress Notes (Signed)
Patient Care Team: [name], MD as PCP - General (Internal Medicine) Mauro Kaufmann, RN as Oncology Nurse Navigator [name], RN as Oncology Nurse Navigator [name], MD as Consulting Physician (General Surgery) Nicholas Lose, MD as Consulting Physician (Hematology and Oncology)  DIAGNOSIS:    ICD-10-CM   1. Malignant neoplasm of upper-outer quadrant of right breast in female, estrogen receptor positive ([place])  C50.411    Z17.0     SUMMARY OF ONCOLOGIC HISTORY: Oncology History  Malignant neoplasm of upper-outer quadrant of right breast in female, estrogen receptor positive ([place])  06/26/2018 Initial Diagnosis   [place]: Screening mammogram detected focal asymmetry in the right breast, ultrasound revealed 2 cm oval mass: Biopsy revealed grade 1-2 invasive ductal carcinoma, ER 3+, PR 3+, HER-2 3+ positive T1c M0 stage Ia clinical stage   07/23/2018 Cancer Staging   Staging form: Breast, AJCC 8th Edition - Clinical stage from 07/23/2018: Stage IA (cT1c, cN0, cM0, G2, ER+, PR+, HER2+)   07/2018 Genetic Testing   Genetic testing in [place], [place], negative   08/20/2018 Surgery   Right lumpectomy Donne Hazel) [phone]): Grade 2 IDC with DCIS, 2.2 cm, margins negative, 1/1 lymph node positive with focal extranodal extension, ER 50%, PR 50%, HER-2 positive, 3+, Ki-67 10 to 15%, T2N1   09/03/2018 Cancer Staging   Staging form: Breast, AJCC 8th Edition - Pathologic: Stage IB (pT2, pN1a, cM0, G2, ER+, PR+, HER2+) - Signed by Gardenia Phlegm, NP on 09/03/2018   10/04/2018 - 05/22/2019 Chemotherapy   palonosetron (ALOXI) injection 0.25 mg, 0.25 mg, Intravenous,  Once, 6 of 6 cycles Administration: 0.25 mg (10/15/2018), 0.25 mg (11/05/2018), 0.25 mg (12/17/2018), 0.25 mg (01/07/2019), 0.25 mg (11/26/2018), 0.25 mg (09/24/2018)  pegfilgrastim-cbqv (UDENYCA) injection 6 mg, 6 mg, Subcutaneous, Once, 6 of 6 cycles. Administration: 6 mg (09/25/2018), 6 mg  (10/17/2018), 6 mg (11/06/2018), 6 mg (12/19/2018), 6 mg (01/08/2019), 6 mg (11/28/2018)  trastuzumab (HERCEPTIN) 504 mg in sodium chloride 0.9 % 250 mL chemo infusion, 8 mg/kg = 504 mg, Intravenous,  Once, 9 of 14 cycles. Dose modification: 6 mg/kg (original dose 6 mg/kg, Cycle 7, Reason: Other (see comments), Comment: Insurance will only approve Herceptin at this time.), 6 mg/kg (original dose 6 mg/kg, Cycle 12, Reason: Other (see comments), Comment: pt did not tolerate Kanjinti (itching)). Administration: 378 mg (10/15/2018), 378 mg (11/05/2018), 504 mg (09/24/2018), 378 mg (01/28/2019), 378 mg (02/18/2019), 378 mg (03/11/2019), 378 mg (04/01/2019), 378 mg (05/22/2019)  CARBOplatin (PARAPLATIN) 700 mg in sodium chloride 0.9 % 250 mL chemo infusion, 700 mg (100 % of original dose 700 mg), Intravenous,  Once, 6 of 6 cycles. Dose modification: 700 mg (original dose 700 mg, Cycle 1). Administration: 700 mg (10/15/2018), 700 mg (11/05/2018), 700 mg (12/17/2018), 700 mg (01/07/2019), 700 mg (11/26/2018), 700 mg (09/24/2018)  DOCEtaxel (TAXOTERE) 130 mg in sodium chloride 0.9 % 250 mL chemo infusion, 75 mg/m2 = 130 mg, Intravenous,  Once, 6 of 6 cycles. Administration: 130 mg (10/15/2018), 130 mg (11/05/2018), 130 mg (12/17/2018), 130 mg (01/07/2019), 130 mg (11/26/2018), 130 mg (09/24/2018)  pertuzumab (PERJETA) 420 mg in sodium chloride 0.9 % 250 mL chemo infusion, 420 mg (50 % of original dose 840 mg), Intravenous, Once, 12 of 17 cycles. Dose modification: 420 mg (original dose 840 mg, Cycle 1, Reason: Provider Judgment). Administration: 420 mg (10/15/2018), 420 mg (11/05/2018), 420 mg (12/17/2018), 420 mg (01/07/2019), 420 mg (01/28/2019), 420 mg (03/11/2019), 420 mg (04/01/2019), 420 mg (11/26/2018), 420 mg (02/18/2019), 420 mg (  04/24/2019), 420 mg (05/22/2019), 420 mg (09/24/2018)  fosaprepitant (EMEND) 150 mg   dexamethasone (DECADRON) 12 mg in sodium chloride 0.9 % 145 mL IVPB, , Intravenous,  Once, 6 of 6 cycles. Administration:   (10/15/2018),  (11/05/2018),  (12/17/2018),  (01/07/2019),  (11/26/2018),  (09/24/2018)  trastuzumab-dkst (OGIVRI) 378 mg in sodium chloride 0.9 % 250 mL chemo infusion, 6 mg/kg = 378 mg (100 % of original dose 6 mg/kg), Intravenous,  Once, 3 of 3 cycles. Dose modification: 6 mg/kg (original dose 6 mg/kg, Cycle 4, Reason: Other (see comments), Comment: Biosimilar Conversion). Administration: 378 mg (11/26/2018), 378 mg (12/17/2018), 378 mg (01/07/2019)     - 03/23/2019 Radiation Therapy   Completed at [facility]   03/2019 - 03/2024 Anti-estrogen oral therapy   Tamoxifen     CHIEF COMPLIANT: Follow-up of right breast canceron tamoxifen, neratinib  INTERVAL HISTORY: Megan Acevedo is a 49 y.o. with above-mentioned history of HER-2 positive right breast cancer who underwent alumpectomy,adjuvant chemotherapy,radiation,Herceptin Perjeta maintenance,and is currently on antiestrogen therapy with tamoxifen and neratinib.She presents to the clinic todayfor a toxicity check.   ALLERGIES:  has No Known Allergies.  MEDICATIONS:  Current Outpatient Medications  Medication Sig Dispense Refill  . acetaminophen (TYLENOL) 325 MG tablet Take 650 mg by mouth every 6 (six) hours as needed.    . benzoyl peroxide-erythromycin (BENZAMYCIN) gel Apply topically 2 (two) times daily. 23.3 g 0  . diphenoxylate-atropine (LOMOTIL) 2.5-0.025 MG tablet Take 1 tablet by mouth 4 (four) times daily as needed for diarrhea or loose stools. 30 tablet 0  . LATISSE 0.03 % ophthalmic solution Place 1 application into both eyes at bedtime. Place one drop on applicator and apply evenly along the skin of the upper eyelid at base of eyelashes once daily at bedtime; repeat procedure for second eye (use a clean applicator). 3 mL 6  . Multiple Vitamins-Minerals (CENTRUM SILVER 50+WOMEN) TABS Take 1 each by mouth every morning.    . Neratinib Maleate (NERLYNX) 40 MG tablet Take 6 tablets (240 mg total) by mouth daily. Take with food. 180  tablet 1  . omeprazole (PRILOSEC) 20 MG capsule Take 20 mg by mouth daily.    Marland Kitchen oxybutynin (DITROPAN) 5 MG tablet Take 5 mg by mouth 3 (three) times daily.    . tamoxifen (NOLVADEX) 10 MG tablet Take 1 tablet (10 mg total) by mouth 2 (two) times daily. 60 tablet 3   No current facility-administered medications for this visit.    PHYSICAL EXAMINATION: ECOG PERFORMANCE STATUS: 1 - Symptomatic but completely ambulatory  Vitals:   12/02/19 0841  BP: 131/70  Pulse: 77  Resp: 18  Temp: 98.1 F (36.7 C)  SpO2: 100%   Filed Weights   12/02/19 0841  Weight: 134 lb 11.2 oz (61.1 kg)    LABORATORY DATA:  I have reviewed the data as listed CMP Latest Ref Rng & Units 08/12/2019 07/03/2019 05/22/2019  Glucose 70 - 99 mg/dL 89 76 82  BUN 6 - 20 mg/dL $Remove'14 16 15  'djvnPCM$ Creatinine 0.44 - 1.00 mg/dL 0.70 0.79 0.71  Sodium 135 - 145 mmol/L 144 142 143  Potassium 3.5 - 5.1 mmol/L 4.1 3.8 4.0  Chloride 98 - 111 mmol/L 109 108 109  CO2 22 - 32 mmol/L $RemoveB'26 24 25  'mYrwgLTR$ Calcium 8.9 - 10.3 mg/dL 8.9 8.9 8.8(L)  Total Protein 6.5 - 8.1 g/dL 6.9 7.1 7.3  Total Bilirubin 0.3 - 1.2 mg/dL 0.4 0.6 0.5  Alkaline Phos 38 - 126 U/L 93 90 84  AST 15 - 41 U/L 15 12(L) 13(L)  ALT 0 - 44 U/L $Remo'16 13 13    'qpqtX$ Lab Results  Component Value Date   WBC 5.0 12/02/2019   HGB 13.5 12/02/2019   HCT 40.5 12/02/2019   MCV 94.4 12/02/2019   PLT 257 12/02/2019   NEUTROABS 3.1 12/02/2019    ASSESSMENT & PLAN:  Malignant neoplasm of upper-outer quadrant of right breast in female, estrogen receptor positive ([place]) 08/20/2018:Right lumpectomy: Grade 2 IDC with DCIS, 2.2 cm, margins negative, 1/1 lymph node positive with focal extranodal extension, ER 50%, PR 50%, HER-2 positive, 3+, Ki-67 10 to 15%, T2N1  Recommendation: 1.Adjuvant chemotherapy with TCH Perjeta completed 01/07/2019 followed by Herceptin Perjeta maintenance 2.Adjuvant radiation at [facility] completed 03/23/2019 3.Adjuvant antiestrogen therapy with tamoxifen x10  years 4.Adjuvant neratinib ---------------------------------------------------------------------------------------------------------------------------------------- Current treatment:adjuvant Tamoxifen 10 mg daily with Neratinib (started  Echocardiogram2/19/2021: EF 60 to 65% Arthralgias:Joint symptoms improved with lower dosage of tamoxifen.   Neratinib patient did not receive the neratinib.  I called our pharmacist who will work on it and try to obtain it as soon as possible.  RTC in 3 weeks with labs and follow up    No orders of the defined types were placed in this encounter.  The patient has a good understanding of the overall plan. she agrees with it. she will call with any problems that may develop before the next visit here.  Total time spent: 30 mins including face to face time and time spent for planning, charting and coordination of care  Nicholas Lose, MD 12/02/2019  I, Cloyde Reams Dorshimer, am acting as scribe for Dr. Nicholas Lose.  I have reviewed the above documentation for accuracy and completeness, and I agree with the above.

## 2019-12-02 ENCOUNTER — Other Ambulatory Visit: Payer: Self-pay

## 2019-12-02 ENCOUNTER — Inpatient Hospital Stay: Payer: 59

## 2019-12-02 ENCOUNTER — Telehealth: Payer: Self-pay

## 2019-12-02 ENCOUNTER — Telehealth: Payer: Self-pay | Admitting: Pharmacist

## 2019-12-02 ENCOUNTER — Encounter: Payer: Self-pay | Admitting: Orthopaedic Surgery

## 2019-12-02 ENCOUNTER — Ambulatory Visit (INDEPENDENT_AMBULATORY_CARE_PROVIDER_SITE_OTHER): Payer: 59 | Admitting: Orthopaedic Surgery

## 2019-12-02 ENCOUNTER — Inpatient Hospital Stay (HOSPITAL_BASED_OUTPATIENT_CLINIC_OR_DEPARTMENT_OTHER): Payer: 59 | Admitting: Hematology and Oncology

## 2019-12-02 DIAGNOSIS — C50411 Malignant neoplasm of upper-outer quadrant of right female breast: Secondary | ICD-10-CM

## 2019-12-02 DIAGNOSIS — Z923 Personal history of irradiation: Secondary | ICD-10-CM | POA: Diagnosis not present

## 2019-12-02 DIAGNOSIS — Z170 Estrogen receptor positive status [ER+]: Secondary | ICD-10-CM

## 2019-12-02 DIAGNOSIS — M65351 Trigger finger, right little finger: Secondary | ICD-10-CM

## 2019-12-02 DIAGNOSIS — Z79899 Other long term (current) drug therapy: Secondary | ICD-10-CM | POA: Diagnosis not present

## 2019-12-02 DIAGNOSIS — Z79810 Long term (current) use of selective estrogen receptor modulators (SERMs): Secondary | ICD-10-CM | POA: Diagnosis not present

## 2019-12-02 DIAGNOSIS — M65311 Trigger thumb, right thumb: Secondary | ICD-10-CM | POA: Diagnosis not present

## 2019-12-02 DIAGNOSIS — J302 Other seasonal allergic rhinitis: Secondary | ICD-10-CM

## 2019-12-02 DIAGNOSIS — M65331 Trigger finger, right middle finger: Secondary | ICD-10-CM

## 2019-12-02 DIAGNOSIS — Z9221 Personal history of antineoplastic chemotherapy: Secondary | ICD-10-CM | POA: Diagnosis not present

## 2019-12-02 LAB — CBC WITH DIFFERENTIAL (CANCER CENTER ONLY)
Abs Immature Granulocytes: 0.01 10*3/uL (ref 0.00–0.07)
Basophils Absolute: 0 10*3/uL (ref 0.0–0.1)
Basophils Relative: 1 %
Eosinophils Absolute: 0.1 10*3/uL (ref 0.0–0.5)
Eosinophils Relative: 2 %
HCT: 40.5 % (ref 36.0–46.0)
Hemoglobin: 13.5 g/dL (ref 12.0–15.0)
Immature Granulocytes: 0 %
Lymphocytes Relative: 28 %
Lymphs Abs: 1.4 10*3/uL (ref 0.7–4.0)
MCH: 31.5 pg (ref 26.0–34.0)
MCHC: 33.3 g/dL (ref 30.0–36.0)
MCV: 94.4 fL (ref 80.0–100.0)
Monocytes Absolute: 0.3 10*3/uL (ref 0.1–1.0)
Monocytes Relative: 7 %
Neutro Abs: 3.1 10*3/uL (ref 1.7–7.7)
Neutrophils Relative %: 62 %
Platelet Count: 257 10*3/uL (ref 150–400)
RBC: 4.29 MIL/uL (ref 3.87–5.11)
RDW: 11.9 % (ref 11.5–15.5)
WBC Count: 5 10*3/uL (ref 4.0–10.5)
nRBC: 0 % (ref 0.0–0.2)

## 2019-12-02 LAB — CMP (CANCER CENTER ONLY)
ALT: 16 U/L (ref 0–44)
AST: 15 U/L (ref 15–41)
Albumin: 4 g/dL (ref 3.5–5.0)
Alkaline Phosphatase: 73 U/L (ref 38–126)
Anion gap: 8 (ref 5–15)
BUN: 13 mg/dL (ref 6–20)
CO2: 26 mmol/L (ref 22–32)
Calcium: 9.7 mg/dL (ref 8.9–10.3)
Chloride: 109 mmol/L (ref 98–111)
Creatinine: 0.8 mg/dL (ref 0.44–1.00)
GFR, Est AFR Am: >60 mL/min (ref >60)
GFR, Estimated: >60 mL/min (ref >60)
Glucose, Bld: 76 mg/dL (ref 70–99)
Potassium: 4.4 mmol/L (ref 3.5–5.1)
Sodium: 143 mmol/L (ref 135–145)
Total Bilirubin: 0.6 mg/dL (ref 0.3–1.2)
Total Protein: 7.3 g/dL (ref 6.5–8.1)

## 2019-12-02 MED ORDER — BUPIVACAINE HCL 0.25 % IJ SOLN
0.3300 mL | INTRAMUSCULAR | Status: AC | PRN
  Administered 2019-12-02: .33 mL

## 2019-12-02 MED ORDER — METHYLPREDNISOLONE ACETATE 40 MG/ML IJ SUSP
13.3300 mg | INTRAMUSCULAR | Status: AC | PRN
  Administered 2019-12-02: 13.33 mg

## 2019-12-02 MED ORDER — LIDOCAINE HCL 1 % IJ SOLN
1.0000 mL | INTRAMUSCULAR | Status: AC | PRN
  Administered 2019-12-02: 1 mL

## 2019-12-02 NOTE — Assessment & Plan Note (Signed)
08/20/2018:Right lumpectomy: Grade 2 IDC with DCIS, 2.2 cm, margins negative, 1/1 lymph node positive with focal extranodal extension, ER 50%, PR 50%, HER-2 positive, 3+, Ki-67 10 to 15%, T2N1  Recommendation: 1.Adjuvant chemotherapy with TCH Perjeta completed 01/07/2019 followed by Herceptin Perjeta maintenance 2.Adjuvant radiation at [place] completed 03/23/2019 3.Adjuvant antiestrogen therapy with tamoxifen x10 years 4.Adjuvant neratinib ---------------------------------------------------------------------------------------------------------------------------------------- Current treatment:adjuvant Tamoxifen 10 mg daily with Neratinib (started  Echocardiogram2/19/2021: EF 60 to 65% Arthralgias:Joint symptoms improved with lower dosage of tamoxifen.   Neratinib Toxicities:   RTC in 1 month with labs and follow up 

## 2019-12-02 NOTE — Telephone Encounter (Addendum)
Oral Oncology Patient Advocate Encounter  Received notification from [place] that prior authorization for Nerlynx is required.  PA submitted on CoverMyMeds Key [id number] Status is pending  Oral Oncology Clinic will continue to follow.  [place] Patient [place] Phone [phone] Fax [phone] 12/02/2019 9:25 AM

## 2019-12-02 NOTE — Telephone Encounter (Signed)
Oral Oncology Pharmacist Encounter  Received new prescription for Nerlynx (neratinib) for the extended adjuvant treatment of ER/PR positive, HER-2 positive, breast cancer in conjunction with tamoxifen, planned duration until disease progression or unacceptable drug toxicity or up to 1 year.  Prescription dose and frequency assessed for appropriateness. OK for therapy initiation. Patient will be dose-escalated to improve tolerability of medication.  CBC w/ Diff and CMP from 12/02/19 assessed, no relevant lab abnormalities noted.  Current medication list in Epic reviewed, DDIs with neratinib identified:  Category X DDI between Neratinib and Omeprazole - proton pump inhibitors can decrease serum concentrations of neratinib thus decrease efficacy. Will discuss with patient need for discontinuation of omeprazole. Will discuss that patient can utilize H2RAs like famotidine for acid suppression spaced out from neratinib administration either 2 hours before or 10 hours after administration.   Evaluated chart and no patient barriers to medication adherence noted.   Prescription has been e-scribed to the [facility] for benefits analysis and approval.  Oral Oncology Clinic will continue to follow for insurance authorization, copayment issues, initial counseling and start date.  Leron Croak, PharmD, BCPS Hematology/Oncology Clinical Pharmacist [facility] [phone] 12/02/2019 9:20 AM

## 2019-12-02 NOTE — Progress Notes (Signed)
Office Visit Note   Patient: Megan Acevedo           Date of Birth: 1971/01/31           MRN: [id number] Visit Date: 12/02/2019              Requested by: [name], MD No address on file PCP: [name], MD   Assessment & Plan: Visit Diagnoses:  1. Trigger finger of right thumb   2. Trigger finger, right middle finger   3. Trigger little finger of right hand     Plan: Impression is trigger fingers to the right thumb, middle and small fingers.  We injected all 3 with cortisone today.  We have discussed definitive treatment to include A1 pulley release should she fail to have long-lasting relief of symptoms following the injections.  Follow-up with [place] as needed.  Follow-Up Instructions: Return if symptoms worsen or fail to improve.   Orders:  Orders Placed This Encounter  Procedures  . Hand/UE Inj: R thumb A1  . Hand/UE Inj: R long A1  . Hand/UE Inj: R small A1   No orders of the defined types were placed in this encounter.     Procedures: Hand/UE Inj: R thumb A1 for trigger finger on 12/02/2019 10:53 AM Indications: pain Details: 25 G needle Medications: 1 mL lidocaine 1 %; 0.33 mL bupivacaine 0.25 %; 13.33 mg methylPREDNISolone acetate 40 MG/ML  Hand/UE Inj: R long A1 for trigger finger on 12/02/2019 10:53 AM Indications: pain Details: 25 G needle Medications: 1 mL lidocaine 1 %; 0.33 mL bupivacaine 0.25 %; 13.33 mg methylPREDNISolone acetate 40 MG/ML  Hand/UE Inj: R small A1 for trigger finger on 12/02/2019 10:53 AM Indications: pain Details: 25 G needle Medications: 1 mL lidocaine 1 %; 0.33 mL bupivacaine 0.25 %; 13.33 mg methylPREDNISolone acetate 40 MG/ML      Clinical Data: No additional findings.   Subjective: Chief Complaint  Patient presents with  . Right Hand - Pain    HPI patient is a pleasant 49 year old female who comes in today with recurrent triggering to the right long finger and thumb as well as new triggering and pain  to the right small finger.  She was seen in our office on July 31, 2019 where the right thumb and long fingers were both injected with cortisone.  These helped quite a bit until she increased her dose of tamoxifen which seems to reactivate her trigger fingers.  She would like injections again today.  Review of Systems as detailed in HPI.  All others reviewed and are negative.   Objective: Vital Signs: There were no vitals taken for this visit.  Physical Exam well-developed well-nourished female in no acute distress.  Alert and oriented x3.  Ortho Exam examination of the right hand reveals moderate pain and a palpable nodule at the A1 pulleys of the thumb, long and small fingers.  She also has reproducible triggering to all of these fingers as well.  Specialty Comments:  No specialty comments available.  Imaging: No new imaging   PMFS History: Patient Active Problem List   Diagnosis Date Noted  . Port-A-Cath in place 10/15/2018  . Malignant neoplasm of upper-outer quadrant of right breast in female, estrogen receptor positive ([place]) 07/23/2018   Past Medical History:  Diagnosis Date  . Breast cancer ([place]) 2020   Right Breast CA  . Cancer ([place])   . Personal history of chemotherapy 2020   Right Breast CA  .  Personal history of radiation therapy 2020   Right Breast CA    History reviewed. No pertinent family history.  Past Surgical History:  Procedure Laterality Date  . ABDOMINAL HYSTERECTOMY    . BREAST LUMPECTOMY Right 08/20/2018  . BREAST LUMPECTOMY WITH RADIOACTIVE SEED AND SENTINEL LYMPH NODE BIOPSY Right 08/20/2018   Procedure: RIGHT BREAST LUMPECTOMY WITH RADIOACTIVE SEED AND RIGHT AXILLARY SENTINEL LYMPH NODE BIOPSY;  Surgeon: [name], MD;  Location: [facility];  Service: General;  Laterality: Right;  . CHOLECYSTECTOMY    . PORTACATH PLACEMENT Right 08/20/2018   Procedure: INSERTION PORT-A-CATH WITH ULTRASOUND;  Surgeon: [name], MD;   Location: [facility];  Service: General;  Laterality: Right;  . TUBAL LIGATION     Social History   Occupational History  . Not on file  Tobacco Use  . Smoking status: Never Smoker  . Smokeless tobacco: Never Used  Vaping Use  . Vaping Use: Never used  Substance and Sexual Activity  . Alcohol use: Never  . Drug use: Never  . Sexual activity: Not on file

## 2019-12-02 NOTE — Telephone Encounter (Addendum)
Oral Oncology Patient Advocate Encounter  Prior Authorization for Nerlynx has been approved.    PA# [id number] Effective dates: 12/02/19 through 11/30/20  Patients co-pay is $0  Oral Oncology Clinic will continue to follow.   [place] Patient Bells Phone [phone] Fax [phone] 12/02/2019 11:09 AM

## 2019-12-03 ENCOUNTER — Telehealth: Payer: Self-pay | Admitting: Hematology and Oncology

## 2019-12-03 MED ORDER — NERATINIB MALEATE 40 MG PO TABS: ORAL_TABLET | ORAL | 1 refills | Status: DC

## 2019-12-03 MED ORDER — LOPERAMIDE HCL 2 MG PO CAPS: ORAL_CAPSULE | ORAL | 3 refills | Status: DC

## 2019-12-03 MED FILL — LOPERAMIDE 2 MG CAPSULE: 2 | 30 days supply | Qty: 150 | Fill #0

## 2019-12-03 MED FILL — NERLYNX 40 MG TABLET: 40 | 28 days supply | Qty: 126 | Fill #0

## 2019-12-03 NOTE — Addendum Note (Signed)
Addended byBritt Boozer on: 12/03/2019 03:06 PM   Modules accepted: Orders

## 2019-12-03 NOTE — Telephone Encounter (Signed)
Scheduled per 8/25 los. Called and spoke with pt, confirmed 9/15 appts

## 2019-12-03 NOTE — Telephone Encounter (Signed)
Oral Chemotherapy Pharmacist Encounter  I spoke with patient for overview of: Nerlynx (neratinib) for the maintenance adjuvant treatment of Her-2 receptor positive, hormone receptor-positive breast cancer, after completion of Herceptin, planned duration one year.  Last Herceptin infusion: 09/02/19   Counseled patient on administration, dosing, side effects, monitoring, drug-food interactions, safe handling, storage, and disposal.  Prescribing information for Nerlynx recommends dose initiation at full dose, 6 tablets (240 mg) once daily, with the use of scheduled loperamide antidiarrheal prophylaxis for the first 8 weeks of therapy.  Nerlynx will be initiated on a dose titration schedule. Antidiarrheal prophylaxis with loperamide will be used. Patient will receive prescription of loperamide when neratinib is delivered from [facility]   Week 1: Patient will take Nerlynx 58m tablets, 3 tablets (1278m by mouth once daily with food Week 2: Patient will take Nerlynx 4088mablets, 4 tablets (160m9my mouth once daily with food Week 3: Patient will take Nerlynx 40mg31mlets, 5 tablets (200mg)77mmouth once daily with food Week 4: Patient will take Nerlynx 40mg t76mts, 6 tablets (240mg) b53muth once daily with food. 240 mg once daily is the target dose of Nerlynx.  Patient knows to avoid grapefruit and grapefruit juice while on treatment with Nerlynx.  Patient instructed to avoid use of PPIs or H2RAs while on treatment with Nerlynx, can separate antacids from Nerlynx by 3 hours if acid suppression is needed.  Nerlynx start date: 12/05/19  Adverse effects include but are not limited to: diarrhea, nausea, vomiting, mouth sores, fatigue, rash, and abdominal pain.    Patient instructed to increase or decrease loperamide dose to maintain 1-2 bowel movements per day.  Reviewed with patient importance of keeping a medication schedule and plan for any missed doses. No barriers to medication adherence  identified.  Medication reconciliation performed and medication/allergy list updated.  This will ship from the [facility] to deliver to patient's home on 12/04/19.  Patient informed the pharmacy will reach out 5-7 days prior to needing next fill of Nerlynx to coordinate continued medication acquisition to prevent break in therapy.  All questions answered.  Ms. Konicki voStoneyunderstanding and appreciation.   Patient knows to call the office with questions or concerns.  Isaias Dowson Leron Croak, BCPS Hematology/Oncology Clinical Pharmacist [facility]-[phone] 1:55 PM

## 2019-12-14 ENCOUNTER — Encounter: Payer: Self-pay | Admitting: Hematology and Oncology

## 2019-12-15 ENCOUNTER — Other Ambulatory Visit: Payer: Self-pay

## 2019-12-15 DIAGNOSIS — C50411 Malignant neoplasm of upper-outer quadrant of right female breast: Secondary | ICD-10-CM

## 2019-12-15 DIAGNOSIS — Z170 Estrogen receptor positive status [ER+]: Secondary | ICD-10-CM

## 2019-12-15 DIAGNOSIS — R197 Diarrhea, unspecified: Secondary | ICD-10-CM

## 2019-12-16 ENCOUNTER — Inpatient Hospital Stay: Payer: 59 | Attending: Hematology and Oncology | Admitting: Medical

## 2019-12-16 ENCOUNTER — Other Ambulatory Visit: Payer: Self-pay

## 2019-12-16 ENCOUNTER — Other Ambulatory Visit: Payer: Self-pay | Admitting: Medical

## 2019-12-16 ENCOUNTER — Inpatient Hospital Stay: Payer: 59 | Admitting: Medical

## 2019-12-16 VITALS — BP 127/81 | HR 89 | Temp 98.2°F | Resp 18 | Ht 65.0 in | Wt 130.9 lb

## 2019-12-16 DIAGNOSIS — Z923 Personal history of irradiation: Secondary | ICD-10-CM | POA: Insufficient documentation

## 2019-12-16 DIAGNOSIS — Z90710 Acquired absence of both cervix and uterus: Secondary | ICD-10-CM | POA: Insufficient documentation

## 2019-12-16 DIAGNOSIS — R197 Diarrhea, unspecified: Secondary | ICD-10-CM

## 2019-12-16 DIAGNOSIS — C50411 Malignant neoplasm of upper-outer quadrant of right female breast: Secondary | ICD-10-CM

## 2019-12-16 DIAGNOSIS — Z170 Estrogen receptor positive status [ER+]: Secondary | ICD-10-CM | POA: Diagnosis not present

## 2019-12-16 DIAGNOSIS — E876 Hypokalemia: Secondary | ICD-10-CM

## 2019-12-16 DIAGNOSIS — E860 Dehydration: Secondary | ICD-10-CM | POA: Diagnosis not present

## 2019-12-16 DIAGNOSIS — Z79899 Other long term (current) drug therapy: Secondary | ICD-10-CM | POA: Diagnosis not present

## 2019-12-16 DIAGNOSIS — Z9221 Personal history of antineoplastic chemotherapy: Secondary | ICD-10-CM | POA: Diagnosis not present

## 2019-12-16 DIAGNOSIS — Z79810 Long term (current) use of selective estrogen receptor modulators (SERMs): Secondary | ICD-10-CM | POA: Insufficient documentation

## 2019-12-16 LAB — CBC WITH DIFFERENTIAL (CANCER CENTER ONLY)
Abs Immature Granulocytes: 0.02 10*3/uL (ref 0.00–0.07)
Basophils Absolute: 0 10*3/uL (ref 0.0–0.1)
Basophils Relative: 0 %
Eosinophils Absolute: 0.1 10*3/uL (ref 0.0–0.5)
Eosinophils Relative: 2 %
HCT: 42 % (ref 36.0–46.0)
Hemoglobin: 14.5 g/dL (ref 12.0–15.0)
Immature Granulocytes: 0 %
Lymphocytes Relative: 28 %
Lymphs Abs: 1.6 10*3/uL (ref 0.7–4.0)
MCH: 31.5 pg (ref 26.0–34.0)
MCHC: 34.5 g/dL (ref 30.0–36.0)
MCV: 91.1 fL (ref 80.0–100.0)
Monocytes Absolute: 0.5 10*3/uL (ref 0.1–1.0)
Monocytes Relative: 8 %
Neutro Abs: 3.6 10*3/uL (ref 1.7–7.7)
Neutrophils Relative %: 62 %
Platelet Count: 260 10*3/uL (ref 150–400)
RBC: 4.61 MIL/uL (ref 3.87–5.11)
RDW: 11.7 % (ref 11.5–15.5)
WBC Count: 5.8 10*3/uL (ref 4.0–10.5)
nRBC: 0 % (ref 0.0–0.2)

## 2019-12-16 LAB — CMP (CANCER CENTER ONLY)
ALT: 43 U/L (ref 0–44)
AST: 21 U/L (ref 15–41)
Albumin: 3.8 g/dL (ref 3.5–5.0)
Alkaline Phosphatase: 73 U/L (ref 38–126)
Anion gap: 7 (ref 5–15)
BUN: 12 mg/dL (ref 6–20)
CO2: 29 mmol/L (ref 22–32)
Calcium: 9.2 mg/dL (ref 8.9–10.3)
Chloride: 103 mmol/L (ref 98–111)
Creatinine: 0.78 mg/dL (ref 0.44–1.00)
GFR, Est AFR Am: >60 mL/min (ref >60)
GFR, Estimated: >60 mL/min (ref >60)
Glucose, Bld: 89 mg/dL (ref 70–99)
Potassium: 3.3 mmol/L — ABNORMAL LOW (ref 3.5–5.1)
Sodium: 139 mmol/L (ref 135–145)
Total Bilirubin: 0.7 mg/dL (ref 0.3–1.2)
Total Protein: 7.1 g/dL (ref 6.5–8.1)

## 2019-12-16 LAB — MAGNESIUM: Magnesium: 1.8 mg/dL (ref 1.7–2.4)

## 2019-12-16 MED ORDER — SODIUM CHLORIDE 0.9 % IV SOLN
INTRAVENOUS | Status: AC
  Filled 2019-12-16: qty 250

## 2019-12-16 MED ORDER — POTASSIUM CHLORIDE CRYS ER 20 MEQ PO TBCR: 20.0000 meq | EXTENDED_RELEASE_TABLET | Freq: Every day | ORAL | 0 refills | Status: DC

## 2019-12-16 MED ORDER — DIPHENOXYLATE-ATROPINE 2.5-0.025 MG PO TABS: 1.0000 | ORAL_TABLET | Freq: Four times a day (QID) | ORAL | 2 refills | Status: DC | PRN

## 2019-12-16 NOTE — Patient Instructions (Signed)

## 2019-12-16 NOTE — Progress Notes (Signed)
Patient received 1L IVF per orders from Sandi Mealy, [place].   Refreshments provided.  Patient denied need for restroom.  Tolerated well.  VSS.  No s/s or c/o distress.

## 2019-12-18 NOTE — Progress Notes (Signed)
These results were reviewed with the patient.

## 2019-12-19 NOTE — Progress Notes (Signed)
Symptoms Management Clinic Progress Note   Megan Acevedo [id number] 07-10-1970 49 y.o.  Megan Acevedo is managed by Dr. Nicholas Lose  Actively treated with chemotherapy/immunotherapy/hormonal therapy: yes  Current therapy: neratinib  Next scheduled appointment with provider: 12/23/2019  Assessment: Plan:    Diarrhea, unspecified type - Plan: 0.9 %  sodium chloride infusion, diphenoxylate-atropine (LOMOTIL) 2.5-0.025 MG tablet  Dehydration - Plan: 0.9 %  sodium chloride infusion  Malignant neoplasm of upper-outer quadrant of right breast in female, estrogen receptor positive ([place])  Hypokalemia - Plan: potassium chloride SA (KLOR-CON) 20 MEQ tablet   Diarrhea: The patient was written for Lomotil.  She was told to hold neratinib until her return visit with Dr. Nicholas Lose on 12/23/2019.  Dehydration: Patient was given 1 L of normal saline IV today.  ER positive malignant neoplasm of the right breast: The patient was told to hold neratinib until her visit with Dr. Nicholas Lose on 12/23/2019.  Hypokalemia: The patient's potassium level returned at 3.3 today.  A prescription for potassium chloride 20 mEq once daily for 10 days was sent to her pharmacy.  Her labs will be rechecked on her return.  Please see After Visit Summary for patient specific instructions.  Future Appointments  Date Time Provider [place]  12/23/2019  8:15 AM [facility] None  12/23/2019  8:45 AM Nicholas Lose, MD [facility] None  02/22/2020  9:00 AM Nicholas Lose, MD [facility] None    No orders of the defined types were placed in this encounter.      Subjective:   Patient ID:  Megan Acevedo is a 49 y.o. (DOB 1970/05/23) female.  Chief Complaint: No chief complaint on file.   HPI Megan Acevedo  is a 49 y.o. female with a diagnosis of an ER positive malignant neoplasm of the right breast.  She has most recently been treated with neratinib.  This has been titrated  up to her last dose of 4-40 mg tablets daily for total dose of 160 mg daily.  She has not taken any of this today.  The target dose of neratinib is 240 mg daily.  She reports that she has had diarrhea since she took 3 tablets daily.  She is taken Imodium without benefit.  She did take Lomotil yesterday with her last episode of diarrhea being at 530 last evening.  She is scheduled to see Dr. Nicholas Lose next on 12/23/2019.    Medications: I have reviewed the patient's current medications.  Allergies: No Known Allergies  Past Medical History:  Diagnosis Date  . Breast cancer ([place]) 2020   Right Breast CA  . Cancer ([place])   . Personal history of chemotherapy 2020   Right Breast CA  . Personal history of radiation therapy 2020   Right Breast CA    Past Surgical History:  Procedure Laterality Date  . ABDOMINAL HYSTERECTOMY    . BREAST LUMPECTOMY Right 08/20/2018  . BREAST LUMPECTOMY WITH RADIOACTIVE SEED AND SENTINEL LYMPH NODE BIOPSY Right 08/20/2018   Procedure: RIGHT BREAST LUMPECTOMY WITH RADIOACTIVE SEED AND RIGHT AXILLARY SENTINEL LYMPH NODE BIOPSY;  Surgeon: [name], MD;  Location: [facility];  Service: General;  Laterality: Right;  . CHOLECYSTECTOMY    . PORTACATH PLACEMENT Right 08/20/2018   Procedure: INSERTION PORT-A-CATH WITH ULTRASOUND;  Surgeon: [name], MD;  Location: [facility];  Service: General;  Laterality: Right;  . TUBAL LIGATION      No family history on file.  Social History   Socioeconomic History  . Marital status: Married    Spouse name: Not on file  . Number of children: Not on file  . Years of education: Not on file  . Highest education level: Not on file  Occupational History  . Not on file  Tobacco Use  . Smoking status: Never Smoker  . Smokeless tobacco: Never Used  Vaping Use  . Vaping Use: Never used  Substance and Sexual Activity  . Alcohol use: Never  . Drug use: Never  . Sexual activity:  Not on file  Other Topics Concern  . Not on file  Social History Narrative  . Not on file   Social Determinants of Health   Financial Resource Strain:   . Difficulty of Paying Living Expenses: Not on file  Food Insecurity:   . Worried About Charity fundraiser in the Last Year: Not on file  . Ran Out of Food in the Last Year: Not on file  Transportation Needs:   . Lack of Transportation (Medical): Not on file  . Lack of Transportation (Non-Medical): Not on file  Physical Activity:   . Days of Exercise per Week: Not on file  . Minutes of Exercise per Session: Not on file  Stress:   . Feeling of Stress : Not on file  Social Connections:   . Frequency of Communication with Friends and Family: Not on file  . Frequency of Social Gatherings with Friends and Family: Not on file  . Attends Religious Services: Not on file  . Active Member of Clubs or Organizations: Not on file  . Attends Archivist Meetings: Not on file  . Marital Status: Not on file  Intimate Partner Violence:   . Fear of Current or Ex-Partner: Not on file  . Emotionally Abused: Not on file  . Physically Abused: Not on file  . Sexually Abused: Not on file    Past Medical History, Surgical history, Social history, and Family history were reviewed and updated as appropriate.   Please see review of systems for further details on the patient's review from today.   Review of Systems:  Review of Systems  Constitutional: Negative for appetite change, chills, diaphoresis and fever.  HENT: Negative for trouble swallowing and voice change.   Respiratory: Negative for cough, chest tightness, shortness of breath and wheezing.   Cardiovascular: Negative for chest pain, palpitations and leg swelling.  Gastrointestinal: Positive for diarrhea. Negative for abdominal distention, abdominal pain, blood in stool, constipation, nausea and vomiting.  Genitourinary: Negative for decreased urine volume and difficulty  urinating.  Musculoskeletal: Negative for back pain and myalgias.  Neurological: Negative for dizziness, weakness, light-headedness and headaches.    Objective:   Physical Exam:  BP 127/81 (BP Location: Right Arm, Patient Position: Sitting)   Pulse 89   Temp 98.2 F (36.8 C) (Tympanic)   Resp 18   Ht 5\' 5"  (1.651 m)   Wt 130 lb 14.4 oz (59.4 kg)   SpO2 100%   BMI 21.78 kg/m  ECOG: 0  Orthostatic Blood Pressure: Blood pressure:   sitting 138/95, standing 118/79 Pulse:   sitting 87, standing 100    Physical Exam Constitutional:      General: She is not in acute distress.    Appearance: She is not diaphoretic.  HENT:     Head: Normocephalic and atraumatic.  Cardiovascular:     Rate and Rhythm: Normal rate and regular rhythm.     Heart sounds: Normal  heart sounds. No murmur heard.  No friction rub. No gallop.   Pulmonary:     Effort: Pulmonary effort is normal. No respiratory distress.     Breath sounds: Normal breath sounds. No wheezing or rales.  Skin:    General: Skin is warm and dry.     Findings: No erythema or rash.  Neurological:     Mental Status: She is alert.  Psychiatric:        Mood and Affect: Mood normal.        Behavior: Behavior normal.        Thought Content: Thought content normal.        Judgment: Judgment normal.     Lab Review:     Component Value Date/Time   NA 139 12/16/2019 1337   K 3.3 (L) 12/16/2019 1337   CL 103 12/16/2019 1337   CO2 29 12/16/2019 1337   GLUCOSE 89 12/16/2019 1337   BUN 12 12/16/2019 1337   CREATININE 0.78 12/16/2019 1337   CALCIUM 9.2 12/16/2019 1337   PROT 7.1 12/16/2019 1337   ALBUMIN 3.8 12/16/2019 1337   AST 21 12/16/2019 1337   ALT 43 12/16/2019 1337   ALKPHOS 73 12/16/2019 1337   BILITOT 0.7 12/16/2019 1337   GFRNONAA >60 12/16/2019 1337   GFRAA >60 12/16/2019 1337       Component Value Date/Time   WBC 5.8 12/16/2019 1337   RBC 4.61 12/16/2019 1337   HGB 14.5 12/16/2019 1337   HCT 42.0  12/16/2019 1337   PLT 260 12/16/2019 1337   MCV 91.1 12/16/2019 1337   MCH 31.5 12/16/2019 1337   MCHC 34.5 12/16/2019 1337   RDW 11.7 12/16/2019 1337   LYMPHSABS 1.6 12/16/2019 1337   MONOABS 0.5 12/16/2019 1337   EOSABS 0.1 12/16/2019 1337   BASOSABS 0.0 12/16/2019 1337   -------------------------------  Imaging from last 24 hours (if applicable):  Radiology interpretation: No results found.      This case was discussed with Dr. Lindi Adie. He expresses agreement with my management of this patient.

## 2019-12-22 ENCOUNTER — Other Ambulatory Visit: Payer: Self-pay | Admitting: *Deleted

## 2019-12-22 DIAGNOSIS — C50411 Malignant neoplasm of upper-outer quadrant of right female breast: Secondary | ICD-10-CM

## 2019-12-22 DIAGNOSIS — Z170 Estrogen receptor positive status [ER+]: Secondary | ICD-10-CM

## 2019-12-22 NOTE — Progress Notes (Signed)
Patient Care Team: [name], MD as PCP - General (Internal Medicine) Mauro Kaufmann, RN as Oncology Nurse Navigator [name], RN as Oncology Nurse Navigator [name], MD as Consulting Physician (General Surgery) Nicholas Lose, MD as Consulting Physician (Hematology and Oncology)  DIAGNOSIS:    ICD-10-CM   1. Malignant neoplasm of upper-outer quadrant of right breast in female, estrogen receptor positive ([place])  C50.411    Z17.0     SUMMARY OF ONCOLOGIC HISTORY: Oncology History  Malignant neoplasm of upper-outer quadrant of right breast in female, estrogen receptor positive ([place])  06/26/2018 Initial Diagnosis   [place]: Screening mammogram detected focal asymmetry in the right breast, ultrasound revealed 2 cm oval mass: Biopsy revealed grade 1-2 invasive ductal carcinoma, ER 3+, PR 3+, HER-2 3+ positive T1c M0 stage Ia clinical stage   07/23/2018 Cancer Staging   Staging form: Breast, AJCC 8th Edition - Clinical stage from 07/23/2018: Stage IA (cT1c, cN0, cM0, G2, ER+, PR+, HER2+)   07/2018 Genetic Testing   Genetic testing in [place], [place], negative   08/20/2018 Surgery   Right lumpectomy Donne Hazel) [id number]): Grade 2 IDC with DCIS, 2.2 cm, margins negative, 1/1 lymph node positive with focal extranodal extension, ER 50%, PR 50%, HER-2 positive, 3+, Ki-67 10 to 15%, T2N1   09/03/2018 Cancer Staging   Staging form: Breast, AJCC 8th Edition - Pathologic: Stage IB (pT2, pN1a, cM0, G2, ER+, PR+, HER2+) - Signed by Gardenia Phlegm, NP on 09/03/2018   10/04/2018 - 05/22/2019 Chemotherapy   palonosetron (ALOXI) injection 0.25 mg, 0.25 mg, Intravenous,  Once, 6 of 6 cycles Administration: 0.25 mg (10/15/2018), 0.25 mg (11/05/2018), 0.25 mg (12/17/2018), 0.25 mg (01/07/2019), 0.25 mg (11/26/2018), 0.25 mg (09/24/2018)  pegfilgrastim-cbqv (UDENYCA) injection 6 mg, 6 mg, Subcutaneous, Once, 6 of 6 cycles. Administration: 6 mg (09/25/2018), 6 mg  (10/17/2018), 6 mg (11/06/2018), 6 mg (12/19/2018), 6 mg (01/08/2019), 6 mg (11/28/2018)  trastuzumab (HERCEPTIN) 504 mg in sodium chloride 0.9 % 250 mL chemo infusion, 8 mg/kg = 504 mg, Intravenous,  Once, 9 of 14 cycles. Dose modification: 6 mg/kg (original dose 6 mg/kg, Cycle 7, Reason: Other (see comments), Comment: Insurance will only approve Herceptin at this time.), 6 mg/kg (original dose 6 mg/kg, Cycle 12, Reason: Other (see comments), Comment: pt did not tolerate Kanjinti (itching)). Administration: 378 mg (10/15/2018), 378 mg (11/05/2018), 504 mg (09/24/2018), 378 mg (01/28/2019), 378 mg (02/18/2019), 378 mg (03/11/2019), 378 mg (04/01/2019), 378 mg (05/22/2019)  CARBOplatin (PARAPLATIN) 700 mg in sodium chloride 0.9 % 250 mL chemo infusion, 700 mg (100 % of original dose 700 mg), Intravenous,  Once, 6 of 6 cycles. Dose modification: 700 mg (original dose 700 mg, Cycle 1). Administration: 700 mg (10/15/2018), 700 mg (11/05/2018), 700 mg (12/17/2018), 700 mg (01/07/2019), 700 mg (11/26/2018), 700 mg (09/24/2018)  DOCEtaxel (TAXOTERE) 130 mg in sodium chloride 0.9 % 250 mL chemo infusion, 75 mg/m2 = 130 mg, Intravenous,  Once, 6 of 6 cycles. Administration: 130 mg (10/15/2018), 130 mg (11/05/2018), 130 mg (12/17/2018), 130 mg (01/07/2019), 130 mg (11/26/2018), 130 mg (09/24/2018)  pertuzumab (PERJETA) 420 mg in sodium chloride 0.9 % 250 mL chemo infusion, 420 mg (50 % of original dose 840 mg), Intravenous, Once, 12 of 17 cycles. Dose modification: 420 mg (original dose 840 mg, Cycle 1, Reason: Provider Judgment). Administration: 420 mg (10/15/2018), 420 mg (11/05/2018), 420 mg (12/17/2018), 420 mg (01/07/2019), 420 mg (01/28/2019), 420 mg (03/11/2019), 420 mg (04/01/2019), 420 mg (11/26/2018), 420 mg (02/18/2019), 420 mg (  04/24/2019), 420 mg (05/22/2019), 420 mg (09/24/2018)  fosaprepitant (EMEND) 150 mg   dexamethasone (DECADRON) 12 mg in sodium chloride 0.9 % 145 mL IVPB, , Intravenous,  Once, 6 of 6 cycles. Administration:   (10/15/2018),  (11/05/2018),  (12/17/2018),  (01/07/2019),  (11/26/2018),  (09/24/2018)  trastuzumab-dkst (OGIVRI) 378 mg in sodium chloride 0.9 % 250 mL chemo infusion, 6 mg/kg = 378 mg (100 % of original dose 6 mg/kg), Intravenous,  Once, 3 of 3 cycles. Dose modification: 6 mg/kg (original dose 6 mg/kg, Cycle 4, Reason: Other (see comments), Comment: Biosimilar Conversion). Administration: 378 mg (11/26/2018), 378 mg (12/17/2018), 378 mg (01/07/2019)     - 03/23/2019 Radiation Therapy   Completed at [facility]   03/2019 - 03/2024 Anti-estrogen oral therapy   Tamoxifen     CHIEF COMPLIANT: Follow-up of right breast canceron tamoxifen, neratinib  INTERVAL HISTORY: Megan Acevedo is a 49 y.o. with above-mentioned history of HER-2 positive right breast cancer who underwent alumpectomy,adjuvant chemotherapy,radiation,Herceptin Perjeta maintenance,andis currently onantiestrogen therapy with tamoxifen and neratinib.She presents to the clinic todayfor a toxicity check.    She had profound diarrhea when she took 4 tablets of neratinib.  She had mild diarrhea with 3 tablets.  But the diarrhea was uncontrollable and she had to come and be seen at the symptom management clinic.  She received IV fluids and Lomotil.  Imodium prevention did not work for her.  Lomotil finally stopped the diarrhea and she gained her weight back and she is feeling a lot better.  She had such profound fatigue when she was having diarrhea.   ALLERGIES:  has No Known Allergies.  MEDICATIONS:  Current Outpatient Medications  Medication Sig Dispense Refill  . acetaminophen (TYLENOL) 325 MG tablet Take 650 mg by mouth every 6 (six) hours as needed.    . benzoyl peroxide-erythromycin (BENZAMYCIN) gel Apply topically 2 (two) times daily. 23.3 g 0  . chlorpheniramine (CHLOR-TRIMETON) 4 MG tablet Take 4 mg by mouth 2 (two) times daily as needed for allergies.    . diphenoxylate-atropine (LOMOTIL) 2.5-0.025 MG tablet Take 1 tablet  by mouth 4 (four) times daily as needed for diarrhea or loose stools. 30 tablet 2  . LATISSE 0.03 % ophthalmic solution Place 1 application into both eyes at bedtime. Place one drop on applicator and apply evenly along the skin of the upper eyelid at base of eyelashes once daily at bedtime; repeat procedure for second eye (use a clean applicator). 3 mL 6  . loperamide (IMODIUM) 2 MG capsule Take 2 capsules (4 mg) by mouth three times daily on days 1-14, then 2 caps (4 mg) two times daily on days 15-56, then take as needed days 57-365 for diarrhea or loose stools 150 capsule 3  . Multiple Vitamins-Minerals (CENTRUM SILVER 50+WOMEN) TABS Take 1 each by mouth every morning.    . Neratinib Maleate (NERLYNX) 40 MG tablet Take 3 tablets (120 mg) by mouth daily for 7 days, then 4 tabs (129m) daily for 7 days, then 5 tabs (2044m daily for 7 days, then 6 tabs (240 mg) daily thereafter. Take with food. 180 tablet 1  . oxybutynin (DITROPAN) 5 MG tablet Take 5 mg by mouth 3 (three) times daily.    . potassium chloride SA (KLOR-CON) 20 MEQ tablet Take 1 tablet (20 mEq total) by mouth daily for 10 days. 10 tablet 0  . tamoxifen (NOLVADEX) 10 MG tablet Take 1 tablet (10 mg total) by mouth 2 (two) times daily. 60 tablet 3  No current facility-administered medications for this visit.    PHYSICAL EXAMINATION: ECOG PERFORMANCE STATUS: 1 - Symptomatic but completely ambulatory  Vitals:   12/23/19 0842  BP: 135/80  Pulse: 80  Resp: 16  Temp: 97.8 F (36.6 C)  SpO2: 100%   Filed Weights   12/23/19 0842  Weight: 133 lb 8 oz (60.6 kg)    LABORATORY DATA:  I have reviewed the data as listed CMP Latest Ref Rng & Units 12/16/2019 12/02/2019 08/12/2019  Glucose 70 - 99 mg/dL 89 76 89  BUN 6 - 20 mg/dL _0 Creatinine 0.44 - 1.00 mg/dL 0.78 0.80 0.70  Sodium 135 - 145 mmol/L 139 143 144  Potassium 3.5 - 5.1 mmol/L 3.3(L) 4.4 4.1  Chloride 98 - 111 mmol/L 103 109 109  CO2 22 - 32 mmol/L _1 Calcium  8.9 - 10.3 mg/dL 9.2 9.7 8.9  Total Protein 6.5 - 8.1 g/dL 7.1 7.3 6.9  Total Bilirubin 0.3 - 1.2 mg/dL 0.7 0.6 0.4  Alkaline Phos 38 - 126 U/L 73 73 93  AST 15 - 41 U/L _2 ALT 0 - 44 U/L 43 16 16    Lab Results  Component Value Date   WBC 5.6 12/23/2019   HGB 13.1 12/23/2019   HCT 39.8 12/23/2019   MCV 95.7 12/23/2019   PLT 274 12/23/2019   NEUTROABS 3.2 12/23/2019    ASSESSMENT & PLAN:  Malignant neoplasm of upper-outer quadrant of right breast in female, estrogen receptor positive ([place]) 08/20/2018:Right lumpectomy: Grade 2 IDC with DCIS, 2.2 cm, margins negative, 1/1 lymph node positive with focal extranodal extension, ER 50%, PR 50%, HER-2 positive, 3+, Ki-67 10 to 15%, T2N1  Recommendation: 1.Adjuvant chemotherapy with TCH Perjeta completed 01/07/2019 followed by Herceptin Perjeta maintenance 2.Adjuvant radiation at [facility] completed 03/23/2019 3.Adjuvant antiestrogen therapy with tamoxifen x10 years 4.Adjuvant neratinib ---------------------------------------------------------------------------------------------------------------------------------------- Current treatment:adjuvant Tamoxifen10 mg daily with Neratinib (started  Echocardiogram2/19/2021: EF 60 to 65% Arthralgias:Joint symptoms improved with lower dosage of tamoxifen.  Neratinib  toxicities: 1.  Severe diarrhea: We held neratinib.  This is in spite of starting her at a lower dosage. She has Imodium and Lomotil. Lomotil finally stopped the diarrhea. I recommend restarting neratinib at 2 tablets daily.  She will take this for the next 6 weeks and will come back to see [place] to decide if we can go up on the dosage.  RTC in 6 weeks with labs and follow up    No orders of the defined types were placed in this encounter.  The patient has a good understanding of the overall plan. she agrees with it. she will call with any problems that may develop before the next visit here.  Total time  spent: 30 mins including face to face time and time spent for planning, charting and coordination of care  Nicholas Lose, MD 12/23/2019  I, Cloyde Reams Dorshimer, am acting as scribe for Dr. Nicholas Lose.  I have reviewed the above documentation for accuracy and completeness, and I agree with the above.

## 2019-12-23 ENCOUNTER — Inpatient Hospital Stay: Payer: 59

## 2019-12-23 ENCOUNTER — Other Ambulatory Visit: Payer: Self-pay

## 2019-12-23 ENCOUNTER — Inpatient Hospital Stay (HOSPITAL_BASED_OUTPATIENT_CLINIC_OR_DEPARTMENT_OTHER): Payer: 59 | Admitting: Hematology and Oncology

## 2019-12-23 DIAGNOSIS — C50411 Malignant neoplasm of upper-outer quadrant of right female breast: Secondary | ICD-10-CM

## 2019-12-23 DIAGNOSIS — Z923 Personal history of irradiation: Secondary | ICD-10-CM | POA: Diagnosis not present

## 2019-12-23 DIAGNOSIS — E876 Hypokalemia: Secondary | ICD-10-CM | POA: Diagnosis not present

## 2019-12-23 DIAGNOSIS — Z170 Estrogen receptor positive status [ER+]: Secondary | ICD-10-CM

## 2019-12-23 DIAGNOSIS — Z79810 Long term (current) use of selective estrogen receptor modulators (SERMs): Secondary | ICD-10-CM | POA: Diagnosis not present

## 2019-12-23 DIAGNOSIS — Z9221 Personal history of antineoplastic chemotherapy: Secondary | ICD-10-CM | POA: Diagnosis not present

## 2019-12-23 DIAGNOSIS — E860 Dehydration: Secondary | ICD-10-CM | POA: Diagnosis not present

## 2019-12-23 DIAGNOSIS — R197 Diarrhea, unspecified: Secondary | ICD-10-CM | POA: Diagnosis not present

## 2019-12-23 DIAGNOSIS — Z79899 Other long term (current) drug therapy: Secondary | ICD-10-CM | POA: Diagnosis not present

## 2019-12-23 LAB — CBC WITH DIFFERENTIAL (CANCER CENTER ONLY)
Abs Immature Granulocytes: 0.03 10*3/uL (ref 0.00–0.07)
Basophils Absolute: 0.1 10*3/uL (ref 0.0–0.1)
Basophils Relative: 1 %
Eosinophils Absolute: 0.2 10*3/uL (ref 0.0–0.5)
Eosinophils Relative: 3 %
HCT: 39.8 % (ref 36.0–46.0)
Hemoglobin: 13.1 g/dL (ref 12.0–15.0)
Immature Granulocytes: 1 %
Lymphocytes Relative: 33 %
Lymphs Abs: 1.8 10*3/uL (ref 0.7–4.0)
MCH: 31.5 pg (ref 26.0–34.0)
MCHC: 32.9 g/dL (ref 30.0–36.0)
MCV: 95.7 fL (ref 80.0–100.0)
Monocytes Absolute: 0.3 10*3/uL (ref 0.1–1.0)
Monocytes Relative: 5 %
Neutro Abs: 3.2 10*3/uL (ref 1.7–7.7)
Neutrophils Relative %: 57 %
Platelet Count: 274 10*3/uL (ref 150–400)
RBC: 4.16 MIL/uL (ref 3.87–5.11)
RDW: 11.9 % (ref 11.5–15.5)
WBC Count: 5.6 10*3/uL (ref 4.0–10.5)
nRBC: 0 % (ref 0.0–0.2)

## 2019-12-23 LAB — CMP (CANCER CENTER ONLY)
ALT: 22 U/L (ref 0–44)
AST: 13 U/L — ABNORMAL LOW (ref 15–41)
Albumin: 3.8 g/dL (ref 3.5–5.0)
Alkaline Phosphatase: 73 U/L (ref 38–126)
Anion gap: 7 (ref 5–15)
BUN: 13 mg/dL (ref 6–20)
CO2: 26 mmol/L (ref 22–32)
Calcium: 9.4 mg/dL (ref 8.9–10.3)
Chloride: 107 mmol/L (ref 98–111)
Creatinine: 0.78 mg/dL (ref 0.44–1.00)
GFR, Est AFR Am: >60 mL/min (ref >60)
GFR, Estimated: >60 mL/min (ref >60)
Glucose, Bld: 77 mg/dL (ref 70–99)
Potassium: 4.1 mmol/L (ref 3.5–5.1)
Sodium: 140 mmol/L (ref 135–145)
Total Bilirubin: 0.4 mg/dL (ref 0.3–1.2)
Total Protein: 7.1 g/dL (ref 6.5–8.1)

## 2019-12-23 MED ORDER — NERATINIB MALEATE 40 MG PO TABS: ORAL_TABLET | ORAL | 1 refills | Status: DC

## 2019-12-23 NOTE — Assessment & Plan Note (Signed)
08/20/2018:Right lumpectomy: Grade 2 IDC with DCIS, 2.2 cm, margins negative, 1/1 lymph node positive with focal extranodal extension, ER 50%, PR 50%, HER-2 positive, 3+, Ki-67 10 to 15%, T2N1  Recommendation: 1.Adjuvant chemotherapy with TCH Perjeta completed 01/07/2019 followed by Herceptin Perjeta maintenance 2.Adjuvant radiation at [facility] completed 03/23/2019 3.Adjuvant antiestrogen therapy with tamoxifen x10 years 4.Adjuvant neratinib ---------------------------------------------------------------------------------------------------------------------------------------- Current treatment:adjuvant Tamoxifen10 mg daily with Neratinib (started  Echocardiogram2/19/2021: EF 60 to 65% Arthralgias:Joint symptoms improved with lower dosage of tamoxifen.  Neratinib  toxicities: 1.  Severe diarrhea: We held neratinib.  This is in spite of starting her at a lower dosage. She has Imodium and Lomotil. I recommend restarting neratinib at 3 tablets daily.  RTC in 3 weeks with labs and follow up

## 2019-12-24 ENCOUNTER — Telehealth: Payer: Self-pay | Admitting: Hematology and Oncology

## 2019-12-24 NOTE — Telephone Encounter (Signed)
Scheduled per 9/15 los. Called and left a msg, mailing appt letter and calendar printout

## 2020-01-05 MED FILL — NERLYNX 40 MG TABLET: 40 | 28 days supply | Qty: 126 | Fill #1

## 2020-01-20 ENCOUNTER — Telehealth: Payer: Self-pay | Admitting: Pharmacist

## 2020-01-20 NOTE — Telephone Encounter (Signed)
Oral Chemotherapy Pharmacist Encounter   Spoke with patient today to follow up regarding patient's oral chemotherapy medication: Nerlynx (neratinib)  Original Start date of oral chemotherapy: 12/05/19  Megan Acevedo reports that she currently has 126 tablets of neratinib on hand. She had significant issues with diarrhea when starting the neratinib and is currently taking Neratinib 40 mg tablets, 2 tablets (80 mg) by mouth once daily. Depending on tolerance, patient stated she may be increasing to 3 tablets once daily after discussion with MD at next office visit on 02/01/20.   Patient has recently had a change in her insurance and we discussed that her insurance is requiring that she fill through [employer]. Pharmacy has been added to patient's preferred pharmacy list.   Patient knows to call the office with questions or concerns.  Leron Croak, PharmD, BCPS Hematology/Oncology Clinical Pharmacist [facility] [phone] 01/20/2020 11:09 AM

## 2020-01-27 ENCOUNTER — Telehealth: Payer: Self-pay | Admitting: Hematology and Oncology

## 2020-01-27 NOTE — Telephone Encounter (Signed)
Release: [id number] Faxed last ov note to [place] @ fax# [phone]

## 2020-01-29 ENCOUNTER — Other Ambulatory Visit: Payer: Self-pay | Admitting: *Deleted

## 2020-01-29 DIAGNOSIS — Z170 Estrogen receptor positive status [ER+]: Secondary | ICD-10-CM

## 2020-01-29 DIAGNOSIS — C50411 Malignant neoplasm of upper-outer quadrant of right female breast: Secondary | ICD-10-CM

## 2020-02-01 ENCOUNTER — Telehealth: Payer: Self-pay | Admitting: Hematology and Oncology

## 2020-02-01 ENCOUNTER — Inpatient Hospital Stay: Payer: BC Managed Care – PPO

## 2020-02-01 ENCOUNTER — Inpatient Hospital Stay: Payer: BC Managed Care – PPO | Admitting: Hematology and Oncology

## 2020-02-01 NOTE — Telephone Encounter (Signed)
Patient is tolerating 2 tablets of neratinib extremely well. She has not had any diarrhea and has not taken any antidiarrheal medications.  Recommendation: Increase to 3 tablets a day Lab and follow-up 02/22/2020.

## 2020-02-01 NOTE — Assessment & Plan Note (Deleted)
08/20/2018:Right lumpectomy: Grade 2 IDC with DCIS, 2.2 cm, margins negative, 1/1 lymph node positive with focal extranodal extension, ER 50%, PR 50%, HER-2 positive, 3+, Ki-67 10 to 15%, T2N1  Recommendation: 1.Adjuvant chemotherapy with TCH Perjeta completed 01/07/2019 followed by Herceptin Perjeta maintenance 2.Adjuvant radiation at [facility] completed 03/23/2019 3.Adjuvant antiestrogen therapy with tamoxifen x10 years 4.Adjuvant neratinib ---------------------------------------------------------------------------------------------------------------------------------------- Current treatment:adjuvant Tamoxifen10 mg dailywith Neratinib (started Echocardiogram2/19/2021: EF 60 to 65% Arthralgias:Joint symptoms improved with lower dosage of tamoxifen.  Neratinib toxicities: Severe diarrhea: We held neratinib.  This is in spite of starting her at a lower dosage. She has Imodium and Lomotil. Lomotil finally stopped the diarrhea. Restarted neratinib at 2 tablets daily.

## 2020-02-21 NOTE — Progress Notes (Signed)
Patient Care Team: [name], MD as PCP - General (Internal Medicine) Mauro Kaufmann, RN as Oncology Nurse Navigator [name], RN as Oncology Nurse Navigator [name], MD as Consulting Physician (General Surgery) Nicholas Lose, MD as Consulting Physician (Hematology and Oncology)  DIAGNOSIS:    ICD-10-CM   1. Malignant neoplasm of upper-outer quadrant of right breast in female, estrogen receptor positive ([place])  C50.411    Z17.0     SUMMARY OF ONCOLOGIC HISTORY: Oncology History  Malignant neoplasm of upper-outer quadrant of right breast in female, estrogen receptor positive ([place])  06/26/2018 Initial Diagnosis   [place]: Screening mammogram detected focal asymmetry in the right breast, ultrasound revealed 2 cm oval mass: Biopsy revealed grade 1-2 invasive ductal carcinoma, ER 3+, PR 3+, HER-2 3+ positive T1c M0 stage Ia clinical stage   07/23/2018 Cancer Staging   Staging form: Breast, AJCC 8th Edition - Clinical stage from 07/23/2018: Stage IA (cT1c, cN0, cM0, G2, ER+, PR+, HER2+)   07/2018 Genetic Testing   Genetic testing in [place], [place], negative   08/20/2018 Surgery   Right lumpectomy Donne Hazel) [id number]): Grade 2 IDC with DCIS, 2.2 cm, margins negative, 1/1 lymph node positive with focal extranodal extension, ER 50%, PR 50%, HER-2 positive, 3+, Ki-67 10 to 15%, T2N1   09/03/2018 Cancer Staging   Staging form: Breast, AJCC 8th Edition - Pathologic: Stage IB (pT2, pN1a, cM0, G2, ER+, PR+, HER2+) - Signed by Gardenia Phlegm, NP on 09/03/2018   10/04/2018 - 05/22/2019 Chemotherapy   palonosetron (ALOXI) injection 0.25 mg, 0.25 mg, Intravenous,  Once, 6 of 6 cycles Administration: 0.25 mg (10/15/2018), 0.25 mg (11/05/2018), 0.25 mg (12/17/2018), 0.25 mg (01/07/2019), 0.25 mg (11/26/2018), 0.25 mg (09/24/2018)  pegfilgrastim-cbqv (UDENYCA) injection 6 mg, 6 mg, Subcutaneous, Once, 6 of 6 cycles. Administration: 6 mg (09/25/2018), 6 mg  (10/17/2018), 6 mg (11/06/2018), 6 mg (12/19/2018), 6 mg (01/08/2019), 6 mg (11/28/2018)  trastuzumab (HERCEPTIN) 504 mg in sodium chloride 0.9 % 250 mL chemo infusion, 8 mg/kg = 504 mg, Intravenous,  Once, 9 of 14 cycles. Dose modification: 6 mg/kg (original dose 6 mg/kg, Cycle 7, Reason: Other (see comments), Comment: Insurance will only approve Herceptin at this time.), 6 mg/kg (original dose 6 mg/kg, Cycle 12, Reason: Other (see comments), Comment: pt did not tolerate Kanjinti (itching)). Administration: 378 mg (10/15/2018), 378 mg (11/05/2018), 504 mg (09/24/2018), 378 mg (01/28/2019), 378 mg (02/18/2019), 378 mg (03/11/2019), 378 mg (04/01/2019), 378 mg (05/22/2019)  CARBOplatin (PARAPLATIN) 700 mg in sodium chloride 0.9 % 250 mL chemo infusion, 700 mg (100 % of original dose 700 mg), Intravenous,  Once, 6 of 6 cycles. Dose modification: 700 mg (original dose 700 mg, Cycle 1). Administration: 700 mg (10/15/2018), 700 mg (11/05/2018), 700 mg (12/17/2018), 700 mg (01/07/2019), 700 mg (11/26/2018), 700 mg (09/24/2018)  DOCEtaxel (TAXOTERE) 130 mg in sodium chloride 0.9 % 250 mL chemo infusion, 75 mg/m2 = 130 mg, Intravenous,  Once, 6 of 6 cycles. Administration: 130 mg (10/15/2018), 130 mg (11/05/2018), 130 mg (12/17/2018), 130 mg (01/07/2019), 130 mg (11/26/2018), 130 mg (09/24/2018)  pertuzumab (PERJETA) 420 mg in sodium chloride 0.9 % 250 mL chemo infusion, 420 mg (50 % of original dose 840 mg), Intravenous, Once, 12 of 17 cycles. Dose modification: 420 mg (original dose 840 mg, Cycle 1, Reason: Provider Judgment). Administration: 420 mg (10/15/2018), 420 mg (11/05/2018), 420 mg (12/17/2018), 420 mg (01/07/2019), 420 mg (01/28/2019), 420 mg (03/11/2019), 420 mg (04/01/2019), 420 mg (11/26/2018), 420 mg (02/18/2019), 420 mg (  04/24/2019), 420 mg (05/22/2019), 420 mg (09/24/2018)  fosaprepitant (EMEND) 150 mg   dexamethasone (DECADRON) 12 mg in sodium chloride 0.9 % 145 mL IVPB, , Intravenous,  Once, 6 of 6 cycles. Administration:   (10/15/2018),  (11/05/2018),  (12/17/2018),  (01/07/2019),  (11/26/2018),  (09/24/2018)  trastuzumab-dkst (OGIVRI) 378 mg in sodium chloride 0.9 % 250 mL chemo infusion, 6 mg/kg = 378 mg (100 % of original dose 6 mg/kg), Intravenous,  Once, 3 of 3 cycles. Dose modification: 6 mg/kg (original dose 6 mg/kg, Cycle 4, Reason: Other (see comments), Comment: Biosimilar Conversion). Administration: 378 mg (11/26/2018), 378 mg (12/17/2018), 378 mg (01/07/2019)     - 03/23/2019 Radiation Therapy   Completed at [facility]   03/2019 - 03/2024 Anti-estrogen oral therapy   Tamoxifen     CHIEF COMPLIANT: Follow-up of right breast canceron tamoxifen, neratinib  INTERVAL HISTORY: JAHLEAH MARISCAL is a 49 y.o. with above-mentioned history of HER-2 positive right breast cancer who underwent alumpectomy,adjuvant chemotherapy,radiation,Herceptin Perjeta maintenance,andis currently onantiestrogen therapy with tamoxifenand neratinib. She presents to the clinic todayfora toxicity check.    She tells me that she is currently on 4 tablets of neratinib daily and diarrhea is quite manageable.  She truly has not required any antidiarrheal medications.  She thinks she wants to go up to 5 tablets a daily in a few weeks time.  ALLERGIES:  has No Known Allergies.  MEDICATIONS:  Current Outpatient Medications  Medication Sig Dispense Refill  . acetaminophen (TYLENOL) 325 MG tablet Take 650 mg by mouth every 6 (six) hours as needed.    . benzoyl peroxide-erythromycin (BENZAMYCIN) gel Apply topically 2 (two) times daily. 23.3 g 0  . chlorpheniramine (CHLOR-TRIMETON) 4 MG tablet Take 4 mg by mouth 2 (two) times daily as needed for allergies.    . diphenoxylate-atropine (LOMOTIL) 2.5-0.025 MG tablet Take 1 tablet by mouth 4 (four) times daily as needed for diarrhea or loose stools. 30 tablet 2  . LATISSE 0.03 % ophthalmic solution Place 1 application into both eyes at bedtime. Place one drop on applicator and apply evenly  along the skin of the upper eyelid at base of eyelashes once daily at bedtime; repeat procedure for second eye (use a clean applicator). 3 mL 6  . loperamide (IMODIUM) 2 MG capsule Take 2 capsules (4 mg) by mouth three times daily on days 1-14, then 2 caps (4 mg) two times daily on days 15-56, then take as needed days 57-365 for diarrhea or loose stools 150 capsule 3  . Multiple Vitamins-Minerals (CENTRUM SILVER 50+WOMEN) TABS Take 1 each by mouth every morning.    . Neratinib Maleate (NERLYNX) 40 MG tablet Take 2 tabs daily. Take with food. 180 tablet 1  . oxybutynin (DITROPAN) 5 MG tablet Take 5 mg by mouth 3 (three) times daily.    . potassium chloride SA (KLOR-CON) 20 MEQ tablet Take 1 tablet (20 mEq total) by mouth daily for 10 days. 10 tablet 0  . tamoxifen (NOLVADEX) 10 MG tablet Take 1 tablet (10 mg total) by mouth 2 (two) times daily. 60 tablet 3   No current facility-administered medications for this visit.    PHYSICAL EXAMINATION: ECOG PERFORMANCE STATUS: 1 - Symptomatic but completely ambulatory  Vitals:   02/22/20 0912  BP: 133/88  Pulse: 81  Resp: 17  Temp: 97.9 F (36.6 C)  SpO2: 100%   Filed Weights   02/22/20 0912  Weight: 137 lb 12.8 oz (62.5 kg)    LABORATORY DATA:  I have reviewed  the data as listed CMP Latest Ref Rng & Units 12/23/2019 12/16/2019 12/02/2019  Glucose 70 - 99 mg/dL 77 89 76  BUN 6 - 20 mg/dL $Remove'13 12 13  'FbaUoMM$ Creatinine 0.44 - 1.00 mg/dL 0.78 0.78 0.80  Sodium 135 - 145 mmol/L 140 139 143  Potassium 3.5 - 5.1 mmol/L 4.1 3.3(L) 4.4  Chloride 98 - 111 mmol/L 107 103 109  CO2 22 - 32 mmol/L $RemoveB'26 29 26  'meOCTMhX$ Calcium 8.9 - 10.3 mg/dL 9.4 9.2 9.7  Total Protein 6.5 - 8.1 g/dL 7.1 7.1 7.3  Total Bilirubin 0.3 - 1.2 mg/dL 0.4 0.7 0.6  Alkaline Phos 38 - 126 U/L 73 73 73  AST 15 - 41 U/L 13(L) 21 15  ALT 0 - 44 U/L 22 43 16    Lab Results  Component Value Date   WBC 4.8 02/22/2020   HGB 13.2 02/22/2020   HCT 38.8 02/22/2020   MCV 92.8 02/22/2020   PLT 257  02/22/2020   NEUTROABS 2.9 02/22/2020    ASSESSMENT & PLAN:  Malignant neoplasm of upper-outer quadrant of right breast in female, estrogen receptor positive ([place]) 08/20/2018:Right lumpectomy: Grade 2 IDC with DCIS, 2.2 cm, margins negative, 1/1 lymph node positive with focal extranodal extension, ER 50%, PR 50%, HER-2 positive, 3+, Ki-67 10 to 15%, T2N1  Recommendation: 1.Adjuvant chemotherapy with TCH Perjeta completed 01/07/2019 followed by Herceptin Perjeta maintenance 2.Adjuvant radiation at [facility]  completed 03/23/2019 3.Adjuvant antiestrogen therapy with tamoxifen x10 years 4.Adjuvant neratinib ---------------------------------------------------------------------------------------------------------------------------------------- Current treatment:adjuvant Tamoxifen10 mg dailywith Neratinib (startedAugust 2021) Echocardiogram2/19/2021: EF 60 to 65% Arthralgias:Joint symptoms improved with lower dosage of tamoxifen.  Neratinib toxicities: 1.  Severe diarrhea: We held neratinib.  This is in spite of starting her at a lower dosage.  Lomotil finally stopped the diarrhea. We restarted neratinib at 2 tablets daily and increase to 4 tablets daily. She is doing significantly better with the diarrhea.  She has not required much of antidiarrheal medicines at 4 tablets daily.  Even though originally she had profound diarrhea at 4 tablets before.  She wants to consider going up to 5 tablets in a few weeks.  RTC in 3 months with labs and follow-up    No orders of the defined types were placed in this encounter.  The patient has a good understanding of the overall plan. she agrees with it. she will call with any problems that may develop before the next visit here.  Total time spent: 30 mins including face to face time and time spent for planning, charting and coordination of care  Nicholas Lose, MD 02/22/2020  I, Cloyde Reams Dorshimer, am acting as scribe for Dr. Nicholas Lose.  I have reviewed the above documentation for accuracy and completeness, and I agree with the above.

## 2020-02-22 ENCOUNTER — Inpatient Hospital Stay: Payer: BC Managed Care – PPO | Attending: Hematology and Oncology | Admitting: Hematology and Oncology

## 2020-02-22 ENCOUNTER — Telehealth: Payer: Self-pay | Admitting: Hematology and Oncology

## 2020-02-22 ENCOUNTER — Other Ambulatory Visit: Payer: Self-pay

## 2020-02-22 ENCOUNTER — Inpatient Hospital Stay: Payer: BC Managed Care – PPO

## 2020-02-22 DIAGNOSIS — C50411 Malignant neoplasm of upper-outer quadrant of right female breast: Secondary | ICD-10-CM

## 2020-02-22 DIAGNOSIS — Z170 Estrogen receptor positive status [ER+]: Secondary | ICD-10-CM

## 2020-02-22 DIAGNOSIS — Z79810 Long term (current) use of selective estrogen receptor modulators (SERMs): Secondary | ICD-10-CM | POA: Diagnosis not present

## 2020-02-22 DIAGNOSIS — Z9221 Personal history of antineoplastic chemotherapy: Secondary | ICD-10-CM | POA: Diagnosis not present

## 2020-02-22 DIAGNOSIS — Z923 Personal history of irradiation: Secondary | ICD-10-CM | POA: Diagnosis not present

## 2020-02-22 DIAGNOSIS — Z79899 Other long term (current) drug therapy: Secondary | ICD-10-CM | POA: Insufficient documentation

## 2020-02-22 LAB — CBC WITH DIFFERENTIAL (CANCER CENTER ONLY)
Abs Immature Granulocytes: 0.01 10*3/uL (ref 0.00–0.07)
Basophils Absolute: 0 10*3/uL (ref 0.0–0.1)
Basophils Relative: 1 %
Eosinophils Absolute: 0.1 10*3/uL (ref 0.0–0.5)
Eosinophils Relative: 2 %
HCT: 38.8 % (ref 36.0–46.0)
Hemoglobin: 13.2 g/dL (ref 12.0–15.0)
Immature Granulocytes: 0 %
Lymphocytes Relative: 29 %
Lymphs Abs: 1.4 10*3/uL (ref 0.7–4.0)
MCH: 31.6 pg (ref 26.0–34.0)
MCHC: 34 g/dL (ref 30.0–36.0)
MCV: 92.8 fL (ref 80.0–100.0)
Monocytes Absolute: 0.4 10*3/uL (ref 0.1–1.0)
Monocytes Relative: 8 %
Neutro Abs: 2.9 10*3/uL (ref 1.7–7.7)
Neutrophils Relative %: 60 %
Platelet Count: 257 10*3/uL (ref 150–400)
RBC: 4.18 MIL/uL (ref 3.87–5.11)
RDW: 11.9 % (ref 11.5–15.5)
WBC Count: 4.8 10*3/uL (ref 4.0–10.5)
nRBC: 0 % (ref 0.0–0.2)

## 2020-02-22 LAB — CMP (CANCER CENTER ONLY)
ALT: 17 U/L (ref 0–44)
AST: 17 U/L (ref 15–41)
Albumin: 3.9 g/dL (ref 3.5–5.0)
Alkaline Phosphatase: 77 U/L (ref 38–126)
Anion gap: 7 (ref 5–15)
BUN: 16 mg/dL (ref 6–20)
CO2: 26 mmol/L (ref 22–32)
Calcium: 9 mg/dL (ref 8.9–10.3)
Chloride: 108 mmol/L (ref 98–111)
Creatinine: 0.79 mg/dL (ref 0.44–1.00)
GFR, Estimated: >60 mL/min (ref >60)
Glucose, Bld: 79 mg/dL (ref 70–99)
Potassium: 4.1 mmol/L (ref 3.5–5.1)
Sodium: 141 mmol/L (ref 135–145)
Total Bilirubin: 0.7 mg/dL (ref 0.3–1.2)
Total Protein: 7.2 g/dL (ref 6.5–8.1)

## 2020-02-22 NOTE — Telephone Encounter (Signed)
Scheduled appts per 11/15 los. Pt declined print out of AVS and stated she would refer to mychart.

## 2020-02-22 NOTE — Assessment & Plan Note (Signed)
08/20/2018:Right lumpectomy: Grade 2 IDC with DCIS, 2.2 cm, margins negative, 1/1 lymph node positive with focal extranodal extension, ER 50%, PR 50%, HER-2 positive, 3+, Ki-67 10 to 15%, T2N1  Recommendation: 1.Adjuvant chemotherapy with TCH Perjeta completed 01/07/2019 followed by Herceptin Perjeta maintenance 2.Adjuvant radiation at [facility] completed 03/23/2019 3.Adjuvant antiestrogen therapy with tamoxifen x10 years 4.Adjuvant neratinib ---------------------------------------------------------------------------------------------------------------------------------------- Current treatment:adjuvant Tamoxifen10 mg dailywith Neratinib (started9/15/2021) Echocardiogram2/19/2021: EF 60 to 65% Arthralgias:Joint symptoms improved with lower dosage of tamoxifen.  Neratinib toxicities: 1.  Severe diarrhea: We held neratinib.  This is in spite of starting her at a lower dosage.  Lomotil finally stopped the diarrhea. We restarted neratinib at 2 tablets daily and increase to 3 tablets daily.  RTC in 2 months with labs and follow-up

## 2020-03-08 ENCOUNTER — Other Ambulatory Visit: Payer: Self-pay | Admitting: Hematology and Oncology

## 2020-03-08 DIAGNOSIS — Z170 Estrogen receptor positive status [ER+]: Secondary | ICD-10-CM

## 2020-03-08 DIAGNOSIS — C50411 Malignant neoplasm of upper-outer quadrant of right female breast: Secondary | ICD-10-CM

## 2020-03-08 MED ORDER — NERATINIB MALEATE 40 MG PO TABS: ORAL_TABLET | ORAL | 3 refills | Status: DC

## 2020-03-09 ENCOUNTER — Other Ambulatory Visit: Payer: Self-pay | Admitting: Pharmacist

## 2020-03-09 DIAGNOSIS — C50411 Malignant neoplasm of upper-outer quadrant of right female breast: Secondary | ICD-10-CM

## 2020-03-09 DIAGNOSIS — Z170 Estrogen receptor positive status [ER+]: Secondary | ICD-10-CM

## 2020-03-09 MED ORDER — NERATINIB MALEATE 40 MG PO TABS: ORAL_TABLET | ORAL | 3 refills | Status: DC

## 2020-03-09 NOTE — Progress Notes (Signed)
Oral Oncology Pharmacist Encounter  Prescription refill for Nerlynx (neratinib) sent to [facility] in error. Patient's insurance now requires patient fill through [facility]. Prescription redirected to [facility].  Leron Croak, PharmD, BCPS Hematology/Oncology Clinical Pharmacist [facility] [phone] 03/09/2020 7:57 AM

## 2020-03-17 ENCOUNTER — Other Ambulatory Visit: Payer: Self-pay | Admitting: *Deleted

## 2020-03-17 DIAGNOSIS — C50411 Malignant neoplasm of upper-outer quadrant of right female breast: Secondary | ICD-10-CM

## 2020-03-17 MED ORDER — NERATINIB MALEATE 40 MG PO TABS: ORAL_TABLET | ORAL | 3 refills | Status: DC

## 2020-03-17 NOTE — Telephone Encounter (Signed)
This RN spoke with pt per her call regarding recent fill of Nerlynx .  She states they only sent 60 tabs and she is resuming tapering to up to 5 tablets a day as discussed per MD visit.  Noted prescription stated 2 tabs daily as directed by MD.  This RN discussed above and will send refill with increased dispense amount to reflect goal of 5 tabs a day per taper protocol.  Keaton stated understanding of the taper ( she did it  before but due to break in therapy she has to restart the taper ) .  Refill sent to verified pharmacy.

## 2020-03-18 ENCOUNTER — Other Ambulatory Visit: Payer: Self-pay | Admitting: *Deleted

## 2020-03-18 DIAGNOSIS — Z170 Estrogen receptor positive status [ER+]: Secondary | ICD-10-CM

## 2020-03-18 DIAGNOSIS — C50411 Malignant neoplasm of upper-outer quadrant of right female breast: Secondary | ICD-10-CM

## 2020-03-18 MED ORDER — NERATINIB MALEATE 40 MG PO TABS: ORAL_TABLET | ORAL | 3 refills | Status: DC

## 2020-04-18 ENCOUNTER — Other Ambulatory Visit: Payer: Self-pay | Admitting: *Deleted

## 2020-04-18 ENCOUNTER — Encounter: Payer: Self-pay | Admitting: Hematology and Oncology

## 2020-04-18 DIAGNOSIS — Z170 Estrogen receptor positive status [ER+]: Secondary | ICD-10-CM

## 2020-04-18 DIAGNOSIS — C50411 Malignant neoplasm of upper-outer quadrant of right female breast: Secondary | ICD-10-CM

## 2020-04-18 MED ORDER — TAMOXIFEN CITRATE 10 MG PO TABS: 10.0000 mg | ORAL_TABLET | Freq: Two times a day (BID) | ORAL | 3 refills | Status: DC

## 2020-05-24 NOTE — Assessment & Plan Note (Signed)
08/20/2018:Right lumpectomy: Grade 2 IDC with DCIS, 2.2 cm, margins negative, 1/1 lymph node positive with focal extranodal extension, ER 50%, PR 50%, HER-2 positive, 3+, Ki-67 10 to 15%, T2N1  Recommendation: 1.Adjuvant chemotherapy with TCH Perjeta completed 01/07/2019 followed by Herceptin Perjeta maintenance 2.Adjuvant radiation at [facility] completed 03/23/2019 3.Adjuvant antiestrogen therapy with tamoxifen x10 years 4.Adjuvant neratinib ---------------------------------------------------------------------------------------------------------------------------------------- Current treatment:adjuvant Tamoxifen10 mg dailywith Neratinib (startedAugust 2021) Echocardiogram2/19/2021: EF 60 to 65% Arthralgias:Joint symptoms improved with lower dosage of tamoxifen.  Neratinibtoxicities: 1.Severe diarrhea: We held neratinib and restarted at 2 tabs daily. Shes upto  RTC in 3 months with labs and follow-up

## 2020-05-24 NOTE — Progress Notes (Signed)
HEMATOLOGY-ONCOLOGY PROGRESS NOTE    CHIEF COMPLIANT: Follow-up of right breast canceron tamoxifen, neratinib  INTERVAL HISTORY: Megan Acevedo is a 50 y.o. female with above-mentioned history of HER-2 positive right breast cancer who underwent alumpectomy,adjuvant chemotherapy,radiation,Herceptin Perjeta maintenance,andis currently onantiestrogen therapy with tamoxifenand neratinib. She presents todayfora toxicity check.She is able to tolerate her 5 tablets of neratinib at this time.  She gets intermittent diarrhea for which she takes Imodium.  She is able to manage her lifestyle to handle the loose stools.   Oncology History  Malignant neoplasm of upper-outer quadrant of right breast in female, estrogen receptor positive ([place])  06/26/2018 Initial Diagnosis   [place]: Screening mammogram detected focal asymmetry in the right breast, ultrasound revealed 2 cm oval mass: Biopsy revealed grade 1-2 invasive ductal carcinoma, ER 3+, PR 3+, HER-2 3+ positive T1c M0 stage Ia clinical stage   07/23/2018 Cancer Staging   Staging form: Breast, AJCC 8th Edition - Clinical stage from 07/23/2018: Stage IA (cT1c, cN0, cM0, G2, ER+, PR+, HER2+)   07/2018 Genetic Testing   Genetic testing in [place], [place], negative   08/20/2018 Surgery   Right lumpectomy Donne Hazel) [phone]): Grade 2 IDC with DCIS, 2.2 cm, margins negative, 1/1 lymph node positive with focal extranodal extension, ER 50%, PR 50%, HER-2 positive, 3+, Ki-67 10 to 15%, T2N1   09/03/2018 Cancer Staging   Staging form: Breast, AJCC 8th Edition - Pathologic: Stage IB (pT2, pN1a, cM0, G2, ER+, PR+, HER2+) - Signed by Gardenia Phlegm, NP on 09/03/2018   10/04/2018 - 05/22/2019 Chemotherapy   palonosetron (ALOXI) injection 0.25 mg, 0.25 mg, Intravenous,  Once, 6 of 6 cycles Administration: 0.25 mg (10/15/2018), 0.25 mg (11/05/2018), 0.25 mg (12/17/2018), 0.25 mg (01/07/2019), 0.25 mg (11/26/2018), 0.25 mg  (09/24/2018)  pegfilgrastim-cbqv (UDENYCA) injection 6 mg, 6 mg, Subcutaneous, Once, 6 of 6 cycles. Administration: 6 mg (09/25/2018), 6 mg (10/17/2018), 6 mg (11/06/2018), 6 mg (12/19/2018), 6 mg (01/08/2019), 6 mg (11/28/2018)  trastuzumab (HERCEPTIN) 504 mg in sodium chloride 0.9 % 250 mL chemo infusion, 8 mg/kg = 504 mg, Intravenous,  Once, 9 of 14 cycles. Dose modification: 6 mg/kg (original dose 6 mg/kg, Cycle 7, Reason: Other (see comments), Comment: Insurance will only approve Herceptin at this time.), 6 mg/kg (original dose 6 mg/kg, Cycle 12, Reason: Other (see comments), Comment: pt did not tolerate Kanjinti (itching)). Administration: 378 mg (10/15/2018), 378 mg (11/05/2018), 504 mg (09/24/2018), 378 mg (01/28/2019), 378 mg (02/18/2019), 378 mg (03/11/2019), 378 mg (04/01/2019), 378 mg (05/22/2019)  CARBOplatin (PARAPLATIN) 700 mg in sodium chloride 0.9 % 250 mL chemo infusion, 700 mg (100 % of original dose 700 mg), Intravenous,  Once, 6 of 6 cycles. Dose modification: 700 mg (original dose 700 mg, Cycle 1). Administration: 700 mg (10/15/2018), 700 mg (11/05/2018), 700 mg (12/17/2018), 700 mg (01/07/2019), 700 mg (11/26/2018), 700 mg (09/24/2018)  DOCEtaxel (TAXOTERE) 130 mg in sodium chloride 0.9 % 250 mL chemo infusion, 75 mg/m2 = 130 mg, Intravenous,  Once, 6 of 6 cycles. Administration: 130 mg (10/15/2018), 130 mg (11/05/2018), 130 mg (12/17/2018), 130 mg (01/07/2019), 130 mg (11/26/2018), 130 mg (09/24/2018)  pertuzumab (PERJETA) 420 mg in sodium chloride 0.9 % 250 mL chemo infusion, 420 mg (50 % of original dose 840 mg), Intravenous, Once, 12 of 17 cycles. Dose modification: 420 mg (original dose 840 mg, Cycle 1, Reason: Provider Judgment). Administration: 420 mg (10/15/2018), 420 mg (11/05/2018), 420 mg (12/17/2018), 420 mg (01/07/2019), 420 mg (01/28/2019), 420 mg (03/11/2019), 420 mg (04/01/2019), 420 mg (11/26/2018),  420 mg (02/18/2019), 420 mg (04/24/2019), 420 mg (05/22/2019), 420 mg (09/24/2018)  fosaprepitant (EMEND)  150 mg   dexamethasone (DECADRON) 12 mg in sodium chloride 0.9 % 145 mL IVPB, , Intravenous,  Once, 6 of 6 cycles. Administration:  (10/15/2018),  (11/05/2018),  (12/17/2018),  (01/07/2019),  (11/26/2018),  (09/24/2018)  trastuzumab-dkst (OGIVRI) 378 mg in sodium chloride 0.9 % 250 mL chemo infusion, 6 mg/kg = 378 mg (100 % of original dose 6 mg/kg), Intravenous,  Once, 3 of 3 cycles. Dose modification: 6 mg/kg (original dose 6 mg/kg, Cycle 4, Reason: Other (see comments), Comment: Biosimilar Conversion). Administration: 378 mg (11/26/2018), 378 mg (12/17/2018), 378 mg (01/07/2019)     - 03/23/2019 Radiation Therapy   Completed at [facility]   03/2019 - 03/2024 Anti-estrogen oral therapy   Tamoxifen     Observations/Objective:  Today's Vitals   05/25/20 0910  BP: (!) 132/95  Pulse: 95  Resp: 17  Temp: 97.9 F (36.6 C)  TempSrc: Temporal  SpO2: 100%  Weight: 141 lb 6.4 oz (64.1 kg)  Height: _0  (1.651 m)   Body mass index is 23.53 kg/m.  I have reviewed the data as listed CMP Latest Ref Rng & Units 05/25/2020 02/22/2020 12/23/2019  Glucose 70 - 99 mg/dL 73 79 77  BUN 6 - 20 mg/dL _1 Creatinine 0.44 - 1.00 mg/dL 0.80 0.79 0.78  Sodium 135 - 145 mmol/L 140 141 140  Potassium 3.5 - 5.1 mmol/L 4.2 4.1 4.1  Chloride 98 - 111 mmol/L 110 108 107  CO2 22 - 32 mmol/L _2 Calcium 8.9 - 10.3 mg/dL 8.9 9.0 9.4  Total Protein 6.5 - 8.1 g/dL 7.2 7.2 7.1  Total Bilirubin 0.3 - 1.2 mg/dL 0.3 0.7 0.4  Alkaline Phos 38 - 126 U/L 84 77 73  AST 15 - 41 U/L 15 17 13(L)  ALT 0 - 44 U/L _3 Lab Results  Component Value Date   WBC 5.0 05/25/2020   HGB 13.0 05/25/2020   HCT 38.5 05/25/2020   MCV 92.8 05/25/2020   PLT 218 05/25/2020   NEUTROABS 2.8 05/25/2020      Assessment Plan:  Malignant neoplasm of upper-outer quadrant of right breast in female, estrogen receptor positive ([place]) 08/20/2018:Right lumpectomy: Grade 2 IDC with DCIS, 2.2 cm, margins negative, 1/1 lymph node  positive with focal extranodal extension, ER 50%, PR 50%, HER-2 positive, 3+, Ki-67 10 to 15%, T2N1  Recommendation: 1.Adjuvant chemotherapy with TCH Perjeta completed 01/07/2019 followed by Herceptin Perjeta maintenance 2.Adjuvant radiation at [facility] completed 03/23/2019 3.Adjuvant antiestrogen therapy with tamoxifen x10 years 4.Adjuvant neratinib ---------------------------------------------------------------------------------------------------------------------------------------- Current treatment:adjuvant Tamoxifen10 mg dailywith Neratinib (startedAugust 2021) Echocardiogram2/19/2021: EF 60 to 65% Arthralgias:Joint symptoms improved with lower dosage of tamoxifen.  Neratinibtoxicities: Severe diarrhea: We held neratinib and restarted at 2 tabs daily. Shes upto5 tablets daily. She is able to manage her diarrhea with intermittent Imodium.   She had COVID-19 infection a month ago. RTC in 3 months with labs and follow-up    I discussed the assessment and treatment plan with the patient. The patient was provided an opportunity to ask questions and all were answered. The patient agreed with the plan and demonstrated an understanding of the instructions. The patient was advised to call back or seek an in-person evaluation if the symptoms worsen or if the condition fails to improve as anticipated.   Total time spent: 30 minutes including face-to-face MyChart video visit time and time spent for planning,  charting and coordination of care  Rulon Eisenmenger, MD 05/25/2020   I, Cloyde Reams Dorshimer, am acting as scribe for Nicholas Lose, MD.  I have reviewed the above documentation for accuracy and completeness, and I agree with the above.

## 2020-05-25 ENCOUNTER — Telehealth: Payer: Self-pay | Admitting: Hematology and Oncology

## 2020-05-25 ENCOUNTER — Encounter: Payer: Self-pay | Admitting: Pharmacist

## 2020-05-25 ENCOUNTER — Inpatient Hospital Stay: Payer: BC Managed Care – PPO

## 2020-05-25 ENCOUNTER — Other Ambulatory Visit: Payer: Self-pay

## 2020-05-25 ENCOUNTER — Inpatient Hospital Stay: Payer: BC Managed Care – PPO | Attending: Hematology and Oncology | Admitting: Hematology and Oncology

## 2020-05-25 DIAGNOSIS — R197 Diarrhea, unspecified: Secondary | ICD-10-CM | POA: Diagnosis not present

## 2020-05-25 DIAGNOSIS — Z9221 Personal history of antineoplastic chemotherapy: Secondary | ICD-10-CM | POA: Insufficient documentation

## 2020-05-25 DIAGNOSIS — C50411 Malignant neoplasm of upper-outer quadrant of right female breast: Secondary | ICD-10-CM

## 2020-05-25 DIAGNOSIS — Z8616 Personal history of COVID-19: Secondary | ICD-10-CM | POA: Insufficient documentation

## 2020-05-25 DIAGNOSIS — Z170 Estrogen receptor positive status [ER+]: Secondary | ICD-10-CM

## 2020-05-25 DIAGNOSIS — C773 Secondary and unspecified malignant neoplasm of axilla and upper limb lymph nodes: Secondary | ICD-10-CM | POA: Diagnosis present

## 2020-05-25 DIAGNOSIS — Z923 Personal history of irradiation: Secondary | ICD-10-CM | POA: Insufficient documentation

## 2020-05-25 DIAGNOSIS — Z79810 Long term (current) use of selective estrogen receptor modulators (SERMs): Secondary | ICD-10-CM | POA: Diagnosis not present

## 2020-05-25 LAB — CMP (CANCER CENTER ONLY)
ALT: 18 U/L (ref 0–44)
AST: 15 U/L (ref 15–41)
Albumin: 4 g/dL (ref 3.5–5.0)
Alkaline Phosphatase: 84 U/L (ref 38–126)
Anion gap: 6 (ref 5–15)
BUN: 10 mg/dL (ref 6–20)
CO2: 24 mmol/L (ref 22–32)
Calcium: 8.9 mg/dL (ref 8.9–10.3)
Chloride: 110 mmol/L (ref 98–111)
Creatinine: 0.8 mg/dL (ref 0.44–1.00)
GFR, Estimated: >60 mL/min (ref >60)
Glucose, Bld: 73 mg/dL (ref 70–99)
Potassium: 4.2 mmol/L (ref 3.5–5.1)
Sodium: 140 mmol/L (ref 135–145)
Total Bilirubin: 0.3 mg/dL (ref 0.3–1.2)
Total Protein: 7.2 g/dL (ref 6.5–8.1)

## 2020-05-25 LAB — CBC WITH DIFFERENTIAL (CANCER CENTER ONLY)
Abs Immature Granulocytes: 0 10*3/uL (ref 0.00–0.07)
Basophils Absolute: 0 10*3/uL (ref 0.0–0.1)
Basophils Relative: 1 %
Eosinophils Absolute: 0.1 10*3/uL (ref 0.0–0.5)
Eosinophils Relative: 2 %
HCT: 38.5 % (ref 36.0–46.0)
Hemoglobin: 13 g/dL (ref 12.0–15.0)
Immature Granulocytes: 0 %
Lymphocytes Relative: 32 %
Lymphs Abs: 1.6 10*3/uL (ref 0.7–4.0)
MCH: 31.3 pg (ref 26.0–34.0)
MCHC: 33.8 g/dL (ref 30.0–36.0)
MCV: 92.8 fL (ref 80.0–100.0)
Monocytes Absolute: 0.4 10*3/uL (ref 0.1–1.0)
Monocytes Relative: 8 %
Neutro Abs: 2.8 10*3/uL (ref 1.7–7.7)
Neutrophils Relative %: 57 %
Platelet Count: 218 10*3/uL (ref 150–400)
RBC: 4.15 MIL/uL (ref 3.87–5.11)
RDW: 12 % (ref 11.5–15.5)
WBC Count: 5 10*3/uL (ref 4.0–10.5)
nRBC: 0 % (ref 0.0–0.2)

## 2020-05-25 NOTE — Telephone Encounter (Signed)
Scheduled appts per 2/16 los. Pt declined printout of AVS.

## 2020-05-25 NOTE — Progress Notes (Signed)
Oncology Clinical Pharmacist Follow Up Note   Megan Acevedo was contacted via in-person visit to discuss her oral chemotherapy regimen for neratinib.   She continues on neratinib 200 mg (5 tabs) by mouth daily on days 1 to 28 of a 28-day cycle. This is being given in combination with tamoxifen. Therapy is planned to continue until August 2022 (1 year of adjuvant therapy. Cycle 1, day 1 start date was August 2021. Tamoxifen to continue monotherapy after August 2022.  Response to Therapy:  Megan Acevedo saw Dr. Lindi Adie today in clinic and appears to be responding well to therapy.  She describes minimal toxicity with neratinib.  She had been on 4 tablets back in November 2021 and had planned to increase to 5 tablets but then had insurance changes at the beginning of the year.  At that point, she had a delay in treatment and started back with 2 tabs daily.  She recently increased to 5 tabs daily on 05/14/20. She takes this in the evening and the tamoxifen in the morning.  She is aware to take the neratinib with food and avoid taking her Centrum Silver and neratinib at the same time (separate neratinib by at least 3 hours per manufacturer guidelines). Dr. Lindi Adie reviewed labs with Megan Acevedo and both patient and provider are in agreement to continue therapy with neratinib at this time.  No Known Allergies  Labs:  CBC    Component Value Date/Time   WBC 5.0 05/25/2020 0844   RBC 4.15 05/25/2020 0844   HGB 13.0 05/25/2020 0844   HCT 38.5 05/25/2020 0844   PLT 218 05/25/2020 0844   MCV 92.8 05/25/2020 0844   MCH 31.3 05/25/2020 0844   MCHC 33.8 05/25/2020 0844   RDW 12.0 05/25/2020 0844   LYMPHSABS 1.6 05/25/2020 0844   MONOABS 0.4 05/25/2020 0844   EOSABS 0.1 05/25/2020 0844   BASOSABS 0.0 05/25/2020 0844    Lab Results  Component Value Date   NEUTROABS 2.8 05/25/2020     CMP     Component Value Date/Time   NA 140 05/25/2020 0844   K 4.2 05/25/2020 0844   CL 110 05/25/2020 0844   CO2  24 05/25/2020 0844   GLUCOSE 73 05/25/2020 0844   BUN 10 05/25/2020 0844   CREATININE 0.80 05/25/2020 0844   CALCIUM 8.9 05/25/2020 0844   PROT 7.2 05/25/2020 0844   ALBUMIN 4.0 05/25/2020 0844   AST 15 05/25/2020 0844   ALT 18 05/25/2020 0844   ALKPHOS 84 05/25/2020 0844   BILITOT 0.3 05/25/2020 0844   GFRNONAA >60 05/25/2020 0844   GFRAA >60 12/23/2019 0815     Adverse Effects Assessment . Minimal diarrhea alleviated with loperamide PRN. On rare occasion she will use Lomotil.  Adherence Assessment . Patient was re-educated on importance of adherence.  . She knows to take the neratinib at the same time every day  Access Assessment . Megan Acevedo is currently receiving her neratinib through [facility]  . Insurance concerns:  none . Grant information: none  Medication Reconciliation . The patient's medication list was reviewed today with the patient? yes . New medications or herbal supplements have recently been started? No, discussed possible trial of glucosamine for joint pain . Any medications have been discontinued? Yes, prochlorperazine (updated on med list) . The medication list was updated and reconciled based on the patient's most recent medication list in the electronic medical record (EMR) including herbal products and OTC medications.   Drug-Drug Interactions (DDIs) .  DDIs were evaluated? yes . Significant DDIs? No, reviewed Centrum Silver with neratinib . The patient was instructed to speak with their health care provider and/or the oral chemotherapy pharmacist before starting any new drug, including prescription or over the counter, natural / herbal products, or vitamins.  Supportive Care . Reviewed administration of loperamide and that if needed can take up to 8 tablets in a 24 hour period for loose stools . Lomotil every 6 hours as needed for diarrhea refractory to loperamide  Patient Education/Re-education The patient was educated regarding the  following supportive care topics:    diarrhea  Additional education included: reviewed to contact the clinic with questions or concerns prior to her next visit with Dr. Lindi Adie  Dosing Assessment . Hepatic adjustments needed? no . Renal adjustments needed? no . Toxicity adjustments needed? no . The current dosing regimen is appropriate to continue at this time.  Follow-Up Plan . Will see Megan Acevedo during her next visit with Dr. Lindi Adie  Recommendations for next cycle: continue on current treatment regimen  The patient was encouraged to call [phone] with questions.  Patient verbalized understanding of the information provided.   I spent 15 minutes assessing and educating the patient.  Raina Mina, [place], 05/25/2020  10:01 AM

## 2020-07-07 ENCOUNTER — Other Ambulatory Visit (HOSPITAL_COMMUNITY): Payer: Self-pay

## 2020-07-25 ENCOUNTER — Encounter: Payer: Self-pay | Admitting: *Deleted

## 2020-07-25 ENCOUNTER — Encounter: Payer: Self-pay | Admitting: Hematology and Oncology

## 2020-07-26 ENCOUNTER — Other Ambulatory Visit: Payer: Self-pay | Admitting: Hematology and Oncology

## 2020-07-26 DIAGNOSIS — Z170 Estrogen receptor positive status [ER+]: Secondary | ICD-10-CM

## 2020-07-26 DIAGNOSIS — C50411 Malignant neoplasm of upper-outer quadrant of right female breast: Secondary | ICD-10-CM

## 2020-08-15 IMAGING — CR PORTABLE CHEST - 1 VIEW
1 series · 1 of 1 positions shown · non-contrast
Comparison: None.

CLINICAL DATA: Status post right-sided Port-A-Cath placement.

EXAM:
PORTABLE CHEST 1 VIEW

[chest ap]
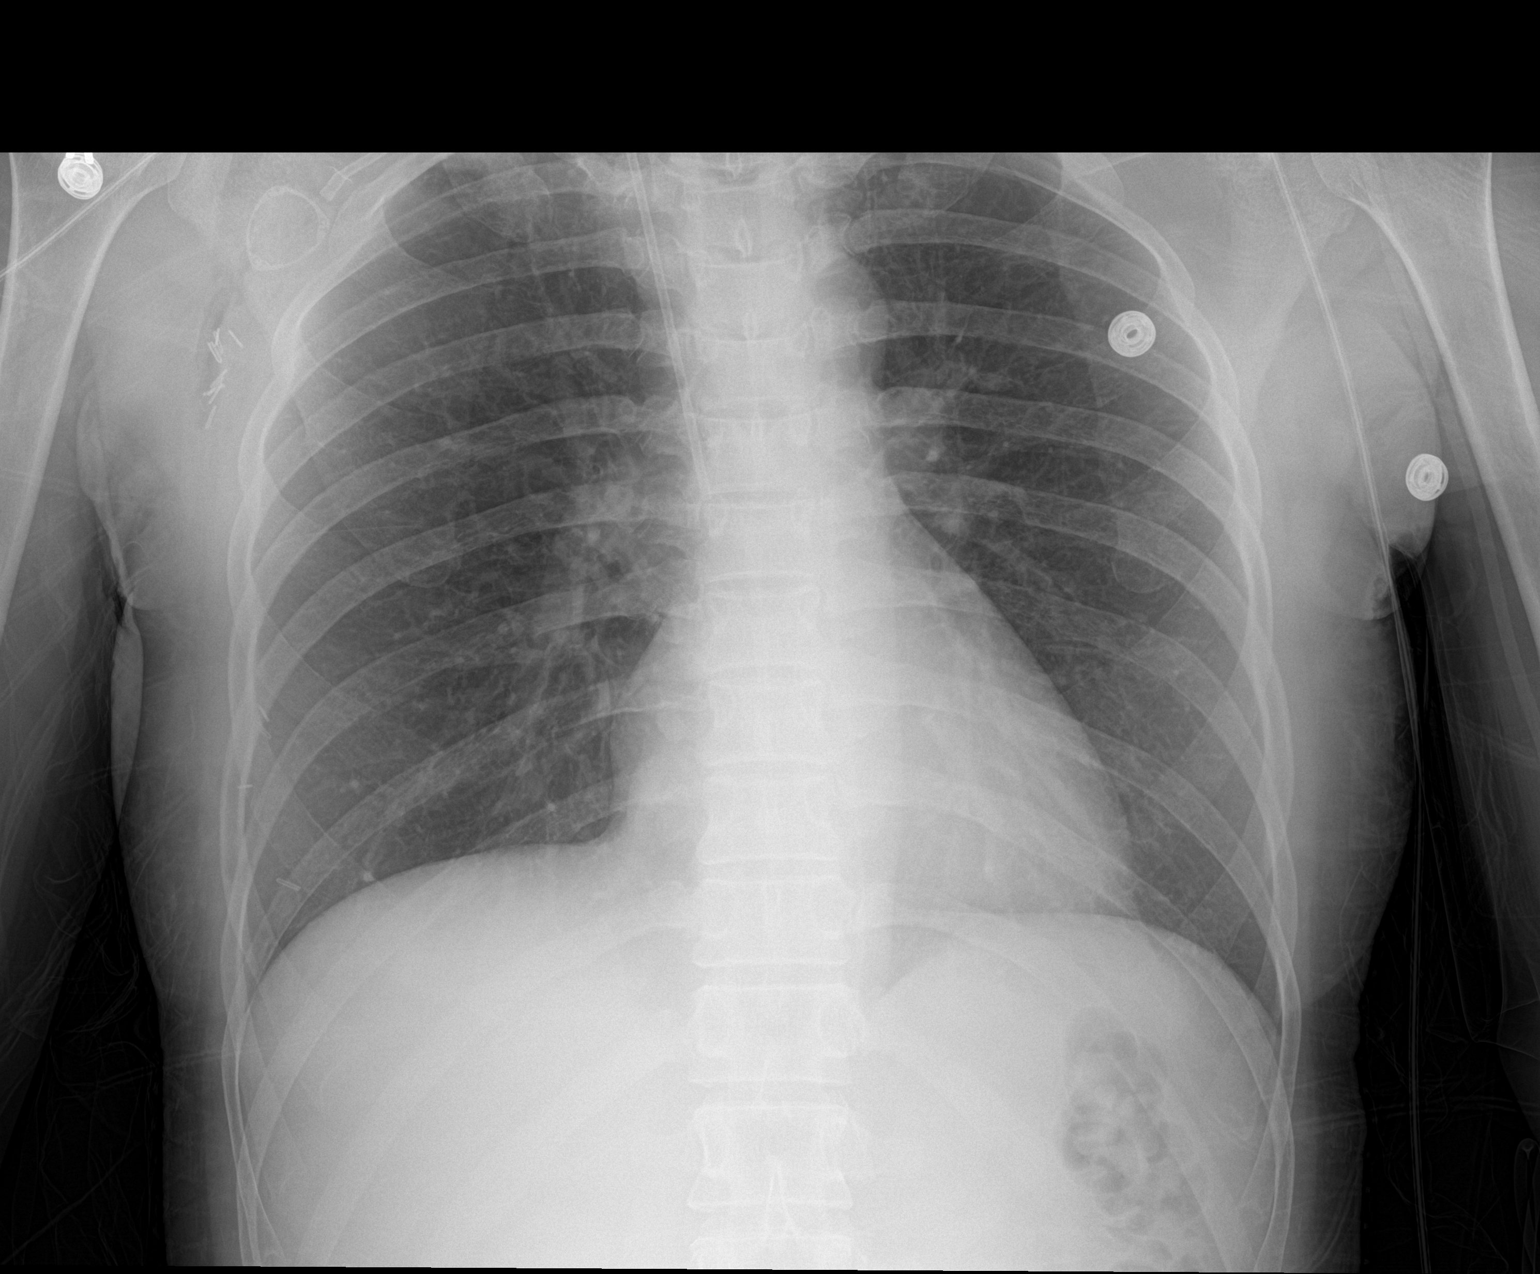

[1 of 1 positions shown; findings below may reference images not displayed]

FINDINGS: There is a right-sided Port-A-Cath in place with tip terminating
near the mid to distal SVC. Some subcutaneous gas is noted over the
right chest wall, likely postsurgical in etiology. There is no
evidence of a right-sided pneumothorax. Surgical clips are noted in
the right axilla. No large pleural effusion. No large area of
consolidation. The cardiac silhouette is unremarkable.
IMPRESSION: Port-A-Cath as above with no evidence of a pneumothorax.

## 2020-08-22 ENCOUNTER — Other Ambulatory Visit: Payer: Self-pay | Admitting: Adult Health

## 2020-08-22 DIAGNOSIS — Z1231 Encounter for screening mammogram for malignant neoplasm of breast: Secondary | ICD-10-CM

## 2020-08-25 NOTE — Assessment & Plan Note (Signed)
08/20/2018:Right lumpectomy: Grade 2 IDC with DCIS, 2.2 cm, margins negative, 1/1 lymph node positive with focal extranodal extension, ER 50%, PR 50%, HER-2 positive, 3+, Ki-67 10 to 15%, T2N1  Recommendation: 1.Adjuvant chemotherapy with TCH Perjeta completed 01/07/2019 followed by Herceptin Perjeta maintenance 2.Adjuvant radiation at [facility] completed 03/23/2019 3.Adjuvant antiestrogen therapy with tamoxifen x10 years 4.Adjuvant neratinib ---------------------------------------------------------------------------------------------------------------------------------------- Current treatment:adjuvant Tamoxifen10 mg dailywith Neratinib (startedAugust2021) Echocardiogram2/19/2021: EF 60 to 65% Arthralgias:Joint symptoms improved with lower dosage of tamoxifen.  Neratinibtoxicities: Severe diarrhea: We held neratinib and restarted at 2 tabs daily. Shes upto5 tablets daily. She is able to manage her diarrhea with intermittent Imodium.   She had COVID-19 infection a month ago. RTC in18month with labs and follow-up

## 2020-08-25 NOTE — Progress Notes (Signed)
Patient Care Team: [name], MD as PCP - General (Internal Medicine) Mauro Kaufmann, RN as Oncology Nurse Navigator [name], RN as Oncology Nurse Navigator [name], MD as Consulting Physician (General Surgery) Nicholas Lose, MD as Consulting Physician (Hematology and Oncology)  DIAGNOSIS:    ICD-10-CM   1. Malignant neoplasm of upper-outer quadrant of right breast in female, estrogen receptor positive ([place])  C50.411    Z17.0     SUMMARY OF ONCOLOGIC HISTORY: Oncology History  Malignant neoplasm of upper-outer quadrant of right breast in female, estrogen receptor positive ([place])  06/26/2018 Initial Diagnosis   [place]: Screening mammogram detected focal asymmetry in the right breast, ultrasound revealed 2 cm oval mass: Biopsy revealed grade 1-2 invasive ductal carcinoma, ER 3+, PR 3+, HER-2 3+ positive T1c M0 stage Ia clinical stage   07/23/2018 Cancer Staging   Staging form: Breast, AJCC 8th Edition - Clinical stage from 07/23/2018: Stage IA (cT1c, cN0, cM0, G2, ER+, PR+, HER2+)   07/2018 Genetic Testing   Genetic testing in [place], [place], negative   08/20/2018 Surgery   Right lumpectomy Donne Hazel) [phone]): Grade 2 IDC with DCIS, 2.2 cm, margins negative, 1/1 lymph node positive with focal extranodal extension, ER 50%, PR 50%, HER-2 positive, 3+, Ki-67 10 to 15%, T2N1   09/03/2018 Cancer Staging   Staging form: Breast, AJCC 8th Edition - Pathologic: Stage IB (pT2, pN1a, cM0, G2, ER+, PR+, HER2+) - Signed by Gardenia Phlegm, NP on 09/03/2018   10/04/2018 - 05/22/2019 Chemotherapy   palonosetron (ALOXI) injection 0.25 mg, 0.25 mg, Intravenous,  Once, 6 of 6 cycles Administration: 0.25 mg (10/15/2018), 0.25 mg (11/05/2018), 0.25 mg (12/17/2018), 0.25 mg (01/07/2019), 0.25 mg (11/26/2018), 0.25 mg (09/24/2018)  pegfilgrastim-cbqv (UDENYCA) injection 6 mg, 6 mg, Subcutaneous, Once, 6 of 6 cycles. Administration: 6 mg (09/25/2018), 6 mg  (10/17/2018), 6 mg (11/06/2018), 6 mg (12/19/2018), 6 mg (01/08/2019), 6 mg (11/28/2018)  trastuzumab (HERCEPTIN) 504 mg in sodium chloride 0.9 % 250 mL chemo infusion, 8 mg/kg = 504 mg, Intravenous,  Once, 9 of 14 cycles. Dose modification: 6 mg/kg (original dose 6 mg/kg, Cycle 7, Reason: Other (see comments), Comment: Insurance will only approve Herceptin at this time.), 6 mg/kg (original dose 6 mg/kg, Cycle 12, Reason: Other (see comments), Comment: pt did not tolerate Kanjinti (itching)). Administration: 378 mg (10/15/2018), 378 mg (11/05/2018), 504 mg (09/24/2018), 378 mg (01/28/2019), 378 mg (02/18/2019), 378 mg (03/11/2019), 378 mg (04/01/2019), 378 mg (05/22/2019)  CARBOplatin (PARAPLATIN) 700 mg in sodium chloride 0.9 % 250 mL chemo infusion, 700 mg (100 % of original dose 700 mg), Intravenous,  Once, 6 of 6 cycles. Dose modification: 700 mg (original dose 700 mg, Cycle 1). Administration: 700 mg (10/15/2018), 700 mg (11/05/2018), 700 mg (12/17/2018), 700 mg (01/07/2019), 700 mg (11/26/2018), 700 mg (09/24/2018)  DOCEtaxel (TAXOTERE) 130 mg in sodium chloride 0.9 % 250 mL chemo infusion, 75 mg/m2 = 130 mg, Intravenous,  Once, 6 of 6 cycles. Administration: 130 mg (10/15/2018), 130 mg (11/05/2018), 130 mg (12/17/2018), 130 mg (01/07/2019), 130 mg (11/26/2018), 130 mg (09/24/2018)  pertuzumab (PERJETA) 420 mg in sodium chloride 0.9 % 250 mL chemo infusion, 420 mg (50 % of original dose 840 mg), Intravenous, Once, 12 of 17 cycles. Dose modification: 420 mg (original dose 840 mg, Cycle 1, Reason: Provider Judgment). Administration: 420 mg (10/15/2018), 420 mg (11/05/2018), 420 mg (12/17/2018), 420 mg (01/07/2019), 420 mg (01/28/2019), 420 mg (03/11/2019), 420 mg (04/01/2019), 420 mg (11/26/2018), 420 mg (02/18/2019), 420 mg (  04/24/2019), 420 mg (05/22/2019), 420 mg (09/24/2018)  fosaprepitant (EMEND) 150 mg   dexamethasone (DECADRON) 12 mg in sodium chloride 0.9 % 145 mL IVPB, , Intravenous,  Once, 6 of 6 cycles. Administration:   (10/15/2018),  (11/05/2018),  (12/17/2018),  (01/07/2019),  (11/26/2018),  (09/24/2018)  trastuzumab-dkst (OGIVRI) 378 mg in sodium chloride 0.9 % 250 mL chemo infusion, 6 mg/kg = 378 mg (100 % of original dose 6 mg/kg), Intravenous,  Once, 3 of 3 cycles. Dose modification: 6 mg/kg (original dose 6 mg/kg, Cycle 4, Reason: Other (see comments), Comment: Biosimilar Conversion). Administration: 378 mg (11/26/2018), 378 mg (12/17/2018), 378 mg (01/07/2019)     - 03/23/2019 Radiation Therapy   Completed at [facility]   03/2019 - 03/2024 Anti-estrogen oral therapy   Tamoxifen     CHIEF COMPLIANT: Follow-up of right breast canceron tamoxifen, neratinib  INTERVAL HISTORY: Megan Acevedo is a 50 y.o. with above-mentioned history of HER-2 positive right breast cancer who underwent alumpectomy,adjuvant chemotherapy,radiation,Herceptin Perjeta maintenance,andis currently onantiestrogen therapy with tamoxifenand neratinib. She presents todayfora toxicity check.  ALLERGIES:  has No Known Allergies.  MEDICATIONS:  Current Outpatient Medications  Medication Sig Dispense Refill  . chlorpheniramine (CHLOR-TRIMETON) 4 MG tablet Take 4 mg by mouth 2 (two) times daily as needed for allergies.    . diphenoxylate-atropine (LOMOTIL) 2.5-0.025 MG tablet Take 1 tablet by mouth 4 (four) times daily as needed for diarrhea or loose stools. 30 tablet 2  . loperamide (IMODIUM) 2 MG capsule Take 2 capsules (4 mg) by mouth three times daily on days 1-14, then 2 caps (4 mg) two times daily on days 15-56, then take as needed days 57-365 for diarrhea or loose stools 150 capsule 3  . Multiple Vitamins-Minerals (CENTRUM SILVER 50+WOMEN) TABS Take 1 each by mouth every morning. (Patient not taking: Reported on 05/25/2020)    . NERLYNX 40 MG tablet TAKE 5 TABLETS (200MG) BY MOUTH ONCE A DAY WITH FOOD. AVOID GRAPEFRUIT PRODUCTS. 150 tablet 2  . tamoxifen (NOLVADEX) 10 MG tablet Take 1 tablet (10 mg total) by mouth 2 (two) times  daily. (Patient taking differently: Take 10 mg by mouth daily. Patient reports taking 10 mg one time daily) 60 tablet 3   No current facility-administered medications for this visit.    PHYSICAL EXAMINATION: ECOG PERFORMANCE STATUS: 1 - Symptomatic but completely ambulatory  Vitals:   08/26/20 0858  BP: (!) 148/78  Pulse: 84  Resp: 18  Temp: 97.7 F (36.5 C)  SpO2: 100%   Filed Weights   08/26/20 0858  Weight: 140 lb 1.6 oz (63.5 kg)     LABORATORY DATA:  I have reviewed the data as listed CMP Latest Ref Rng & Units 05/25/2020 02/22/2020 12/23/2019  Glucose 70 - 99 mg/dL 73 79 77  BUN 6 - 20 mg/dL _0 Creatinine 0.44 - 1.00 mg/dL 0.80 0.79 0.78  Sodium 135 - 145 mmol/L 140 141 140  Potassium 3.5 - 5.1 mmol/L 4.2 4.1 4.1  Chloride 98 - 111 mmol/L 110 108 107  CO2 22 - 32 mmol/L _1 Calcium 8.9 - 10.3 mg/dL 8.9 9.0 9.4  Total Protein 6.5 - 8.1 g/dL 7.2 7.2 7.1  Total Bilirubin 0.3 - 1.2 mg/dL 0.3 0.7 0.4  Alkaline Phos 38 - 126 U/L 84 77 73  AST 15 - 41 U/L 15 17 13(L)  ALT 0 - 44 U/L _2 Lab Results  Component Value Date   WBC 5.0 08/26/2020  HGB 12.9 08/26/2020   HCT 38.9 08/26/2020   MCV 93.5 08/26/2020   PLT 251 08/26/2020   NEUTROABS 2.9 08/26/2020    ASSESSMENT & PLAN:  Malignant neoplasm of upper-outer quadrant of right breast in female, estrogen receptor positive ([place]) 08/20/2018:Right lumpectomy: Grade 2 IDC with DCIS, 2.2 cm, margins negative, 1/1 lymph node positive with focal extranodal extension, ER 50%, PR 50%, HER-2 positive, 3+, Ki-67 10 to 15%, T2N1  Recommendation: 1.Adjuvant chemotherapy with TCH Perjeta completed 01/07/2019 followed by Herceptin Perjeta maintenance 2.Adjuvant radiation at [facility] completed 03/23/2019 3.Adjuvant antiestrogen therapy with tamoxifen x10 years 4.Adjuvant neratinib started  12/09/2019 ---------------------------------------------------------------------------------------------------------------------------------------- Current treatment:adjuvant Tamoxifen10 mg dailywith Neratinib (startedAugust2021) Echocardiogram2/19/2021: EF 60 to 65% Arthralgias:Joint symptoms improved with lower dosage of tamoxifen.  However she still feels stiffness in the right hand.  Therefore we decided to split the tamoxifen 10 mg dose into 2 half and then she will take one half in the morning and one half in the evening.  If that does not improve her symptoms then she will take half a tablet daily after that.  Neratinibtoxicities: Severe diarrhea: We held neratinib and restarted at 2 tabs daily. Shes upto5 tablets daily. She is able to manage her diarrhea with intermittent Imodium. She will complete neratinib by end of August  RTC in80month with labs and follow-up    No orders of the defined types were placed in this encounter.  The patient has a good understanding of the overall plan. she agrees with it. she will call with any problems that may develop before the next visit here.  Total time spent: 30 mins including face to face time and time spent for planning, charting and coordination of care  VRulon Eisenmenger MD, MPH 08/26/2020  I, Molly Dorshimer, am acting as scribe for Dr. VNicholas Lose  I have reviewed the above documentation for accuracy and completeness, and I agree with the above.

## 2020-08-26 ENCOUNTER — Inpatient Hospital Stay: Payer: BC Managed Care – PPO | Attending: Hematology and Oncology | Admitting: Hematology and Oncology

## 2020-08-26 ENCOUNTER — Inpatient Hospital Stay: Payer: BC Managed Care – PPO

## 2020-08-26 ENCOUNTER — Other Ambulatory Visit: Payer: Self-pay

## 2020-08-26 DIAGNOSIS — C773 Secondary and unspecified malignant neoplasm of axilla and upper limb lymph nodes: Secondary | ICD-10-CM | POA: Insufficient documentation

## 2020-08-26 DIAGNOSIS — Z9221 Personal history of antineoplastic chemotherapy: Secondary | ICD-10-CM | POA: Insufficient documentation

## 2020-08-26 DIAGNOSIS — Z79810 Long term (current) use of selective estrogen receptor modulators (SERMs): Secondary | ICD-10-CM | POA: Insufficient documentation

## 2020-08-26 DIAGNOSIS — C50411 Malignant neoplasm of upper-outer quadrant of right female breast: Secondary | ICD-10-CM | POA: Insufficient documentation

## 2020-08-26 DIAGNOSIS — Z923 Personal history of irradiation: Secondary | ICD-10-CM | POA: Diagnosis not present

## 2020-08-26 DIAGNOSIS — Z170 Estrogen receptor positive status [ER+]: Secondary | ICD-10-CM | POA: Diagnosis not present

## 2020-08-26 DIAGNOSIS — Z79899 Other long term (current) drug therapy: Secondary | ICD-10-CM | POA: Diagnosis not present

## 2020-08-26 LAB — CMP (CANCER CENTER ONLY)
ALT: 17 U/L (ref 0–44)
AST: 17 U/L (ref 15–41)
Albumin: 3.9 g/dL (ref 3.5–5.0)
Alkaline Phosphatase: 79 U/L (ref 38–126)
Anion gap: 8 (ref 5–15)
BUN: 17 mg/dL (ref 6–20)
CO2: 26 mmol/L (ref 22–32)
Calcium: 9 mg/dL (ref 8.9–10.3)
Chloride: 108 mmol/L (ref 98–111)
Creatinine: 0.78 mg/dL (ref 0.44–1.00)
GFR, Estimated: >60 mL/min (ref >60)
Glucose, Bld: 91 mg/dL (ref 70–99)
Potassium: 3.8 mmol/L (ref 3.5–5.1)
Sodium: 142 mmol/L (ref 135–145)
Total Bilirubin: 0.5 mg/dL (ref 0.3–1.2)
Total Protein: 7 g/dL (ref 6.5–8.1)

## 2020-08-26 LAB — CBC WITH DIFFERENTIAL (CANCER CENTER ONLY)
Abs Immature Granulocytes: 0.01 10*3/uL (ref 0.00–0.07)
Basophils Absolute: 0 10*3/uL (ref 0.0–0.1)
Basophils Relative: 1 %
Eosinophils Absolute: 0.1 10*3/uL (ref 0.0–0.5)
Eosinophils Relative: 3 %
HCT: 38.9 % (ref 36.0–46.0)
Hemoglobin: 12.9 g/dL (ref 12.0–15.0)
Immature Granulocytes: 0 %
Lymphocytes Relative: 32 %
Lymphs Abs: 1.6 10*3/uL (ref 0.7–4.0)
MCH: 31 pg (ref 26.0–34.0)
MCHC: 33.2 g/dL (ref 30.0–36.0)
MCV: 93.5 fL (ref 80.0–100.0)
Monocytes Absolute: 0.3 10*3/uL (ref 0.1–1.0)
Monocytes Relative: 6 %
Neutro Abs: 2.9 10*3/uL (ref 1.7–7.7)
Neutrophils Relative %: 58 %
Platelet Count: 251 10*3/uL (ref 150–400)
RBC: 4.16 MIL/uL (ref 3.87–5.11)
RDW: 12.4 % (ref 11.5–15.5)
WBC Count: 5 10*3/uL (ref 4.0–10.5)
nRBC: 0 % (ref 0.0–0.2)

## 2020-09-16 ENCOUNTER — Other Ambulatory Visit: Payer: Self-pay | Admitting: Adult Health

## 2020-09-16 DIAGNOSIS — Z853 Personal history of malignant neoplasm of breast: Secondary | ICD-10-CM

## 2020-09-21 ENCOUNTER — Other Ambulatory Visit: Payer: Self-pay

## 2020-09-21 ENCOUNTER — Ambulatory Visit
Admission: RE | Admit: 2020-09-21 | Discharge: 2020-09-21 | Disposition: A | Discharge status: Home / Self Care | Payer: BC Managed Care – PPO | Source: Ambulatory Visit | Attending: Adult Health | Admitting: Adult Health

## 2020-09-21 DIAGNOSIS — Z853 Personal history of malignant neoplasm of breast: Secondary | ICD-10-CM

## 2020-11-09 ENCOUNTER — Telehealth: Payer: Self-pay | Admitting: Hematology and Oncology

## 2020-11-09 ENCOUNTER — Encounter: Payer: Self-pay | Admitting: Hematology and Oncology

## 2020-11-09 NOTE — Telephone Encounter (Signed)
Scheduled appt per 8/3 sch msg. Pt aware.  

## 2020-11-25 ENCOUNTER — Other Ambulatory Visit: Payer: Self-pay | Admitting: Hematology and Oncology

## 2020-11-25 DIAGNOSIS — C50411 Malignant neoplasm of upper-outer quadrant of right female breast: Secondary | ICD-10-CM

## 2020-11-25 DIAGNOSIS — Z170 Estrogen receptor positive status [ER+]: Secondary | ICD-10-CM

## 2020-11-28 ENCOUNTER — Other Ambulatory Visit: Payer: BC Managed Care – PPO

## 2020-12-07 NOTE — Progress Notes (Signed)
HEMATOLOGY-ONCOLOGY [facility] VIDEO VISIT PROGRESS NOTE  I connected with Megan Acevedo on 12/09/2020 at 11:15 AM EDT by MyChart video conference and verified that I am speaking with the correct person using two identifiers.  I discussed the limitations, risks, security and privacy concerns of performing an evaluation and management service by MyChart and the availability of in person appointments.  I also discussed with the patient that there may be a patient responsible charge related to this service. The patient expressed understanding and agreed to proceed.  Patient's Location: Home Physician Location: Clinic  CHIEF COMPLIANT:  Follow-up of right breast cancer on tamoxifen, neratinib  INTERVAL HISTORY: Megan Acevedo is a 50 y.o. female with above-mentioned history of HER-2 positive right breast cancer who underwent a lumpectomy, adjuvant chemotherapy, radiation, Herceptin Perjeta maintenance, and is currently on antiestrogen therapy with tamoxifen and neratinib. Mammogram on 09/21/2020 showed no evidence of malignancy. She presents via MyChart today for follow-up.  Oncology History  Malignant neoplasm of upper-outer quadrant of right breast in female, estrogen receptor positive ([place])  06/26/2018 Initial Diagnosis   [place]: Screening mammogram detected focal asymmetry in the right breast, ultrasound revealed 2 cm oval mass: Biopsy revealed grade 1-2 invasive ductal carcinoma, ER 3+, PR 3+, HER-2 3+ positive T1c M0 stage Ia clinical stage   07/23/2018 Cancer Staging   Staging form: Breast, AJCC 8th Edition - Clinical stage from 07/23/2018: Stage IA (cT1c, cN0, cM0, G2, ER+, PR+, HER2+)   07/2018 Genetic Testing   Genetic testing in [place], [place], negative   08/20/2018 Surgery   Right lumpectomy Donne Hazel) [phone]): Grade 2 IDC with DCIS, 2.2 cm, margins negative, 1/1 lymph node positive with focal extranodal extension, ER 50%, PR 50%, HER-2 positive, 3+, Ki-67 10 to 15%, T2N1    09/03/2018 Cancer Staging   Staging form: Breast, AJCC 8th Edition - Pathologic: Stage IB (pT2, pN1a, cM0, G2, ER+, PR+, HER2+) - Signed by Gardenia Phlegm, NP on 09/03/2018   10/04/2018 - 05/22/2019 Chemotherapy   TCHP    - 03/23/2019 Radiation Therapy   Completed at [facility]   03/2019 - 03/2024 Anti-estrogen oral therapy   Tamoxifen   12/08/2020 Miscellaneous   Neratinib     Observations/Objective:  There were no vitals filed for this visit. There is no height or weight on file to calculate BMI.  I have reviewed the data as listed CMP Latest Ref Rng & Units 08/26/2020 05/25/2020 02/22/2020  Glucose 70 - 99 mg/dL 91 73 79  BUN 6 - 20 mg/dL '17 10 16  ' Creatinine 0.44 - 1.00 mg/dL 0.78 0.80 0.79  Sodium 135 - 145 mmol/L 142 140 141  Potassium 3.5 - 5.1 mmol/L 3.8 4.2 4.1  Chloride 98 - 111 mmol/L 108 110 108  CO2 22 - 32 mmol/L '26 24 26  ' Calcium 8.9 - 10.3 mg/dL 9.0 8.9 9.0  Total Protein 6.5 - 8.1 g/dL 7.0 7.2 7.2  Total Bilirubin 0.3 - 1.2 mg/dL 0.5 0.3 0.7  Alkaline Phos 38 - 126 U/L 79 84 77  AST 15 - 41 U/L '17 15 17  ' ALT 0 - 44 U/L '17 18 17    ' Lab Results  Component Value Date   WBC 5.0 08/26/2020   HGB 12.9 08/26/2020   HCT 38.9 08/26/2020   MCV 93.5 08/26/2020   PLT 251 08/26/2020   NEUTROABS 2.9 08/26/2020      Assessment Plan:  Malignant neoplasm of upper-outer quadrant of right breast in female, estrogen receptor positive ([place]) 08/20/2018:Right  lumpectomy: Grade 2 IDC with DCIS, 2.2 cm, margins negative, 1/1 lymph node positive with focal extranodal extension, ER 50%, PR 50%, HER-2 positive, 3+, Ki-67 10 to 15%, T2N1   Recommendation: 1.  Adjuvant chemotherapy with TCH Perjeta completed 01/07/2019 followed by Herceptin Perjeta maintenance 2.  Adjuvant radiation at [facility] completed 03/23/2019 3.  Adjuvant antiestrogen therapy with tamoxifen x10 years 4.  Adjuvant neratinib started 12/09/2019 completed  12/08/2020 ---------------------------------------------------------------------------------------------------------------------------------------- Current treatment: adjuvant Tamoxifen 10 mg daily with Neratinib (started August 2021) Echocardiogram 05/29/2019: EF 60 to 65% Arthralgias: Joint symptoms improved with lower dosage of tamoxifen. 5 mg bid is better.  Neratinib  toxicities: Severe diarrhea: Up to 5 tablets a day.  Able to manage with Imodium. Since neratinib is complete the symptoms should subside. Fear of cancer recurrence: Patient is struggling with worrying about recurrence of breast cancer.  I discussed with her about participating in the [facility] program.   Return to clinic in 1 year for follow-up.  I discussed the assessment and treatment plan with the patient. The patient was provided an opportunity to ask questions and all were answered. The patient agreed with the plan and demonstrated an understanding of the instructions. The patient was advised to call back or seek an in-person evaluation if the symptoms worsen or if the condition fails to improve as anticipated.   Total time spent: 20 minutes including face-to-face MyChart video visit time and time spent for planning, charting and coordination of care  Rulon Eisenmenger, MD 12/09/2020  I, Thana Ates am acting as scribe for Nicholas Lose, MD.  I have reviewed the above documentation for accuracy and completeness, and I agree with the above.

## 2020-12-09 ENCOUNTER — Inpatient Hospital Stay: Payer: BC Managed Care – PPO | Attending: Hematology and Oncology | Admitting: Hematology and Oncology

## 2020-12-09 DIAGNOSIS — Z170 Estrogen receptor positive status [ER+]: Secondary | ICD-10-CM | POA: Diagnosis not present

## 2020-12-09 DIAGNOSIS — C50411 Malignant neoplasm of upper-outer quadrant of right female breast: Secondary | ICD-10-CM | POA: Diagnosis not present

## 2020-12-09 NOTE — Assessment & Plan Note (Signed)
08/20/2018:Right lumpectomy: Grade 2 IDC with DCIS, 2.2 cm, margins negative, 1/1 lymph node positive with focal extranodal extension, ER 50%, PR 50%, HER-2 positive, 3+, Ki-67 10 to 15%, T2N1  Recommendation: 1.Adjuvant chemotherapy with TCH Perjeta completed 01/07/2019 followed by Herceptin Perjeta maintenance 2.Adjuvant radiation at [facility] completed 03/23/2019 3.Adjuvant antiestrogen therapy with tamoxifen x10 years 4.Adjuvant neratinib started 12/09/2019 completed 12/08/2020 ---------------------------------------------------------------------------------------------------------------------------------------- Current treatment:adjuvant Tamoxifen10 mg dailywith Neratinib (startedAugust2021) Echocardiogram2/19/2021: EF 60 to 65% Arthralgias:Joint symptoms improved with lower dosage of tamoxifen.  However she still feels stiffness in the right hand.  Therefore we decided to split the tamoxifen 10 mg dose into 2 half and then she will take one half in the morning and one half in the evening.  Neratinibtoxicities: Severe diarrhea: Up to 5 tablets a day.  Able to manage with Imodium.  Since neratinib is complete the symptoms should subside.  Return to clinic in 1 year for follow-up

## 2020-12-30 ENCOUNTER — Other Ambulatory Visit: Payer: Self-pay

## 2020-12-30 ENCOUNTER — Ambulatory Visit: Payer: Self-pay

## 2020-12-30 ENCOUNTER — Encounter: Payer: Self-pay | Admitting: Orthopaedic Surgery

## 2020-12-30 ENCOUNTER — Ambulatory Visit: Payer: BC Managed Care – PPO | Admitting: Orthopaedic Surgery

## 2020-12-30 DIAGNOSIS — M25561 Pain in right knee: Secondary | ICD-10-CM

## 2020-12-30 DIAGNOSIS — G8929 Other chronic pain: Secondary | ICD-10-CM | POA: Diagnosis not present

## 2020-12-30 MED ORDER — LIDOCAINE HCL 1 % IJ SOLN
2.0000 mL | INTRAMUSCULAR | Status: AC | PRN
  Administered 2020-12-30: 2 mL

## 2020-12-30 MED ORDER — METHYLPREDNISOLONE ACETATE 40 MG/ML IJ SUSP
40.0000 mg | INTRAMUSCULAR | Status: AC | PRN
  Administered 2020-12-30: 40 mg via INTRA_ARTICULAR

## 2020-12-30 MED ORDER — BUPIVACAINE HCL 0.5 % IJ SOLN
2.0000 mL | INTRAMUSCULAR | Status: AC | PRN
  Administered 2020-12-30: 2 mL via INTRA_ARTICULAR

## 2020-12-30 NOTE — Progress Notes (Signed)
Office Visit Note   Patient: Megan Acevedo           Date of Birth: 1970-04-18           MRN: [id number] Visit Date: 12/30/2020              Requested by: [name], MD No address on file PCP: [name], MD   Assessment & Plan: Visit Diagnoses:  1. Chronic pain of right knee     Plan: I impression is medial meniscal tear of the right knee.  She is not describing mechanical symptoms at this time.  My recommendation is cortisone injection and rest for a month and then gradually increase activity as tolerated.  If she is unable to do so or the relief from the injection is short-lived then we will need to obtain an MRI to rule out structural abnormalities.  Follow-Up Instructions: No follow-ups on file.   Orders:  Orders Placed This Encounter  Procedures   XR KNEE 3 VIEW RIGHT   No orders of the defined types were placed in this encounter.     Procedures: Large Joint Inj: R knee on 12/30/2020 8:58 PM Indications: pain Details: 22 G needle  Arthrogram: No  Medications: 40 mg methylPREDNISolone acetate 40 MG/ML; 2 mL lidocaine 1 %; 2 mL bupivacaine 0.5 % Consent was given by the patient. Patient was prepped and draped in the usual sterile fashion.      Clinical Data: No additional findings.   Subjective: Chief Complaint  Patient presents with   Right Knee - Pain    HPI  Megan Acevedo is a very pleasant 50 year old female who comes in for chronic right knee pain with pain and swelling.  Her pain is almost all ways on the medial side.  She had a fall directly on her knee about 3 months ago.  Overall this has been going on for years but after the fall and the pain has gotten much worse.  Denies any mechanical symptoms and has a little bit of swelling.  Driving long distance makes the pain worse.  Pain wakes her up at night.  Motrin and Tylenol helped temporarily.  Works as a Marine scientist at [employer].  Review of Systems  Constitutional: Negative.    HENT: Negative.    Eyes: Negative.   Respiratory: Negative.    Cardiovascular: Negative.   Endocrine: Negative.   Musculoskeletal: Negative.   Neurological: Negative.   Hematological: Negative.   Psychiatric/Behavioral: Negative.    All other systems reviewed and are negative.   Objective: Vital Signs: There were no vitals taken for this visit.  Physical Exam Vitals and nursing note reviewed.  Constitutional:      Appearance: She is well-developed.  Pulmonary:     Effort: Pulmonary effort is normal.  Skin:    General: Skin is warm.     Capillary Refill: Capillary refill takes less than 2 seconds.  Neurological:     Mental Status: She is alert and oriented to person, place, and time.  Psychiatric:        Behavior: Behavior normal.        Thought Content: Thought content normal.        Judgment: Judgment normal.    Ortho Exam  Right knee shows no joint effusion.  She has increased pain to the medial joint line with flexion past 100 degrees.  Pain at the medial joint line with McMurray testing.  Collaterals and cruciates are stable.  Specialty  Comments:  No specialty comments available.  Imaging: No results found.   PMFS History: Patient Active Problem List   Diagnosis Date Noted   Port-A-Cath in place 10/15/2018   Malignant neoplasm of upper-outer quadrant of right breast in female, estrogen receptor positive ([place]) 07/23/2018   Past Medical History:  Diagnosis Date   Breast cancer ([place]) 2020   Right Breast CA   Cancer ([place])    Personal history of chemotherapy 2020   Right Breast CA   Personal history of radiation therapy 2020   Right Breast CA    Family History  Problem Relation Age of Onset   Breast cancer Mother 19    Past Surgical History:  Procedure Laterality Date   ABDOMINAL HYSTERECTOMY     BREAST LUMPECTOMY Right 08/20/2018   BREAST LUMPECTOMY WITH RADIOACTIVE SEED AND SENTINEL LYMPH NODE BIOPSY Right 08/20/2018   Procedure: RIGHT BREAST  LUMPECTOMY WITH RADIOACTIVE SEED AND RIGHT AXILLARY SENTINEL LYMPH NODE BIOPSY;  Surgeon: [name], MD;  Location: [place];  Service: General;  Laterality: Right;   CHOLECYSTECTOMY     PORTACATH PLACEMENT Right 08/20/2018   Procedure: INSERTION PORT-A-CATH WITH ULTRASOUND;  Surgeon: [name], MD;  Location: [place];  Service: General;  Laterality: Right;   TUBAL LIGATION     Social History   Occupational History   Not on file  Tobacco Use   Smoking status: Never   Smokeless tobacco: Never  Vaping Use   Vaping Use: Never used  Substance and Sexual Activity   Alcohol use: Never   Drug use: Never   Sexual activity: Not on file

## 2021-02-08 ENCOUNTER — Telehealth: Payer: Self-pay

## 2021-02-08 ENCOUNTER — Other Ambulatory Visit: Payer: Self-pay

## 2021-02-08 DIAGNOSIS — C50411 Malignant neoplasm of upper-outer quadrant of right female breast: Secondary | ICD-10-CM

## 2021-02-08 DIAGNOSIS — Z170 Estrogen receptor positive status [ER+]: Secondary | ICD-10-CM

## 2021-02-08 MED ORDER — TAMOXIFEN CITRATE 10 MG PO TABS: 10.0000 mg | ORAL_TABLET | Freq: Every day | ORAL | 3 refills | Status: DC

## 2021-02-08 NOTE — Telephone Encounter (Signed)
Refill request received from [name] for pt Megan Acevedo.  I called pt to verify how she was taking this as I saw a note in the most recent encounter that she was taking it differently than refill request suggested.  Pt confirms she is taking 10mg  1 time per day.  Refill request will be sent to reflect this

## 2021-04-11 ENCOUNTER — Telehealth: Payer: Self-pay | Admitting: Orthopaedic Surgery

## 2021-04-11 NOTE — Telephone Encounter (Signed)
lmom for pt to return call to get sch for "small redden/painful knot on the outer side of left foot" per mychart message to see Dr. Erlinda Hong

## 2021-04-12 ENCOUNTER — Encounter: Payer: Self-pay | Admitting: Hematology and Oncology

## 2021-04-13 ENCOUNTER — Other Ambulatory Visit: Payer: Self-pay | Admitting: *Deleted

## 2021-04-13 DIAGNOSIS — C50411 Malignant neoplasm of upper-outer quadrant of right female breast: Secondary | ICD-10-CM

## 2021-04-13 NOTE — Progress Notes (Signed)
Per pt request RN successfully faxed [phone]) order for yearly mammogram to be preformed at Dr. Richarda Osmond office.

## 2021-04-21 ENCOUNTER — Ambulatory Visit: Payer: BC Managed Care – PPO | Admitting: Orthopaedic Surgery

## 2021-05-05 ENCOUNTER — Ambulatory Visit: Payer: BC Managed Care – PPO | Admitting: Orthopaedic Surgery

## 2021-05-05 ENCOUNTER — Ambulatory Visit: Payer: Self-pay

## 2021-05-05 ENCOUNTER — Other Ambulatory Visit: Payer: Self-pay

## 2021-05-05 VITALS — Ht 66.0 in | Wt 141.0 lb

## 2021-05-05 DIAGNOSIS — M79672 Pain in left foot: Secondary | ICD-10-CM | POA: Diagnosis not present

## 2021-05-05 NOTE — Progress Notes (Signed)
Office Visit Note   Patient: Megan Acevedo           Date of Birth: 01-29-1971           MRN: [id number] Visit Date: 05/05/2021              Requested by: [name], MD No address on file PCP: [name], MD   Assessment & Plan: Visit Diagnoses:  1. Pain in left foot     Plan: Impression is left foot pain at the attachment site of the peroneal tendon at the foot.  We have discussed placing her in a cam walker weightbearing as tolerated to help settle this down.  Have also recommended that she take the Mobic and start using topical Voltaren which she is agreeable to.  If her symptoms do not improve over the next 4 weeks or so, she will follow-up with [place] for recheck.  Call with concerns or questions in the meantime.  Follow-Up Instructions: Return if symptoms worsen or fail to improve.   Orders:  Orders Placed This Encounter  Procedures   XR Foot Complete Left   No orders of the defined types were placed in this encounter.     Procedures: No procedures performed   Clinical Data: No additional findings.   Subjective: Chief Complaint  Patient presents with   Left Foot - Pain    HPI patient is a pleasant 51 year old female who comes in today with left lateral foot pain for the past 2 months.  No known injury or change in activity.  The pain is described as a burning sensation worse with inversion of the ankle.  She was recently prescribed Mobic but she has not been taking.  Review of Systems as detailed in HPI.  All others reviewed and are negative.   Objective: Vital Signs: Ht 5\' 6"  (1.676 m)    Wt 141 lb (64 kg)    BMI 22.76 kg/m   Physical Exam well-developed well-nourished female no acute distress.  Alert and oriented x3.  Ortho Exam left foot exam shows no swelling and no skin changes.  She is moderately tender at the peroneal attachment at the foot.  She has increased pain with inversion of the ankle.  No pain along the posterior tibial  tendon.  She is neurovascular intact distally.  Specialty Comments:  No specialty comments available.  Imaging: No results found.   PMFS History: Patient Active Problem List   Diagnosis Date Noted   Port-A-Cath in place 10/15/2018   Malignant neoplasm of upper-outer quadrant of right breast in female, estrogen receptor positive ([place]) 07/23/2018   Past Medical History:  Diagnosis Date   Breast cancer ([place]) 2020   Right Breast CA   Cancer ([place])    Personal history of chemotherapy 2020   Right Breast CA   Personal history of radiation therapy 2020   Right Breast CA    Family History  Problem Relation Age of Onset   Breast cancer Mother 38    Past Surgical History:  Procedure Laterality Date   ABDOMINAL HYSTERECTOMY     BREAST LUMPECTOMY Right 08/20/2018   BREAST LUMPECTOMY WITH RADIOACTIVE SEED AND SENTINEL LYMPH NODE BIOPSY Right 08/20/2018   Procedure: RIGHT BREAST LUMPECTOMY WITH RADIOACTIVE SEED AND RIGHT AXILLARY SENTINEL LYMPH NODE BIOPSY;  Surgeon: [name], MD;  Location: [place];  Service: General;  Laterality: Right;   CHOLECYSTECTOMY     PORTACATH PLACEMENT Right 08/20/2018   Procedure: INSERTION  PORT-A-CATH WITH ULTRASOUND;  Surgeon: [name], MD;  Location: [place];  Service: General;  Laterality: Right;   TUBAL LIGATION     Social History   Occupational History   Not on file  Tobacco Use   Smoking status: Never   Smokeless tobacco: Never  Vaping Use   Vaping Use: Never used  Substance and Sexual Activity   Alcohol use: Never   Drug use: Never   Sexual activity: Not on file

## 2021-05-19 ENCOUNTER — Encounter: Payer: Self-pay | Admitting: Orthopaedic Surgery

## 2021-09-11 IMAGING — MG DIGITAL DIAGNOSTIC BILAT W/ TOMO W/ CAD
8 of 12 series · 9 of 32 positions shown · non-contrast
Comparison: Previous exam(s).

CLINICAL DATA: 48-year-old female s/p right lumpectomy with
chemotherapy and radiation in 8383. This is the patient's first post
treatment mammogram.

EXAM:
DIGITAL DIAGNOSTIC BILATERAL MAMMOGRAM WITH CAD AND TOMO

[R CC (1 of 2)]
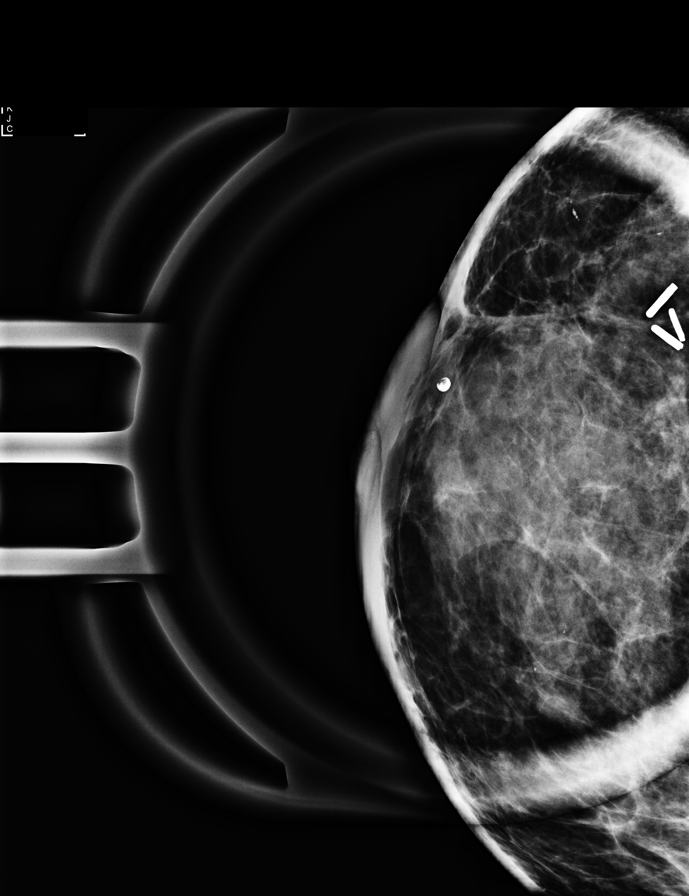

[R CC (2 of 2)]
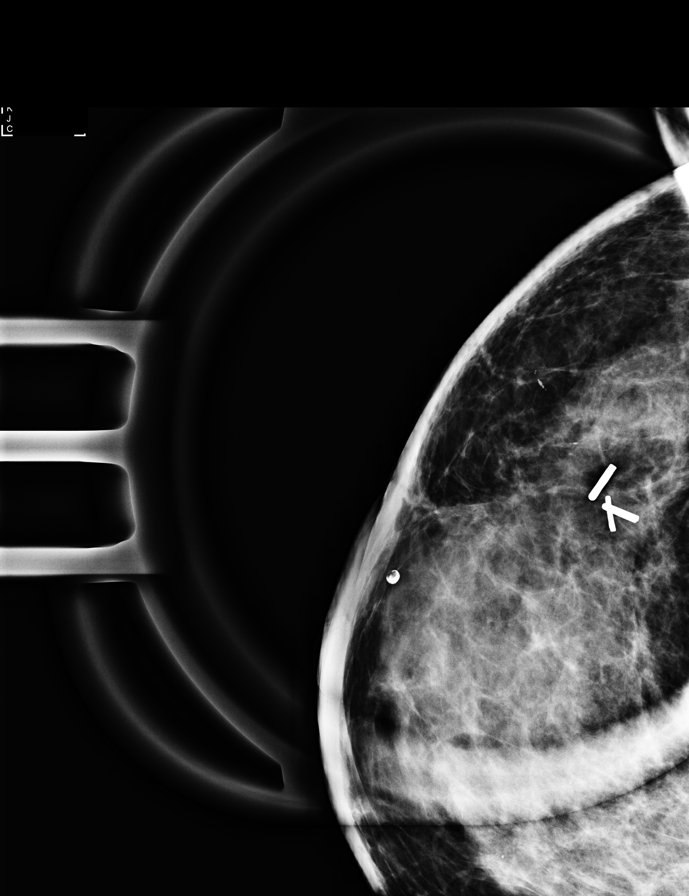

[R CC synth-2D]
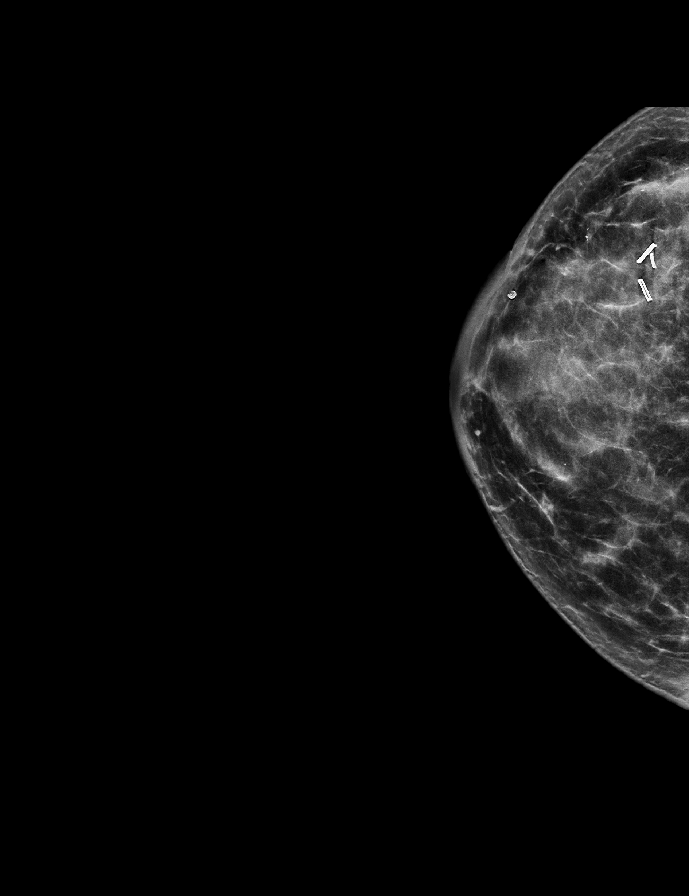

[R MLO synth-2D]
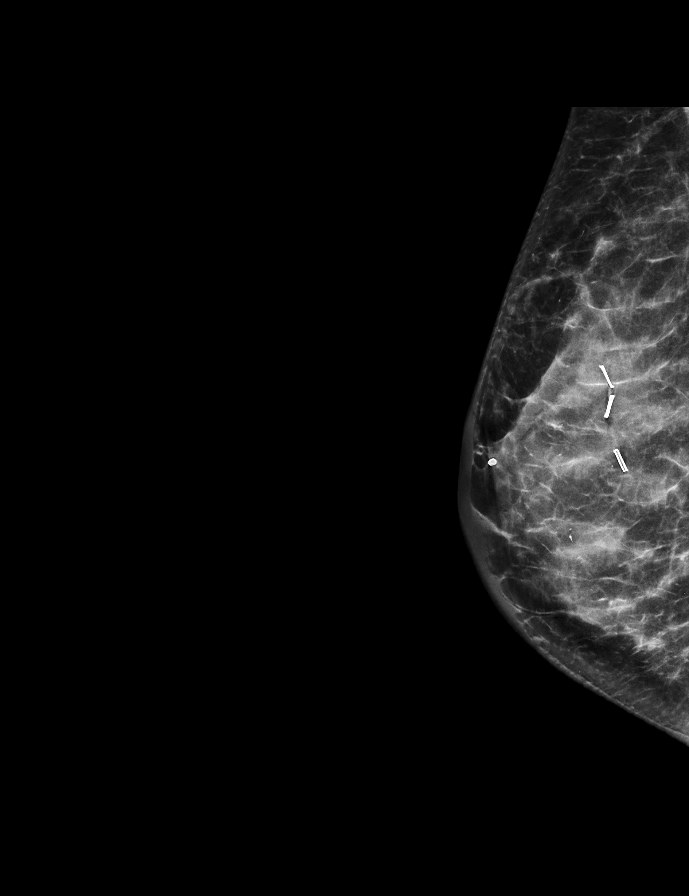

[L MLO synth-2D (1 of 2)]
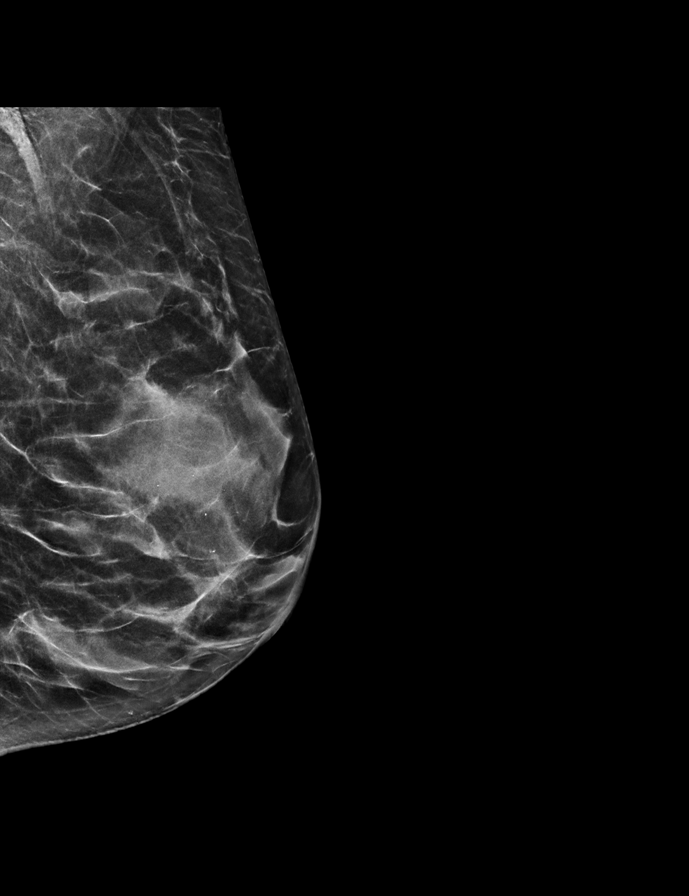

[L CC synth-2D]
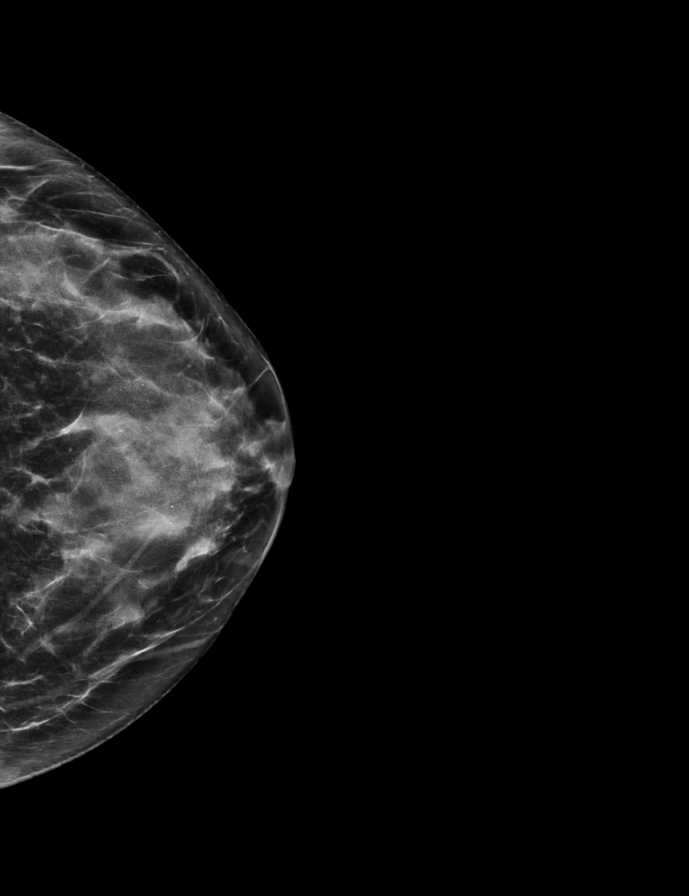

[L MLO synth-2D (2 of 2)]
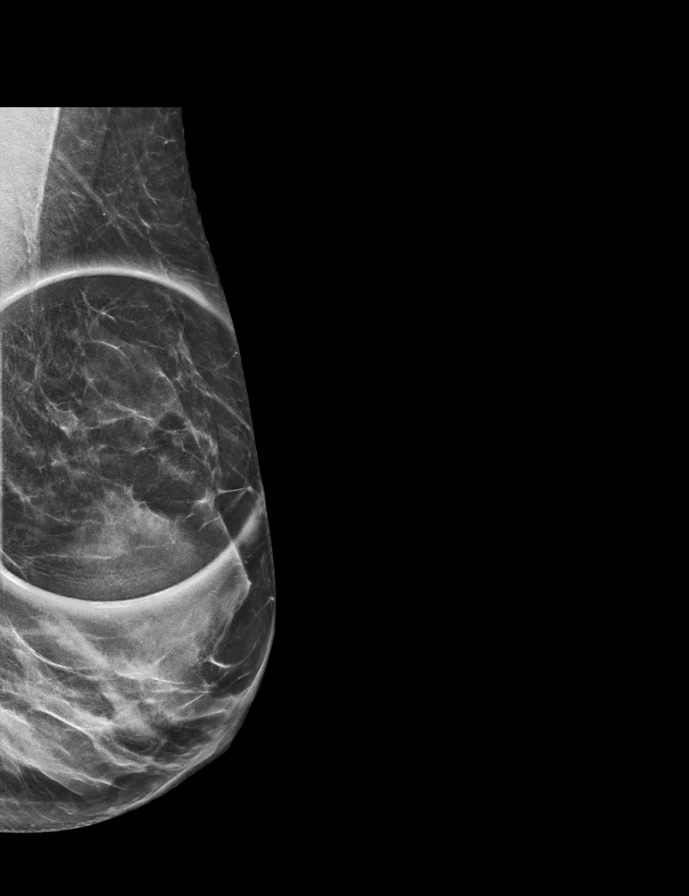

[R CC tomo · 2 of 62 frames shown]
[frame 21/62]
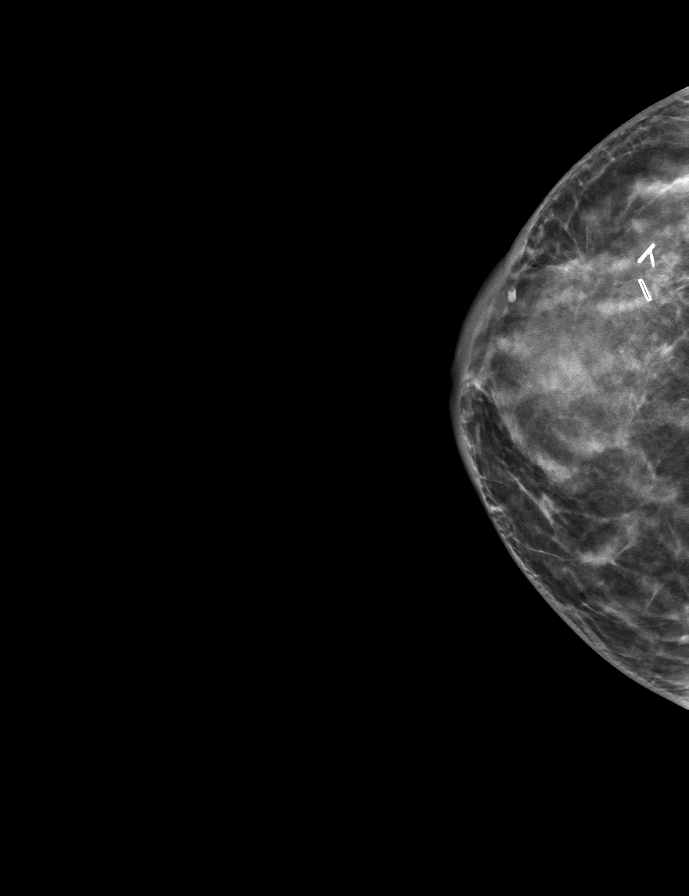
[frame 31/62]
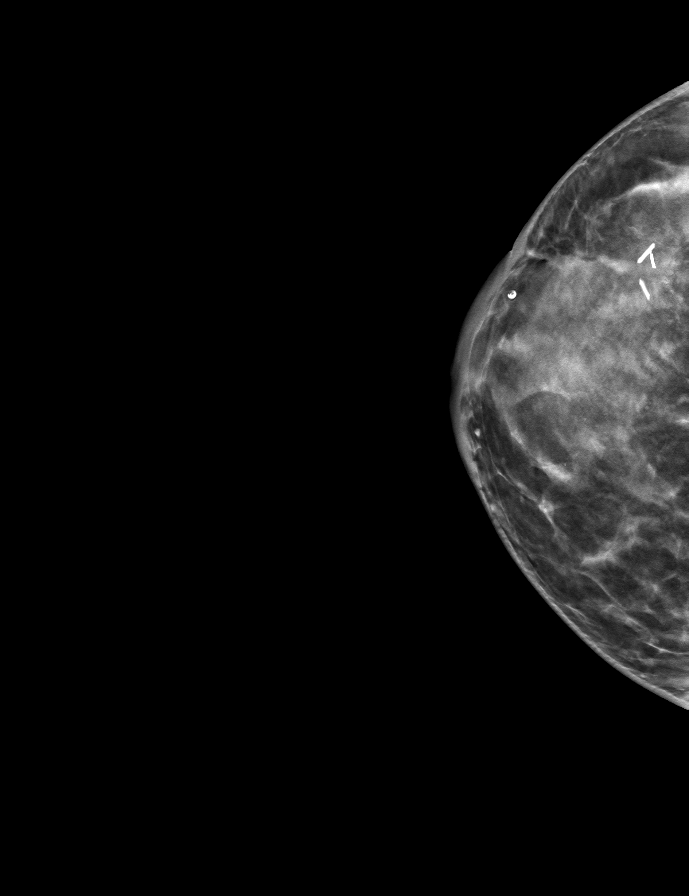

[9 of 32 positions shown; findings below may reference images not displayed]

ACR Breast Density Category c: The breast tissue is heterogeneously
dense, which may obscure small masses.
FINDINGS: Interval post lumpectomy changes are noted in the upper-outer
quadrant of the right breast. Diffuse right-sided skin and
trabecular thickening is consistent with radiation related changes.
No suspicious mammographic findings are identified in either breast.

Mammographic images were processed with CAD.
IMPRESSION: 1. No mammographic evidence of malignancy in either breast.
2. Interval right breast posttreatment changes.

RECOMMENDATION:
Diagnostic mammogram is suggested in 1 year. (Code:[id number])

I have discussed the findings and recommendations with the patient.
If applicable, a reminder letter will be sent to the patient
regarding the next appointment.

BI-RADS CATEGORY  2: Benign.

## 2021-10-16 ENCOUNTER — Encounter: Payer: Self-pay | Admitting: Hematology and Oncology

## 2021-10-17 ENCOUNTER — Other Ambulatory Visit: Payer: Self-pay | Admitting: *Deleted

## 2021-10-17 ENCOUNTER — Encounter: Payer: Self-pay | Admitting: Hematology and Oncology

## 2021-10-17 DIAGNOSIS — Z170 Estrogen receptor positive status [ER+]: Secondary | ICD-10-CM

## 2021-10-17 DIAGNOSIS — C50411 Malignant neoplasm of upper-outer quadrant of right female breast: Secondary | ICD-10-CM

## 2021-10-23 ENCOUNTER — Ambulatory Visit
Admission: RE | Admit: 2021-10-23 | Discharge: 2021-10-23 | Disposition: A | Discharge status: Home / Self Care | Payer: BC Managed Care – PPO | Source: Ambulatory Visit | Attending: Hematology and Oncology | Admitting: Hematology and Oncology

## 2021-10-23 DIAGNOSIS — C50411 Malignant neoplasm of upper-outer quadrant of right female breast: Secondary | ICD-10-CM

## 2021-10-26 ENCOUNTER — Encounter (INDEPENDENT_AMBULATORY_CARE_PROVIDER_SITE_OTHER): Payer: Self-pay | Admitting: *Deleted

## 2021-12-30 NOTE — Progress Notes (Signed)
Patient Care Team: Beola Cord, Sharon as PCP - General (Family Medicine) Mauro Kaufmann, RN as Oncology Nurse Navigator [name], RN as Oncology Nurse Navigator [name], MD as Consulting Physician (General Surgery) Nicholas Lose, MD as Consulting Physician (Hematology and Oncology)  DIAGNOSIS: No diagnosis found.  SUMMARY OF ONCOLOGIC HISTORY: Oncology History  Malignant neoplasm of upper-outer quadrant of right breast in female, estrogen receptor positive ([place])  06/26/2018 Initial Diagnosis   [place]: Screening mammogram detected focal asymmetry in the right breast, ultrasound revealed 2 cm oval mass: Biopsy revealed grade 1-2 invasive ductal carcinoma, ER 3+, PR 3+, HER-2 3+ positive T1c M0 stage Ia clinical stage   07/23/2018 Cancer Staging   Staging form: Breast, AJCC 8th Edition - Clinical stage from 07/23/2018: Stage IA (cT1c, cN0, cM0, G2, ER+, PR+, HER2+)   07/2018 Genetic Testing   Genetic testing in [place], [place], negative   08/20/2018 Surgery   Right lumpectomy Donne Hazel) [phone]): Grade 2 IDC with DCIS, 2.2 cm, margins negative, 1/1 lymph node positive with focal extranodal extension, ER 50%, PR 50%, HER-2 positive, 3+, Ki-67 10 to 15%, T2N1   09/03/2018 Cancer Staging   Staging form: Breast, AJCC 8th Edition - Pathologic: Stage IB (pT2, pN1a, cM0, G2, ER+, PR+, HER2+) - Signed by Gardenia Phlegm, NP on 09/03/2018   10/04/2018 - 05/22/2019 Chemotherapy   TCHP    - 03/23/2019 Radiation Therapy   Completed at [facility]   03/2019 - 03/2024 Anti-estrogen oral therapy   Tamoxifen   12/08/2020 Miscellaneous   Neratinib     CHIEF COMPLIANT: Follow-up of right breast cancer on tamoxifen, neratinib  INTERVAL HISTORY: Megan Acevedo is a y.o. female with above-mentioned history of HER-2 positive right breast cancer who underwent a lumpectomy, adjuvant chemotherapy, radiation, Herceptin Perjeta maintenance, and is currently on  antiestrogen therapy with tamoxifen and neratinib. She presents to the clinic for a follow-up.    ALLERGIES:  has No Known Allergies.  MEDICATIONS:  Current Outpatient Medications  Medication Sig Dispense Refill   chlorpheniramine (CHLOR-TRIMETON) 4 MG tablet Take 4 mg by mouth 2 (two) times daily as needed for allergies.     diphenoxylate-atropine (LOMOTIL) 2.5-0.025 MG tablet Take 1 tablet by mouth 4 (four) times daily as needed for diarrhea or loose stools. 30 tablet 2   loperamide (IMODIUM) 2 MG capsule Take 2 capsules (4 mg) by mouth three times daily on days 1-14, then 2 caps (4 mg) two times daily on days 15-56, then take as needed days 57-365 for diarrhea or loose stools 150 capsule 3   Multiple Vitamins-Minerals (CENTRUM SILVER 50+WOMEN) TABS Take 1 each by mouth every morning.     tamoxifen (NOLVADEX) 10 MG tablet Take 1 tablet (10 mg total) by mouth daily. Patient reports taking 10 mg one time daily 90 tablet 3   No current facility-administered medications for this visit.    PHYSICAL EXAMINATION: ECOG PERFORMANCE STATUS: {CHL ONC ECOG PS:[phone]}  There were no vitals filed for this visit. There were no vitals filed for this visit.  BREAST:*** No palpable masses or nodules in either right or left breasts. No palpable axillary supraclavicular or infraclavicular adenopathy no breast tenderness or nipple discharge. (exam performed in the presence of a chaperone)  LABORATORY DATA:  I have reviewed the data as listed    Latest Ref Rng & Units 08/26/2020    8:43 AM 05/25/2020    8:44 AM 02/22/2020    8:34 AM  CMP  Glucose 70 -  99 mg/dL 91  73  79   BUN 6 - 20 mg/dL '17  10  16   ' Creatinine 0.44 - 1.00 mg/dL 0.78  0.80  0.79   Sodium 135 - 145 mmol/L 142  140  141   Potassium 3.5 - 5.1 mmol/L 3.8  4.2  4.1   Chloride 98 - 111 mmol/L 108  110  108   CO2 22 - 32 mmol/L '26  24  26   ' Calcium 8.9 - 10.3 mg/dL 9.0  8.9  9.0   Total Protein 6.5 - 8.1 g/dL 7.0  7.2  7.2    Total Bilirubin 0.3 - 1.2 mg/dL 0.5  0.3  0.7   Alkaline Phos 38 - 126 U/L 79  84  77   AST 15 - 41 U/L '17  15  17   ' ALT 0 - 44 U/L '17  18  17     ' Lab Results  Component Value Date   WBC 5.0 08/26/2020   HGB 12.9 08/26/2020   HCT 38.9 08/26/2020   MCV 93.5 08/26/2020   PLT 251 08/26/2020   NEUTROABS 2.9 08/26/2020    ASSESSMENT & PLAN:  No problem-specific Assessment & Plan notes found for this encounter.    No orders of the defined types were placed in this encounter.  The patient has a good understanding of the overall plan. she agrees with it. she will call with any problems that may develop before the next visit here. Total time spent: 30 mins including face to face time and time spent for planning, charting and co-ordination of care   Suzzette Righter, [place] 12/30/21    I Gardiner Coins am scribing for Dr. Lindi Adie  ***

## 2022-01-02 ENCOUNTER — Other Ambulatory Visit: Payer: Self-pay

## 2022-01-02 ENCOUNTER — Inpatient Hospital Stay: Payer: BC Managed Care – PPO | Attending: Hematology and Oncology | Admitting: Hematology and Oncology

## 2022-01-02 DIAGNOSIS — Z9221 Personal history of antineoplastic chemotherapy: Secondary | ICD-10-CM | POA: Diagnosis not present

## 2022-01-02 DIAGNOSIS — Z79899 Other long term (current) drug therapy: Secondary | ICD-10-CM | POA: Insufficient documentation

## 2022-01-02 DIAGNOSIS — Z923 Personal history of irradiation: Secondary | ICD-10-CM | POA: Diagnosis not present

## 2022-01-02 DIAGNOSIS — Z79810 Long term (current) use of selective estrogen receptor modulators (SERMs): Secondary | ICD-10-CM | POA: Insufficient documentation

## 2022-01-02 DIAGNOSIS — Z170 Estrogen receptor positive status [ER+]: Secondary | ICD-10-CM | POA: Insufficient documentation

## 2022-01-02 DIAGNOSIS — C50411 Malignant neoplasm of upper-outer quadrant of right female breast: Secondary | ICD-10-CM | POA: Diagnosis present

## 2022-01-02 MED ORDER — BUPROPION HCL ER (XL) 150 MG PO TB24: 150.0000 mg | ORAL_TABLET | Freq: Every day | ORAL | Status: AC

## 2022-01-02 MED ORDER — TAMOXIFEN CITRATE 10 MG PO TABS: 10.0000 mg | ORAL_TABLET | Freq: Every day | ORAL | 3 refills | Status: DC

## 2022-01-02 MED ORDER — OYSTER SHELL CALCIUM/D3 500-5 MG-MCG PO TABS: 1.0000 | ORAL_TABLET | Freq: Two times a day (BID) | ORAL | Status: DC

## 2022-01-02 NOTE — Assessment & Plan Note (Signed)
08/20/2018:Right lumpectomy: Grade 2 IDC with DCIS, 2.2 cm, margins negative, 1/1 lymph node positive with focal extranodal extension, ER 50%, PR 50%, HER-2 positive, 3+, Ki-67 10 to 15%, T2N1  Recommendation: 1.Adjuvant chemotherapy with TCH Perjeta completed 01/07/2019 followed by Herceptin Perjeta maintenance 2.Adjuvant radiation at [facility] completed 03/23/2019 3.Adjuvant antiestrogen therapy with tamoxifen x10 years 4.Adjuvant neratinibstarted 12/09/2019 completed 12/08/2020 ---------------------------------------------------------------------------------------------------------------------------------------- Current treatment:adjuvant Tamoxifen5 mg dailywith Neratinib (startedAugust2021-August 2022) Echocardiogram2/19/2021: EF 60 to 65% Arthralgias:Joint symptoms improved with lower dosage of tamoxifen.5 mg bid is better.  Fear of cancer recurrence: Patient is struggling with worrying about recurrence of breast cancer. We discussed about Signatera testing Return to clinic in 1 year for follow-up.

## 2022-01-05 ENCOUNTER — Telehealth: Payer: Self-pay | Admitting: Hematology and Oncology

## 2022-01-05 NOTE — Telephone Encounter (Signed)
Scheduled appointment per 9/26 los. Patient is aware.

## 2022-04-17 ENCOUNTER — Encounter (INDEPENDENT_AMBULATORY_CARE_PROVIDER_SITE_OTHER): Payer: Self-pay | Admitting: *Deleted

## 2022-06-21 ENCOUNTER — Encounter: Payer: Self-pay | Admitting: Hematology and Oncology

## 2022-07-02 ENCOUNTER — Encounter (INDEPENDENT_AMBULATORY_CARE_PROVIDER_SITE_OTHER): Payer: Self-pay | Admitting: *Deleted

## 2022-07-02 ENCOUNTER — Encounter: Payer: Self-pay | Admitting: Hematology and Oncology

## 2022-08-06 ENCOUNTER — Telehealth (INDEPENDENT_AMBULATORY_CARE_PROVIDER_SITE_OTHER): Payer: Self-pay | Admitting: Gastroenterology

## 2022-08-06 NOTE — Telephone Encounter (Signed)
Any room Thanks 

## 2022-08-06 NOTE — Telephone Encounter (Signed)
Who is your primary care physician: Tacy Learn  Reasons for the colonoscopy: screening  Have you had a colonoscopy before?  no  Do you have family history of colon cancer? no  Previous colonoscopy with polyps removed? no  Do you have a history colorectal cancer?   no  Are you diabetic? If yes, Type 1 or Type 2?    no  Do you have a prosthetic or mechanical heart valve? no  Do you have a pacemaker/defibrillator?   no  Have you had endocarditis/atrial fibrillation? no  Have you had joint replacement within the last 12 months?  no  Do you tend to be constipated or have to use laxatives? no  Do you have any history of drugs or alchohol?  no  Do you use supplemental oxygen?  no  Have you had a stroke or heart attack within the last 6 months? no  Do you take weight loss medication?  no  For female patients: have you had a hysterectomy?  yes                                     are you post menopausal?       no                                            do you still have your menstrual cycle? no      Do you take any blood-thinning medications such as: (aspirin, warfarin, Plavix, Aggrenox)  no  If yes we need the name, milligram, dosage and who is prescribing doctor  Current Outpatient Medications on File Prior to Visit  Medication Sig Dispense Refill   buPROPion (WELLBUTRIN XL) 150 MG 24 hr tablet Take 1 tablet (150 mg total) by mouth daily.     calcium-vitamin D (OSCAL WITH D) 500-5 MG-MCG tablet Take 1 tablet by mouth 2 (two) times daily.     chlorpheniramine (CHLOR-TRIMETON) 4 MG tablet Take 4 mg by mouth 2 (two) times daily as needed for allergies.     Multiple Vitamins-Minerals (CENTRUM SILVER 50+WOMEN) TABS Take 1 each by mouth every morning.     tamoxifen (NOLVADEX) 10 MG tablet Take 1 tablet (10 mg total) by mouth daily. Patient reports taking 10 mg one time daily 90 tablet 3   [DISCONTINUED] prochlorperazine (COMPAZINE) 10 MG tablet Take 1 tablet (10 mg total) by  mouth every 6 (six) hours as needed (Nausea or vomiting). 30 tablet 1   No current facility-administered medications on file prior to visit.    No Known Allergies   Pharmacy: [facility] [place]  Primary Insurance Name: [employer]ne [employer]  Best number where you can be reached: [phone]

## 2022-08-08 NOTE — Telephone Encounter (Signed)
Left message to return call 

## 2022-08-14 MED ORDER — PEG 3350-KCL-NA BICARB-NACL 420 G PO SOLR: 4000.0000 mL | Freq: Once | ORAL | 0 refills | Status: DC

## 2022-08-14 NOTE — Addendum Note (Signed)
Addended by: Marlowe Shores on: 08/14/2022 11:36 AM   Modules accepted: Orders

## 2022-08-14 NOTE — Telephone Encounter (Signed)
Pt left message on voicemail returning call.  Returned call to patient and pt scheduled TCS for 10/02/22. Prep sent to pharmacy. Instructions sent via my chart.  Will need to complete PA

## 2022-08-15 ENCOUNTER — Encounter (INDEPENDENT_AMBULATORY_CARE_PROVIDER_SITE_OTHER): Payer: Self-pay | Admitting: *Deleted

## 2022-08-15 NOTE — Telephone Encounter (Signed)
PA approved via [employer]. Auth number [id number]. DOS 08/15/22-10/02/22

## 2022-08-15 NOTE — Telephone Encounter (Signed)
ref

## 2022-08-22 ENCOUNTER — Other Ambulatory Visit: Payer: Self-pay | Admitting: Hematology and Oncology

## 2022-08-30 ENCOUNTER — Other Ambulatory Visit: Payer: Self-pay | Admitting: Gynecology

## 2022-08-30 DIAGNOSIS — Z1231 Encounter for screening mammogram for malignant neoplasm of breast: Secondary | ICD-10-CM

## 2022-09-28 ENCOUNTER — Encounter (HOSPITAL_COMMUNITY): Payer: Self-pay

## 2022-09-28 ENCOUNTER — Encounter (HOSPITAL_COMMUNITY)
Admission: RE | Admit: 2022-09-28 | Discharge: 2022-09-28 | Disposition: A | Discharge status: Home / Self Care | Payer: No Typology Code available for payment source | Source: Ambulatory Visit | Attending: Gastroenterology | Admitting: Gastroenterology

## 2022-10-01 ENCOUNTER — Telehealth (INDEPENDENT_AMBULATORY_CARE_PROVIDER_SITE_OTHER): Payer: Self-pay | Admitting: Gastroenterology

## 2022-10-01 ENCOUNTER — Other Ambulatory Visit (INDEPENDENT_AMBULATORY_CARE_PROVIDER_SITE_OTHER): Payer: Self-pay

## 2022-10-01 MED ORDER — PEG 3350-KCL-NA BICARB-NACL 420 G PO SOLR: 4000.0000 mL | Freq: Once | ORAL | 0 refills | Status: AC

## 2022-10-01 NOTE — Telephone Encounter (Signed)
Contacted patient and pt states she was able to get her prep   ===View-only below this line===   ----- Message ----- From: Lillia Mountain, RN Sent: 09/28/2022   9:46 AM EDT To: Simone Curia; Marlowe Shores, LPN Subject: [facility] pharmacy has not recieved to RX f*  Linus Mako,  [facility] pharmacy has not received her RX for prep.  She uses [facility] in [place].   Thanks,  Cliffton Asters RN

## 2022-10-02 ENCOUNTER — Ambulatory Visit (HOSPITAL_COMMUNITY)
Admission: RE | Admit: 2022-10-02 | Discharge: 2022-10-02 | Disposition: A | Discharge status: Home / Self Care | Payer: No Typology Code available for payment source | Attending: Gastroenterology | Admitting: Gastroenterology

## 2022-10-02 ENCOUNTER — Ambulatory Visit (HOSPITAL_BASED_OUTPATIENT_CLINIC_OR_DEPARTMENT_OTHER): Payer: No Typology Code available for payment source | Admitting: Certified Registered"

## 2022-10-02 ENCOUNTER — Ambulatory Visit (HOSPITAL_COMMUNITY): Payer: No Typology Code available for payment source | Admitting: Certified Registered"

## 2022-10-02 ENCOUNTER — Encounter (HOSPITAL_COMMUNITY)
Admission: RE | Disposition: A | Discharge status: Home / Self Care | Payer: Self-pay | Source: Home / Self Care | Attending: Gastroenterology

## 2022-10-02 ENCOUNTER — Encounter (HOSPITAL_COMMUNITY): Payer: Self-pay | Admitting: Gastroenterology

## 2022-10-02 DIAGNOSIS — Z1211 Encounter for screening for malignant neoplasm of colon: Secondary | ICD-10-CM

## 2022-10-02 DIAGNOSIS — K635 Polyp of colon: Secondary | ICD-10-CM | POA: Diagnosis not present

## 2022-10-02 DIAGNOSIS — K649 Unspecified hemorrhoids: Secondary | ICD-10-CM

## 2022-10-02 DIAGNOSIS — Z08 Encounter for follow-up examination after completed treatment for malignant neoplasm: Secondary | ICD-10-CM | POA: Insufficient documentation

## 2022-10-02 DIAGNOSIS — D122 Benign neoplasm of ascending colon: Secondary | ICD-10-CM | POA: Insufficient documentation

## 2022-10-02 DIAGNOSIS — Z853 Personal history of malignant neoplasm of breast: Secondary | ICD-10-CM | POA: Diagnosis not present

## 2022-10-02 DIAGNOSIS — K648 Other hemorrhoids: Secondary | ICD-10-CM | POA: Diagnosis not present

## 2022-10-02 HISTORY — PX: POLYPECTOMY: SHX5525

## 2022-10-02 HISTORY — PX: COLONOSCOPY WITH PROPOFOL: SHX5780

## 2022-10-02 LAB — HM COLONOSCOPY

## 2022-10-02 SURGERY — COLONOSCOPY WITH PROPOFOL
Anesthesia: General

## 2022-10-02 MED ORDER — PROPOFOL 10 MG/ML IV BOLUS
INTRAVENOUS | Status: DC | PRN
  Administered 2022-10-02: 50 mg via INTRAVENOUS
  Administered 2022-10-02: 40 mg via INTRAVENOUS
  Administered 2022-10-02: 100 mg via INTRAVENOUS
  Administered 2022-10-02: 60 mg via INTRAVENOUS

## 2022-10-02 MED ORDER — LACTATED RINGERS IV SOLN: INTRAVENOUS | Status: DC

## 2022-10-02 MED ORDER — LIDOCAINE HCL (CARDIAC) PF 100 MG/5ML IV SOSY
PREFILLED_SYRINGE | INTRAVENOUS | Status: DC | PRN
  Administered 2022-10-02: 50 mg via INTRAVENOUS

## 2022-10-02 MED ORDER — PROPOFOL 500 MG/50ML IV EMUL
INTRAVENOUS | Status: DC | PRN
  Administered 2022-10-02: 150 ug/kg/min via INTRAVENOUS

## 2022-10-02 MED ORDER — DEXMEDETOMIDINE HCL IN NACL 80 MCG/20ML IV SOLN
INTRAVENOUS | Status: DC | PRN
  Administered 2022-10-02: 4 ug via INTRAVENOUS
  Administered 2022-10-02: 8 ug via INTRAVENOUS

## 2022-10-02 MED ORDER — DEXMEDETOMIDINE HCL IN NACL 80 MCG/20ML IV SOLN
INTRAVENOUS | Status: AC
  Filled 2022-10-02: qty 20

## 2022-10-02 NOTE — Transfer of Care (Signed)
Immediate Anesthesia Transfer of Care Note  Patient: Megan Acevedo  Procedure(s) Performed: COLONOSCOPY WITH PROPOFOL POLYPECTOMY  Patient Location: Short Stay  Anesthesia Type:General  Level of Consciousness: drowsy  Airway & Oxygen Therapy: Patient Spontanous Breathing  Post-op Assessment: Report given to RN and Post -op Vital signs reviewed and stable  Post vital signs: Reviewed and stable  Last Vitals:  Vitals Value Taken Time  BP    Temp    Pulse    Resp    SpO2      Last Pain:  Vitals:   10/02/22 1303  PainSc: 0-No pain         Complications: No notable events documented.

## 2022-10-02 NOTE — Discharge Instructions (Signed)
You are being discharged home Resume your previous diet Your physician has recommended a repeat colonoscopy in 10 years   

## 2022-10-02 NOTE — Progress Notes (Signed)
Changes in our Endo schedule, so patient was called to ask if she could come on and head to the hospital for her procedure.  Patient states that is fine and as soon as her husband is done getting ready they will head to [facility].  She's about a 30 minute drive away.

## 2022-10-02 NOTE — Anesthesia Preprocedure Evaluation (Signed)
Anesthesia Evaluation  Patient identified by MRN, date of birth, ID band Patient awake    Reviewed: Allergy & Precautions, H&P , NPO status , Patient's Chart, lab work & pertinent test results, reviewed documented beta blocker date and time   Airway Mallampati: II  TM Distance: >3 FB Neck ROM: full    Dental no notable dental hx.    Pulmonary neg pulmonary ROS   Pulmonary exam normal breath sounds clear to auscultation       Cardiovascular Exercise Tolerance: Good negative cardio ROS  Rhythm:regular Rate:Normal     Neuro/Psych negative neurological ROS  negative psych ROS   GI/Hepatic negative GI ROS, Neg liver ROS,,,  Endo/Other  negative endocrine ROS    Renal/GU negative Renal ROS  negative genitourinary   Musculoskeletal   Abdominal   Peds  Hematology negative hematology ROS (+)   Anesthesia Other Findings   Reproductive/Obstetrics negative OB ROS                             Anesthesia Physical Anesthesia Plan  ASA: 2  Anesthesia Plan: General   Post-op Pain Management:    Induction:   PONV Risk Score and Plan: Propofol infusion  Airway Management Planned:   Additional Equipment:   Intra-op Plan:   Post-operative Plan:   Informed Consent: I have reviewed the patients History and Physical, chart, labs and discussed the procedure including the risks, benefits and alternatives for the proposed anesthesia with the patient or authorized representative who has indicated his/her understanding and acceptance.     Dental Advisory Given  Plan Discussed with: CRNA  Anesthesia Plan Comments:        Anesthesia Quick Evaluation  

## 2022-10-02 NOTE — H&P (Signed)
Megan Acevedo is an 52 y.o. female.   Chief Complaint: CRC screening HPI: 52 y/o F with PMH breast cancer, coming for screening colonoscopy. The patient has never had a colonoscopy in the past.  The patient denies having any complaints such as melena, hematochezia, abdominal pain or distention, change in her bowel movement consistency or frequency, no changes in weight recently.  No family history of colorectal cancer.   Past Medical History:  Diagnosis Date   Breast cancer ([facility]) 2020   Right Breast CA   Cancer [facility])    Personal history of chemotherapy 2020   Right Breast CA   Personal history of radiation therapy 2020   Right Breast CA    Past Surgical History:  Procedure Laterality Date   ABDOMINAL HYSTERECTOMY     BREAST BIOPSY Right 06/26/2018   BREAST LUMPECTOMY Right 08/20/2018   BREAST LUMPECTOMY WITH RADIOACTIVE SEED AND SENTINEL LYMPH NODE BIOPSY Right 08/20/2018   Procedure: RIGHT BREAST LUMPECTOMY WITH RADIOACTIVE SEED AND RIGHT AXILLARY SENTINEL LYMPH NODE BIOPSY;  Surgeon: Emelia Loron, MD;  Location: [facility];  Service: General;  Laterality: Right;   CHOLECYSTECTOMY     PORTACATH PLACEMENT Right 08/20/2018   Procedure: INSERTION PORT-A-CATH WITH ULTRASOUND;  Surgeon: Emelia Loron, MD;  Location: [facility];  Service: General;  Laterality: Right;   TUBAL LIGATION      Family History  Problem Relation Age of Onset   Breast cancer Mother 16   Social History:  reports that she has never smoked. She has never used smokeless tobacco. She reports that she does not drink alcohol and does not use drugs.  Allergies: No Known Allergies  Medications Prior to Admission  Medication Sig Dispense Refill   acetaminophen (TYLENOL) 500 MG tablet Take 1,000 mg by mouth daily as needed for moderate pain.     buPROPion (WELLBUTRIN XL) 150 MG 24 hr tablet Take 1 tablet (150 mg total) by mouth daily.     Calcium Carb-Cholecalciferol (CALCIUM  600 + D PO) Take 1 tablet by mouth daily.     chlorpheniramine (CHLOR-TRIMETON) 4 MG tablet Take 4 mg by mouth 2 (two) times daily as needed for allergies.     clobetasol cream (TEMOVATE) 0.05 % Apply 1 Application topically 2 (two) times daily as needed (rash).     ibuprofen (ADVIL) 200 MG tablet Take 600 mg by mouth daily as needed for moderate pain.     lidocaine 4 % Place 1 patch onto the skin daily as needed (pain).     tamoxifen (NOLVADEX) 10 MG tablet Take 1 tablet (10 mg total) by mouth daily. Patient reports taking 10 mg one time daily 90 tablet 3    No results found for this or any previous visit (from the past 48 hour(s)). No results found.  Review of Systems  All other systems reviewed and are negative.   Blood pressure 130/84, pulse 85, temperature 98.1 F (36.7 C), resp. rate 18, SpO2 100 %. Physical Exam  GENERAL: The patient is AO x3, in no acute distress. HEENT: Head is normocephalic and atraumatic. EOMI are intact. Mouth is well hydrated and without lesions. NECK: Supple. No masses LUNGS: Clear to auscultation. No presence of rhonchi/wheezing/rales. Adequate chest expansion HEART: RRR, normal s1 and s2. ABDOMEN: Soft, nontender, no guarding, no peritoneal signs, and nondistended. BS +. No masses. EXTREMITIES: Without any cyanosis, clubbing, rash, lesions or edema. NEUROLOGIC: AOx3, no focal motor deficit. SKIN: no jaundice, no rashes  Assessment/Plan  52 y/o F with PMH breast cancer, coming for screening colonoscopy. coming for screening colonoscopy. The patient is at average risk for colorectal cancer.  We will proceed with colonoscopy today.  Dolores Frame, MD 10/02/2022, 10:55 AM

## 2022-10-02 NOTE — Anesthesia Procedure Notes (Signed)
Date/Time: 10/02/2022 1:11 PM  Performed by: Julian Reil, CRNAPre-anesthesia Checklist: Patient identified, Emergency Drugs available, Suction available and Patient being monitored Patient Re-evaluated:Patient Re-evaluated prior to induction Oxygen Delivery Method: Nasal cannula Induction Type: IV induction Placement Confirmation: positive ETCO2

## 2022-10-03 NOTE — Op Note (Addendum)
[facility] Patient Name: Megan Acevedo Procedure Date: 10/02/2022 12:52 PM MRN: [id number] Date of Birth: 1970-12-30 Attending MD: Katrinka Blazing , , [id number] CSN: [id number] Age: 52 Admit Type: Outpatient Procedure:                Colonoscopy Indications:              Screening for colorectal malignant neoplasm Providers:                Katrinka Blazing, Buel Ream. Thomasena Edis, RN, Durwin Glaze, Technician Referring MD:              Medicines:                Monitored Anesthesia Care Complications:            No immediate complications. Estimated Blood Loss:     Estimated blood loss: none. Procedure:                Pre-Anesthesia Assessment:                           - Prior to the procedure, a History and Physical                            was performed, and patient medications, allergies                            and sensitivities were reviewed. The patient's                            tolerance of previous anesthesia was reviewed.                           - The risks and benefits of the procedure and the                            sedation options and risks were discussed with the                            patient. All questions were answered and informed                            consent was obtained.                           - ASA Grade Assessment: I - A normal, healthy                            patient.                           After obtaining informed consent, the colonoscope                            was passed under direct vision. Throughout the  procedure, the patient's blood pressure, pulse, and                            oxygen saturations were monitored continuously. The                            [id number]-HQ190L ([id number]) scope was introduced through                            the anus and advanced to the the cecum, identified                            by appendiceal orifice and ileocecal valve. The                             colonoscopy was performed without difficulty. The                            patient tolerated the procedure well. The quality                            of the bowel preparation was good. Scope In: 1:07:14 PM Scope Out: 1:33:44 PM Scope Withdrawal Time: 0 hours 13 minutes 23 seconds  Total Procedure Duration: 0 hours 26 minutes 30 seconds  Findings:      The perianal and digital rectal examinations were normal.      A 4 mm polyp was found in the ascending colon. The polyp was sessile.       The polyp was removed with a cold snare. Resection was complete, but the       polyp tissue was not retrieved.      Non-bleeding internal hemorrhoids were found during retroflexion. The       hemorrhoids were small. Impression:               - One 4 mm polyp in the ascending colon, removed                            with a cold snare. Complete resection. Polyp tissue                            not retrieved.                           - Non-bleeding internal hemorrhoids. Moderate Sedation:      Per Anesthesia Care Recommendation:           - Discharge patient to home (ambulatory).                           - Resume previous diet.                           - Repeat colonoscopy in 10 years for screening                            purposes. Procedure Code(s):        ---  Professional ---                           [phone], Colonoscopy, flexible; with removal of                            tumor(s), polyp(s), or other lesion(s) by snare                            technique Diagnosis Code(s):        --- Professional ---                           Z12.11, Encounter for screening for malignant                            neoplasm of colon                           D12.2, Benign neoplasm of ascending colon                           K64.8, Other hemorrhoids CPT copyright 2022 [employer]. All rights reserved. The codes documented in this report are preliminary and upon  coder review may  be revised to meet current compliance requirements. Katrinka Blazing, MD Katrinka Blazing,  10/03/2022 8:08:46 AM This report has been signed electronically. Number of Addenda: 0

## 2022-10-04 NOTE — Anesthesia Postprocedure Evaluation (Signed)
Anesthesia Post Note  Patient: Megan Acevedo  Procedure(s) Performed: COLONOSCOPY WITH PROPOFOL POLYPECTOMY  Patient location during evaluation: Phase II Anesthesia Type: General Level of consciousness: awake Pain management: pain level controlled Vital Signs Assessment: post-procedure vital signs reviewed and stable Respiratory status: spontaneous breathing and respiratory function stable Cardiovascular status: blood pressure returned to baseline and stable Postop Assessment: no headache and no apparent nausea or vomiting Anesthetic complications: no Comments: Late entry   No notable events documented.   Last Vitals:  Vitals:   10/02/22 1336 10/02/22 1342  BP: (!) 96/50 98/68  Pulse: 77   Resp: 18   Temp: 36.6 C   SpO2: 100%     Last Pain:  Vitals:   10/02/22 1342  TempSrc:   PainSc: 0-No pain                 Windell Norfolk

## 2022-10-05 ENCOUNTER — Encounter (HOSPITAL_COMMUNITY): Payer: Self-pay | Admitting: Gastroenterology

## 2022-10-08 ENCOUNTER — Encounter (INDEPENDENT_AMBULATORY_CARE_PROVIDER_SITE_OTHER): Payer: Self-pay | Admitting: *Deleted

## 2022-11-19 ENCOUNTER — Ambulatory Visit
Admission: RE | Admit: 2022-11-19 | Discharge: 2022-11-19 | Disposition: A | Discharge status: Home / Self Care | Payer: No Typology Code available for payment source | Source: Ambulatory Visit | Attending: Gynecology | Admitting: Gynecology

## 2022-11-19 DIAGNOSIS — Z1231 Encounter for screening mammogram for malignant neoplasm of breast: Secondary | ICD-10-CM

## 2022-11-21 ENCOUNTER — Other Ambulatory Visit: Payer: Self-pay | Admitting: Gynecology

## 2022-11-21 DIAGNOSIS — R928 Other abnormal and inconclusive findings on diagnostic imaging of breast: Secondary | ICD-10-CM

## 2022-11-26 ENCOUNTER — Ambulatory Visit: Payer: No Typology Code available for payment source

## 2022-11-26 ENCOUNTER — Ambulatory Visit
Admission: RE | Admit: 2022-11-26 | Discharge: 2022-11-26 | Disposition: A | Discharge status: Home / Self Care | Payer: No Typology Code available for payment source | Source: Ambulatory Visit | Attending: Gynecology | Admitting: Gynecology

## 2022-11-26 DIAGNOSIS — R928 Other abnormal and inconclusive findings on diagnostic imaging of breast: Secondary | ICD-10-CM

## 2022-12-03 ENCOUNTER — Other Ambulatory Visit: Payer: No Typology Code available for payment source

## 2023-01-08 ENCOUNTER — Inpatient Hospital Stay
Payer: No Typology Code available for payment source | Attending: Hematology and Oncology | Admitting: Hematology and Oncology

## 2023-01-08 VITALS — BP 150/76 | HR 67 | Temp 97.8°F | Resp 18 | Ht 66.0 in | Wt 142.0 lb

## 2023-01-08 DIAGNOSIS — Z170 Estrogen receptor positive status [ER+]: Secondary | ICD-10-CM | POA: Diagnosis not present

## 2023-01-08 DIAGNOSIS — Z79810 Long term (current) use of selective estrogen receptor modulators (SERMs): Secondary | ICD-10-CM | POA: Insufficient documentation

## 2023-01-08 DIAGNOSIS — C50411 Malignant neoplasm of upper-outer quadrant of right female breast: Secondary | ICD-10-CM | POA: Diagnosis present

## 2023-01-08 DIAGNOSIS — Z923 Personal history of irradiation: Secondary | ICD-10-CM | POA: Insufficient documentation

## 2023-01-08 DIAGNOSIS — Z9221 Personal history of antineoplastic chemotherapy: Secondary | ICD-10-CM | POA: Diagnosis not present

## 2023-01-08 DIAGNOSIS — Z79899 Other long term (current) drug therapy: Secondary | ICD-10-CM | POA: Diagnosis not present

## 2023-01-08 MED ORDER — TAMOXIFEN CITRATE 10 MG PO TABS
10.0000 mg | ORAL_TABLET | Freq: Every day | ORAL | 3 refills | Status: DC
Start: 2023-01-08 — End: 2023-11-22

## 2023-01-08 NOTE — Assessment & Plan Note (Addendum)
08/20/2018:Right lumpectomy: Grade 2 IDC with DCIS, 2.2 cm, margins negative, 1/1 lymph node positive with focal extranodal extension, ER 50%, PR 50%, HER-2 positive, 3+, Ki-67 10 to 15%, T2N1   Recommendation: 1.  Adjuvant chemotherapy with TCH Perjeta completed 01/07/2019 followed by Herceptin Perjeta maintenance 2.  Adjuvant radiation at [facility] completed 03/23/2019 3.  Adjuvant antiestrogen therapy with tamoxifen x10 years 4.  Adjuvant neratinib started 12/09/2019 completed 12/08/2020 ---------------------------------------------------------------------------------------------------------------------------------------- Current treatment: adjuvant Tamoxifen 5 mg daily  Echocardiogram 05/29/2019: EF 60 to 65% Hot flashes are intermittent   Breast cancer surveillance: Mammograms bilateral 11/20/2022: Left breast possible asymmetry, density C.  Additional left breast mammogram: 11/26/2022: Benign  She and her significant other are hiking and enjoying outdoor activities.  Because of the hot flashes she could not do the [facility]. Return to clinic in 1 year for follow-up.

## 2023-01-08 NOTE — Progress Notes (Signed)
Patient Care Team: Tacy Learn, FNP as PCP - General (Family Medicine) Pershing Proud, RN as Oncology Nurse Navigator Donnelly Angelica, RN as Oncology Nurse Navigator Emelia Loron, MD as Consulting Physician (General Surgery) Serena Croissant, MD as Consulting Physician (Hematology and Oncology)  DIAGNOSIS:  Encounter Diagnosis  Name Primary?   Malignant neoplasm of upper-outer quadrant of right breast in female, estrogen receptor positive (HCC) Yes    SUMMARY OF ONCOLOGIC HISTORY: Oncology History  Malignant neoplasm of upper-outer quadrant of right breast in female, estrogen receptor positive (HCC)  06/26/2018 Initial Diagnosis   [place]: Screening mammogram detected focal asymmetry in the right breast, ultrasound revealed 2 cm oval mass: Biopsy revealed grade 1-2 invasive ductal carcinoma, ER 3+, PR 3+, HER-2 3+ positive T1c M0 stage Ia clinical stage   07/23/2018 Cancer Staging   Staging form: Breast, AJCC 8th Edition - Clinical stage from 07/23/2018: Stage IA (cT1c, cN0, cM0, G2, ER+, PR+, HER2+)   07/2018 Genetic Testing   Genetic testing in [place], [place], negative   08/20/2018 Surgery   Right lumpectomy Dwain Sarna) [phone]): Grade 2 IDC with DCIS, 2.2 cm, margins negative, 1/1 lymph node positive with focal extranodal extension, ER 50%, PR 50%, HER-2 positive, 3+, Ki-67 10 to 15%, T2N1   09/03/2018 Cancer Staging   Staging form: Breast, AJCC 8th Edition - Pathologic: Stage IB (pT2, pN1a, cM0, G2, ER+, PR+, HER2+) - Signed by Loa Socks, NP on 09/03/2018   10/04/2018 - 05/22/2019 Chemotherapy   TCHP    - 03/23/2019 Radiation Therapy   Completed at [facility]   03/2019 - 03/2024 Anti-estrogen oral therapy   Tamoxifen   12/08/2020 Miscellaneous   Neratinib     CHIEF COMPLIANT: Follow-up on tamoxifen   History of Present Illness   The patient, with a history of breast cancer, presents for a routine follow-up. She reports ongoing  joint stiffness, which she attributes to her daily tamoxifen regimen. Recently, she has also started experiencing hot flashes. The patient mentions a recent mammogram that required a repeat due to dense breast tissue, but the results were ultimately normal. Despite these symptoms, she maintains an active lifestyle, including hiking.         ALLERGIES:  has No Known Allergies.  MEDICATIONS:  Current Outpatient Medications  Medication Sig Dispense Refill   acetaminophen (TYLENOL) 500 MG tablet Take 1,000 mg by mouth daily as needed for moderate pain.     buPROPion (WELLBUTRIN XL) 150 MG 24 hr tablet Take 1 tablet (150 mg total) by mouth daily.     Calcium Carb-Cholecalciferol (CALCIUM 600 + D PO) Take 1 tablet by mouth daily.     chlorpheniramine (CHLOR-TRIMETON) 4 MG tablet Take 4 mg by mouth 2 (two) times daily as needed for allergies.     clobetasol cream (TEMOVATE) 0.05 % Apply 1 Application topically 2 (two) times daily as needed (rash).     ibuprofen (ADVIL) 200 MG tablet Take 600 mg by mouth daily as needed for moderate pain.     lidocaine 4 % Place 1 patch onto the skin daily as needed (pain).     tamoxifen (NOLVADEX) 10 MG tablet Take 1 tablet (10 mg total) by mouth daily. Patient reports taking 10 mg one time daily 90 tablet 3   No current facility-administered medications for this visit.    PHYSICAL EXAMINATION: ECOG PERFORMANCE STATUS: 1 - Symptomatic but completely ambulatory  Vitals:   01/08/23 1050  BP: (!) 150/76  Pulse: 67  Resp: 18  Temp: 97.8 F (36.6 C)  SpO2: 100%   Filed Weights   01/08/23 1050  Weight: 142 lb (64.4 kg)      LABORATORY DATA:  I have reviewed the data as listed    Latest Ref Rng & Units 08/26/2020    8:43 AM 05/25/2020    8:44 AM 02/22/2020    8:34 AM  CMP  Glucose 70 - 99 mg/dL 91  73  79   BUN 6 - 20 mg/dL 17  10  16    Creatinine 0.44 - 1.00 mg/dL 1.61  0.96  0.45   Sodium 135 - 145 mmol/L 142  140  141   Potassium 3.5 - 5.1  mmol/L 3.8  4.2  4.1   Chloride 98 - 111 mmol/L 108  110  108   CO2 22 - 32 mmol/L 26  24  26    Calcium 8.9 - 10.3 mg/dL 9.0  8.9  9.0   Total Protein 6.5 - 8.1 g/dL 7.0  7.2  7.2   Total Bilirubin 0.3 - 1.2 mg/dL 0.5  0.3  0.7   Alkaline Phos 38 - 126 U/L 79  84  77   AST 15 - 41 U/L 17  15  17    ALT 0 - 44 U/L 17  18  17      Lab Results  Component Value Date   WBC 5.0 08/26/2020   HGB 12.9 08/26/2020   HCT 38.9 08/26/2020   MCV 93.5 08/26/2020   PLT 251 08/26/2020   NEUTROABS 2.9 08/26/2020    ASSESSMENT & PLAN:  Malignant neoplasm of upper-outer quadrant of right breast in female, estrogen receptor positive (HCC) 08/20/2018:Right lumpectomy: Grade 2 IDC with DCIS, 2.2 cm, margins negative, 1/1 lymph node positive with focal extranodal extension, ER 50%, PR 50%, HER-2 positive, 3+, Ki-67 10 to 15%, T2N1   Recommendation: 1.  Adjuvant chemotherapy with TCH Perjeta completed 01/07/2019 followed by Herceptin Perjeta maintenance 2.  Adjuvant radiation at [facility] completed 03/23/2019 3.  Adjuvant antiestrogen therapy with tamoxifen x10 years 4.  Adjuvant neratinib started 12/09/2019 completed 12/08/2020 ---------------------------------------------------------------------------------------------------------------------------------------- Current treatment: adjuvant Tamoxifen 5 mg daily  Echocardiogram 05/29/2019: EF 60 to 65% Hot flashes are intermittent   Breast cancer surveillance: Mammograms bilateral 11/20/2022: Left breast possible asymmetry, density C.  Additional left breast mammogram: 11/26/2022: Benign  She and her significant other are hiking and enjoying outdoor activities.  Because of the hot flashes she could not do the [facility]. Return to clinic in 1 year for follow-up.    No orders of the defined types were placed in this encounter.  The patient has a good understanding of the overall plan. she agrees with it. she will call with any problems that may  develop before the next visit here. Total time spent: 30 mins including face to face time and time spent for planning, charting and co-ordination of care   Tamsen Meek, MD 01/08/23

## 2023-02-27 NOTE — Telephone Encounter (Signed)
Telephone call  

## 2023-06-19 ENCOUNTER — Encounter: Payer: Self-pay | Admitting: *Deleted

## 2023-06-19 NOTE — Progress Notes (Signed)
 Received call from Patsy with Dr. Sabino Gasser at [facility] Radiation department stating pt is scheduled for yearly f/u with their office tomorrow and requesting recent office not and mammogram results be faxed to [phone].  RN sucessfully faxed records.

## 2023-10-18 ENCOUNTER — Encounter: Payer: Self-pay | Admitting: Hematology and Oncology

## 2023-10-21 ENCOUNTER — Other Ambulatory Visit: Payer: Self-pay | Admitting: *Deleted

## 2023-10-21 DIAGNOSIS — C50411 Malignant neoplasm of upper-outer quadrant of right female breast: Secondary | ICD-10-CM

## 2023-11-20 ENCOUNTER — Ambulatory Visit
Admission: RE | Admit: 2023-11-20 | Discharge: 2023-11-20 | Disposition: A | Discharge status: Home / Self Care | Source: Ambulatory Visit | Attending: Hematology and Oncology | Admitting: Hematology and Oncology

## 2023-11-20 DIAGNOSIS — Z170 Estrogen receptor positive status [ER+]: Secondary | ICD-10-CM

## 2023-11-21 ENCOUNTER — Other Ambulatory Visit: Payer: Self-pay | Admitting: Hematology and Oncology

## 2023-11-21 DIAGNOSIS — Z170 Estrogen receptor positive status [ER+]: Secondary | ICD-10-CM

## 2023-12-31 ENCOUNTER — Inpatient Hospital Stay: Payer: Self-pay | Attending: Hematology and Oncology | Admitting: Hematology and Oncology

## 2023-12-31 VITALS — BP 130/78 | HR 76 | Temp 97.6°F | Resp 17 | Ht 66.0 in | Wt 142.0 lb

## 2023-12-31 DIAGNOSIS — Z170 Estrogen receptor positive status [ER+]: Secondary | ICD-10-CM | POA: Insufficient documentation

## 2023-12-31 DIAGNOSIS — Z800 Family history of malignant neoplasm of digestive organs: Secondary | ICD-10-CM | POA: Diagnosis not present

## 2023-12-31 DIAGNOSIS — Z9221 Personal history of antineoplastic chemotherapy: Secondary | ICD-10-CM | POA: Insufficient documentation

## 2023-12-31 DIAGNOSIS — Z923 Personal history of irradiation: Secondary | ICD-10-CM | POA: Insufficient documentation

## 2023-12-31 DIAGNOSIS — C50411 Malignant neoplasm of upper-outer quadrant of right female breast: Secondary | ICD-10-CM | POA: Diagnosis not present

## 2023-12-31 DIAGNOSIS — Z1721 Progesterone receptor positive status: Secondary | ICD-10-CM | POA: Diagnosis not present

## 2023-12-31 DIAGNOSIS — Z79899 Other long term (current) drug therapy: Secondary | ICD-10-CM | POA: Insufficient documentation

## 2023-12-31 DIAGNOSIS — Z79810 Long term (current) use of selective estrogen receptor modulators (SERMs): Secondary | ICD-10-CM | POA: Diagnosis not present

## 2023-12-31 DIAGNOSIS — Z1731 Human epidermal growth factor receptor 2 positive status: Secondary | ICD-10-CM | POA: Insufficient documentation

## 2023-12-31 DIAGNOSIS — Z803 Family history of malignant neoplasm of breast: Secondary | ICD-10-CM | POA: Diagnosis not present

## 2023-12-31 NOTE — Progress Notes (Signed)
 Patient Care Team: Erskine Neptune, FNP as PCP - General (Family Medicine) Tyree Nanetta SAILOR, RN as Oncology Nurse Navigator Ebbie Cough, MD as Consulting Physician (General Surgery) Odean Potts, MD as Consulting Physician (Hematology and Oncology)  DIAGNOSIS:  Encounter Diagnosis  Name Primary?   Malignant neoplasm of upper-outer quadrant of right breast in female, estrogen receptor positive (HCC) Yes    SUMMARY OF ONCOLOGIC HISTORY: Oncology History  Malignant neoplasm of upper-outer quadrant of right breast in female, estrogen receptor positive (HCC)  06/26/2018 Initial Diagnosis   [place] : Screening mammogram detected focal asymmetry in the right breast, ultrasound revealed 2 cm oval mass: Biopsy revealed grade 1-2 invasive ductal carcinoma, ER 3+, PR 3+, HER-2 3+ positive T1c M0 stage Ia clinical stage   07/23/2018 Cancer Staging   Staging form: Breast, AJCC 8th Edition - Clinical stage from 07/23/2018: Stage IA (cT1c, cN0, cM0, G2, ER+, PR+, HER2+)   07/2018 Genetic Testing   Genetic testing in [place], [place], negative   08/20/2018 Surgery   Right lumpectomy [name]): Grade 2 IDC with DCIS, 2.2 cm, margins negative, 1/1 lymph node positive with focal extranodal extension, ER 50%, PR 50%, HER-2 positive, 3+, Ki-67 10 to 15%, T2N1   09/03/2018 Cancer Staging   Staging form: Breast, AJCC 8th Edition - Pathologic: Stage IB (pT2, pN1a, cM0, G2, ER+, PR+, HER2+) - Signed by Crawford Morna Pickle, NP on 09/03/2018   10/04/2018 - 05/22/2019 Chemotherapy   TCHP    - 03/23/2019 Radiation Therapy   Completed at [facility]   03/2019 - 03/2024 Anti-estrogen oral therapy   Tamoxifen    12/08/2020 Miscellaneous   Neratinib      CHIEF COMPLIANT: Surveillance of breast cancer  HISTORY OF PRESENT ILLNESS:  History of Present Illness Megan Acevedo is a 53 year old female who presents for a follow-up visit.  She recently completed a mammogram on  August 14th. Her family history is significant for breast cancer, as her mother was diagnosed with it before later developing pancreatic cancer.  She is considering relocating to [place], [place] , in January due to her husband's new job, which may affect her insurance coverage by the second week of January.     ALLERGIES:  has no known allergies.  MEDICATIONS:  Current Outpatient Medications  Medication Sig Dispense Refill   acetaminophen  (TYLENOL ) 500 MG tablet Take 1,000 mg by mouth daily as needed for moderate pain.     buPROPion  (WELLBUTRIN  XL) 150 MG 24 hr tablet Take 1 tablet (150 mg total) by mouth daily.     Calcium  Carb-Cholecalciferol (CALCIUM  600 + D PO) Take 1 tablet by mouth daily.     chlorpheniramine (CHLOR-TRIMETON) 4 MG tablet Take 4 mg by mouth 2 (two) times daily as needed for allergies.     clobetasol cream (TEMOVATE) 0.05 % Apply 1 Application topically 2 (two) times daily as needed (rash).     ibuprofen (ADVIL) 200 MG tablet Take 600 mg by mouth daily as needed for moderate pain.     lidocaine  4 % Place 1 patch onto the skin daily as needed (pain).     tamoxifen  (NOLVADEX ) 10 MG tablet TAKE 1 TABLET BY MOUTH DAILY 90 tablet 3   No current facility-administered medications for this visit.    PHYSICAL EXAMINATION: ECOG PERFORMANCE STATUS: 1 - Symptomatic but completely ambulatory  Vitals:   12/31/23 0846  BP: 130/78  Pulse: 76  Resp: 17  Temp: 97.6 F (36.4 C)  SpO2: 100%   Filed Weights  12/31/23 0846  Weight: 142 lb (64.4 kg)    Physical Exam No palpable lumps nodules bilateral breasts or axilla  (exam performed in the presence of a chaperone)  LABORATORY DATA:  I have reviewed the data as listed    Latest Ref Rng & Units 08/26/2020    8:43 AM 05/25/2020    8:44 AM 02/22/2020    8:34 AM  CMP  Glucose 70 - 99 mg/dL 91  73  79   BUN 6 - 20 mg/dL 17  10  16    Creatinine 0.44 - 1.00 mg/dL 9.21  9.19  9.20   Sodium 135 - 145 mmol/L 142  140  141    Potassium 3.5 - 5.1 mmol/L 3.8  4.2  4.1   Chloride 98 - 111 mmol/L 108  110  108   CO2 22 - 32 mmol/L 26  24  26    Calcium  8.9 - 10.3 mg/dL 9.0  8.9  9.0   Total Protein 6.5 - 8.1 g/dL 7.0  7.2  7.2   Total Bilirubin 0.3 - 1.2 mg/dL 0.5  0.3  0.7   Alkaline Phos 38 - 126 U/L 79  84  77   AST 15 - 41 U/L 17  15  17    ALT 0 - 44 U/L 17  18  17      Lab Results  Component Value Date   WBC 5.0 08/26/2020   HGB 12.9 08/26/2020   HCT 38.9 08/26/2020   MCV 93.5 08/26/2020   PLT 251 08/26/2020   NEUTROABS 2.9 08/26/2020    ASSESSMENT & PLAN:  Malignant neoplasm of upper-outer quadrant of right breast in female, estrogen receptor positive (HCC) 08/20/2018:Right lumpectomy: Grade 2 IDC with DCIS, 2.2 cm, margins negative, 1/1 lymph node positive with focal extranodal extension, ER 50%, PR 50%, HER-2 positive, 3+, Ki-67 10 to 15%, T2N1   Recommendation: 1.  Adjuvant chemotherapy with TCH Perjeta  completed 01/07/2019 followed by Herceptin  Perjeta  maintenance 2.  Adjuvant radiation at [facility] completed 03/23/2019 3.  Adjuvant antiestrogen therapy with tamoxifen  x10 years 4.  Adjuvant neratinib  started 12/09/2019 completed 12/08/2020 ---------------------------------------------------------------------------------------------------------------------------------------- Current treatment: adjuvant Tamoxifen  5 mg daily started January 2021 Echocardiogram 05/29/2019: EF 60 to 65% Hot flashes are intermittent   Breast cancer surveillance: Mammograms bilateral 11/20/2023: Benign breast density category C   She and her significant other are hiking and enjoying outdoor activities.  Because of the hot flashes she could not do the [facility]. We recommended guardant reveal for MRD testing. Patient reports that she is moving towards [place] .  We will be happy to provide her documentation for transfer of care once she settles down in [place] .  ------------------------------------- Assessment  and Plan Assessment & Plan Estrogen receptor positive breast cancer Five years post-treatment with recent mammogram showing good results. Slightly high breast density noted, not actionable. - Provided brochure on GARDENT blood test for early detection. - Set up GARDENT blood test within 1-2 weeks with home blood draw. - Coordinated with new oncologist in [place]  for continued care and potential GARDENT testing.      No orders of the defined types were placed in this encounter.  The patient has a good understanding of the overall plan. she agrees with it. she will call with any problems that may develop before the next visit here. Total time spent: 30 mins including face to face time and time spent for planning, charting and co-ordination of care   Naomi MARLA Chad, MD 12/31/23

## 2023-12-31 NOTE — Assessment & Plan Note (Signed)
 08/20/2018:Right lumpectomy: Grade 2 IDC with DCIS, 2.2 cm, margins negative, 1/1 lymph node positive with focal extranodal extension, ER 50%, PR 50%, HER-2 positive, 3+, Ki-67 10 to 15%, T2N1   Recommendation: 1.  Adjuvant chemotherapy with [facility] Perjeta  completed 01/07/2019 followed by Herceptin  Perjeta  maintenance 2.  Adjuvant radiation at [facility] completed 03/23/2019 3.  Adjuvant antiestrogen therapy with tamoxifen  x10 years 4.  Adjuvant neratinib  started 12/09/2019 completed 12/08/2020 ---------------------------------------------------------------------------------------------------------------------------------------- Current treatment: adjuvant Tamoxifen  5 mg daily started January 2021 Echocardiogram 05/29/2019: EF 60 to 65% Hot flashes are intermittent   Breast cancer surveillance: Mammograms bilateral 11/20/2022: Left breast possible asymmetry, density C.  Additional left breast mammogram: 11/26/2022: Benign   She and her significant other are hiking and enjoying outdoor activities.  Because of the hot flashes she could not do the [facility]. Return to clinic in 1 year for follow-up.

## 2024-01-07 ENCOUNTER — Ambulatory Visit: Payer: Self-pay | Admitting: Hematology and Oncology

## 2024-01-08 ENCOUNTER — Ambulatory Visit: Payer: No Typology Code available for payment source | Admitting: Hematology and Oncology

## 2024-01-08 ENCOUNTER — Encounter: Payer: Self-pay | Admitting: *Deleted

## 2024-01-08 NOTE — Progress Notes (Signed)
 Guardant Reveal orders placed via the portal.

## 2024-01-22 ENCOUNTER — Encounter (INDEPENDENT_AMBULATORY_CARE_PROVIDER_SITE_OTHER): Payer: Self-pay | Admitting: Gastroenterology

## 2024-02-04 ENCOUNTER — Encounter: Payer: Self-pay | Admitting: *Deleted

## 2024-02-04 NOTE — Progress Notes (Signed)
 Received message from [facility] Reveal team stating patient was non responsive to mobile phlebotomy team.  Testing will be canceled at this time.  If pt wishes to proceed in the future, orders will be placed.
